# Patient Record
Sex: Female | Born: 1990 | Hispanic: Yes | Smoking: Never smoker
Health system: Southern US, Community
[De-identification: ages and names within clinical notes are randomized; demographics above are authoritative.]

## PROBLEM LIST (undated history)

## (undated) ENCOUNTER — Inpatient Hospital Stay (HOSPITAL_COMMUNITY): Payer: Self-pay

## (undated) ENCOUNTER — Emergency Department (HOSPITAL_BASED_OUTPATIENT_CLINIC_OR_DEPARTMENT_OTHER): Admission: EM | Payer: 59 | Source: Home / Self Care

## (undated) DIAGNOSIS — I10 Essential (primary) hypertension: Secondary | ICD-10-CM

## (undated) DIAGNOSIS — Z862 Personal history of diseases of the blood and blood-forming organs and certain disorders involving the immune mechanism: Secondary | ICD-10-CM

## (undated) DIAGNOSIS — D649 Anemia, unspecified: Secondary | ICD-10-CM

## (undated) DIAGNOSIS — Z8719 Personal history of other diseases of the digestive system: Secondary | ICD-10-CM

## (undated) DIAGNOSIS — Z9889 Other specified postprocedural states: Secondary | ICD-10-CM

## (undated) DIAGNOSIS — O009 Unspecified ectopic pregnancy without intrauterine pregnancy: Secondary | ICD-10-CM

## (undated) DIAGNOSIS — G44209 Tension-type headache, unspecified, not intractable: Secondary | ICD-10-CM

## (undated) HISTORY — PX: HERNIA REPAIR: SHX51

## (undated) HISTORY — DX: Essential (primary) hypertension: I10

## (undated) HISTORY — DX: Tension-type headache, unspecified, not intractable: G44.209

## (undated) HISTORY — PX: OTHER SURGICAL HISTORY: SHX169

## (undated) HISTORY — DX: Personal history of other diseases of the digestive system: Z87.19

## (undated) HISTORY — DX: Unspecified ectopic pregnancy without intrauterine pregnancy: O00.90

## (undated) HISTORY — DX: Personal history of diseases of the blood and blood-forming organs and certain disorders involving the immune mechanism: Z86.2

## (undated) HISTORY — PX: ABLATION ON ENDOMETRIOSIS: SHX5787

## (undated) HISTORY — PX: ENDOMETRIAL ABLATION: SHX621

## (undated) HISTORY — DX: Other specified postprocedural states: Z98.890

---

## 2011-01-20 NOTE — L&D Delivery Note (Signed)
Delivery Note At 5:47 AM a viable female was delivered via Vaginal, Spontaneous Delivery (Presentation: Left Occiput Anterior).  APGAR: 8, 8; weight: pending  Placenta status: Intact, Spontaneous.  Cord: 3 vessels with the following complications: None.    Anesthesia: Other  Episiotomy: None Lacerations: None Suture Repair: n/a Est. Blood Loss (mL): 300  Mom to postpartum.  Baby to nursery-stable.  Plans to breastfeed, desires BTL- aware that will not be done while inpatient unless platelets increase to >90.  Does not want circumcision.  Marge Duncans 10/18/2011, 6:19 AM

## 2011-03-30 LAB — ANTIBODY SCREEN: Antibody Screen: NEGATIVE

## 2011-03-30 LAB — CBC
HCT: 36 % (ref 36–46)
Hemoglobin: 12.4 g/dL (ref 12.0–16.0)

## 2011-03-30 LAB — ABO/RH: RH Type: POSITIVE

## 2011-03-30 LAB — GC/CHLAMYDIA PROBE AMP, GENITAL: Gonorrhea: NEGATIVE

## 2011-03-30 LAB — RUBELLA ANTIBODY, IGM: Rubella: IMMUNE

## 2011-03-30 LAB — CULTURE, OB URINE: Urine Culture, OB: NEGATIVE

## 2011-03-30 LAB — VARICELLA ZOSTER ANTIBODY, IGG: Varicella: IMMUNE

## 2011-03-30 LAB — GLUCOSE TOLERANCE, 1 HOUR: Glucose, 1 hour: 67

## 2011-03-30 LAB — HIV ANTIBODY (ROUTINE TESTING W REFLEX): HIV: NONREACTIVE

## 2011-03-30 LAB — HEPATITIS B SURFACE ANTIGEN: Hepatitis B Surface Ag: NEGATIVE

## 2011-04-07 DIAGNOSIS — O09219 Supervision of pregnancy with history of pre-term labor, unspecified trimester: Secondary | ICD-10-CM | POA: Insufficient documentation

## 2011-04-07 DIAGNOSIS — Z8751 Personal history of pre-term labor: Secondary | ICD-10-CM

## 2011-04-13 ENCOUNTER — Encounter: Payer: Self-pay | Admitting: Family

## 2011-04-13 ENCOUNTER — Ambulatory Visit (INDEPENDENT_AMBULATORY_CARE_PROVIDER_SITE_OTHER): Payer: Self-pay | Admitting: Family

## 2011-04-13 VITALS — BP 109/75 | Temp 97.9°F | Wt 123.6 lb

## 2011-04-13 DIAGNOSIS — Z8751 Personal history of pre-term labor: Secondary | ICD-10-CM

## 2011-04-13 DIAGNOSIS — O0990 Supervision of high risk pregnancy, unspecified, unspecified trimester: Secondary | ICD-10-CM | POA: Insufficient documentation

## 2011-04-13 DIAGNOSIS — O09219 Supervision of pregnancy with history of pre-term labor, unspecified trimester: Secondary | ICD-10-CM

## 2011-04-13 LAB — POCT URINALYSIS DIP (DEVICE)
Ketones, ur: NEGATIVE mg/dL
Leukocytes, UA: NEGATIVE
Nitrite: NEGATIVE
Protein, ur: NEGATIVE mg/dL
pH: 5.5 (ref 5.0–8.0)

## 2011-04-13 MED ORDER — HYDROXYPROGESTERONE CAPROATE 250 MG/ML IM OIL
250.0000 mg | TOPICAL_OIL | INTRAMUSCULAR | Status: AC
  Administered 2011-04-27 – 2011-09-14 (×21): 250 mg via INTRAMUSCULAR

## 2011-04-13 NOTE — Progress Notes (Signed)
Initial OB visit with our practice, transfer from [facility] Dept; initial OB labs were obtained, but not resulted; exam was completed (media tab); desires quad screen will obtain at next visit; reviewed 17p benefits, agrees to administration; next appt in 2 wks for 17p initiation and will schedule ultrasound.  Visit nutrition and social work today.

## 2011-04-13 NOTE — Progress Notes (Incomplete)
Nutrition Note:  (1st visit) Pt referral for 1st appt consult.  Dx. Hx of PTD (32 weeks) and LBW (4+5#). Pt has 3# wt gain at [redacted]w[redacted]d gestation which is adequate. Pt reports occasional nausea and vomiting, with a poor intake of 1 large meal daily and 2 snacks.  Pt cannot tolerate dairy products, eggs and fruits currently. Does eat meats and vegetables on a regular basis.  Disc wt gain goals of 25-35#. Pt plans to breastfeed, and does not want WIC services currently. Follow up in 4-6 weeks.  Cy Blamer, RD

## 2011-04-13 NOTE — Progress Notes (Signed)
Pain at times on left side. Pulse 94.

## 2011-04-27 ENCOUNTER — Ambulatory Visit (INDEPENDENT_AMBULATORY_CARE_PROVIDER_SITE_OTHER): Payer: Self-pay | Admitting: Obstetrics & Gynecology

## 2011-04-27 DIAGNOSIS — Z8751 Personal history of pre-term labor: Secondary | ICD-10-CM

## 2011-04-27 DIAGNOSIS — O09219 Supervision of pregnancy with history of pre-term labor, unspecified trimester: Secondary | ICD-10-CM

## 2011-04-27 LAB — POCT URINALYSIS DIP (DEVICE)
Glucose, UA: NEGATIVE mg/dL
Leukocytes, UA: NEGATIVE
Specific Gravity, Urine: 1.025 (ref 1.005–1.030)
Urobilinogen, UA: 0.2 mg/dL (ref 0.0–1.0)

## 2011-04-27 NOTE — Patient Instructions (Signed)
Tachycardia, Nonspecific  In adults, the heart normally beats between 60 and 100 times a minute. A heart rate over 100 is called tachycardia. When your heart beats too fast, it may not be able to pump enough blood to the rest of the body.  CAUSES    Exercise or exertion.   Fever.   Pain or injury.   Infection.   Loss of fluid (dehydration).   Overactive thyroid.   Lack of red blood cells (anemia).   Anxiety.   Alcohol.   Heart arrhythmia.   Caffeine.   Tobacco products.   Diet pills.   Street drugs.   Heart disease.  SYMPTOMS   Palpitations (rapid or irregular heartbeat).   Dizziness.   Tiredness (fatigue).   Shortness of breath.  DIAGNOSIS   After an exam and taking a history, your caregiver may order:   Blood tests.   Electrocardiogram (EKG).   Heart monitor.  TREATMENT   Treatment will depend on the cause and potential for harm. It may include:   Intravenous (IV) replacement of fluids or blood.   Antidote or reversal medicines.   Changes in your present medicines.   Lifestyle changes.  HOME CARE INSTRUCTIONS    Get rest.   Drink enough water and fluids to keep your urine clear or pale yellow.   Avoid:   Caffeine.   Nicotine.   Alcohol.   Stress.   Chocolate.   Stimulants.   Only take medicine as directed by your caregiver.  SEEK IMMEDIATE MEDICAL CARE IF:    You have pain in your chest, upper arms, jaw, or neck.   You become weak, dizzy, or feel faint.   You have palpitations that will not go away.   You throw up (vomit), have diarrhea, or pass blood.   You look pale and your skin is cool and wet.  MAKE SURE YOU:    Understand these instructions.   Will watch your condition.   Will get help right away if you are not doing well or get worse.  Document Released: 02/13/2004 Document Revised: 12/25/2010 Document Reviewed: 12/16/2010  Exit[employer] Patient Information 2012 [employer].

## 2011-04-27 NOTE — Progress Notes (Signed)
Start 17 p injections today. Sharp left side pain. Heart rate increased with meals, gets short of breath, was evaluated for this last pregnancy. Need records of previous eval, may need cards referral  Schedule [place] 2 weeks. Will do early 1 hr GTT

## 2011-04-27 NOTE — Progress Notes (Signed)
Addended by: Doreen Salvage on: 04/27/2011 11:21 AM   Modules accepted: Orders

## 2011-04-27 NOTE — Progress Notes (Signed)
Pt complains of abdominal pain

## 2011-04-28 LAB — GLUCOSE TOLERANCE, 1 HOUR: Glucose, 1 Hour GTT: 113 mg/dL (ref 70–140)

## 2011-05-04 ENCOUNTER — Ambulatory Visit (INDEPENDENT_AMBULATORY_CARE_PROVIDER_SITE_OTHER): Payer: Self-pay | Admitting: Obstetrics & Gynecology

## 2011-05-04 VITALS — BP 107/73 | Temp 99.1°F | Wt 126.4 lb

## 2011-05-04 DIAGNOSIS — Z8751 Personal history of pre-term labor: Secondary | ICD-10-CM

## 2011-05-04 DIAGNOSIS — O09219 Supervision of pregnancy with history of pre-term labor, unspecified trimester: Secondary | ICD-10-CM

## 2011-05-04 LAB — POCT URINALYSIS DIP (DEVICE)
Glucose, UA: NEGATIVE mg/dL
Leukocytes, UA: NEGATIVE
Nitrite: NEGATIVE
Specific Gravity, Urine: 1.02 (ref 1.005–1.030)
Urobilinogen, UA: 0.2 mg/dL (ref 0.0–1.0)

## 2011-05-04 NOTE — Progress Notes (Signed)
Continue 17P 

## 2011-05-04 NOTE — Patient Instructions (Signed)
Preventing Preterm Labor Preterm labor is when a pregnant woman has contractions that cause the cervix to open, shorten, and thin before 37 weeks of pregnancy. You will have regular contractions (tightening) 2 to 3 minutes apart. This usually causes discomfort or pain. HOME CARE  Eat a healthy diet.   Take your vitamins as told by your doctor.   Drink enough fluids to keep your pee (urine) clear or pale yellow every day.   Get rest and sleep.   Do not have sex if you are at high risk for preterm labor.   Follow your doctor's advice about activity, medicines, and tests.   Avoid stress.   Avoid hard labor or exercise that lasts for a long time.   Do not smoke.  GET HELP RIGHT AWAY IF:   You are having contractions.   You have belly (abdominal) pain.   You have bleeding from your vagina.   You have pain when you pee (urinate).   You have abnormal discharge from your vagina.   You have a temperature by mouth above 102 F (38.9 C).  MAKE SURE YOU:  Understand these instructions.   Will watch your condition.   Will get help if you are not doing well or get worse.  Document Released: 04/03/2008 Document Revised: 12/25/2010 Document Reviewed: 04/03/2008 [employer] Patient Information 2012 [employer]. 

## 2011-05-04 NOTE — Progress Notes (Signed)
Pulse: 90

## 2011-05-04 NOTE — Progress Notes (Signed)
U/S scheduled 05/11/11 at 730 am.

## 2011-05-11 ENCOUNTER — Ambulatory Visit (HOSPITAL_COMMUNITY)
Admission: RE | Admit: 2011-05-11 | Discharge: 2011-05-11 | Disposition: A | Discharge status: Home / Self Care | Payer: Medicaid Other | Source: Ambulatory Visit | Attending: Obstetrics & Gynecology | Admitting: Obstetrics & Gynecology

## 2011-05-11 ENCOUNTER — Ambulatory Visit (INDEPENDENT_AMBULATORY_CARE_PROVIDER_SITE_OTHER): Payer: Medicaid Other | Admitting: *Deleted

## 2011-05-11 VITALS — BP 103/66 | HR 88 | Temp 98.8°F | Ht 60.0 in | Wt 126.8 lb

## 2011-05-11 DIAGNOSIS — Z8751 Personal history of pre-term labor: Secondary | ICD-10-CM

## 2011-05-11 DIAGNOSIS — Z1389 Encounter for screening for other disorder: Secondary | ICD-10-CM | POA: Insufficient documentation

## 2011-05-11 DIAGNOSIS — O358XX0 Maternal care for other (suspected) fetal abnormality and damage, not applicable or unspecified: Secondary | ICD-10-CM | POA: Insufficient documentation

## 2011-05-11 DIAGNOSIS — Z363 Encounter for antenatal screening for malformations: Secondary | ICD-10-CM | POA: Insufficient documentation

## 2011-05-11 DIAGNOSIS — O09219 Supervision of pregnancy with history of pre-term labor, unspecified trimester: Secondary | ICD-10-CM

## 2011-05-12 ENCOUNTER — Encounter (HOSPITAL_COMMUNITY): Payer: Self-pay | Admitting: *Deleted

## 2011-05-12 ENCOUNTER — Inpatient Hospital Stay (HOSPITAL_COMMUNITY)
Admission: AD | Admit: 2011-05-12 | Discharge: 2011-05-12 | Disposition: A | Discharge status: Home / Self Care | Payer: Medicaid Other | Source: Ambulatory Visit | Attending: Obstetrics & Gynecology | Admitting: Obstetrics & Gynecology

## 2011-05-12 DIAGNOSIS — O99891 Other specified diseases and conditions complicating pregnancy: Secondary | ICD-10-CM | POA: Insufficient documentation

## 2011-05-12 DIAGNOSIS — R109 Unspecified abdominal pain: Secondary | ICD-10-CM | POA: Insufficient documentation

## 2011-05-12 DIAGNOSIS — M545 Low back pain, unspecified: Secondary | ICD-10-CM | POA: Insufficient documentation

## 2011-05-12 DIAGNOSIS — R197 Diarrhea, unspecified: Secondary | ICD-10-CM

## 2011-05-12 LAB — URINALYSIS, ROUTINE W REFLEX MICROSCOPIC
Bilirubin Urine: NEGATIVE
Glucose, UA: NEGATIVE mg/dL
Hgb urine dipstick: NEGATIVE
Ketones, ur: NEGATIVE mg/dL
Protein, ur: NEGATIVE mg/dL
Urobilinogen, UA: 0.2 mg/dL (ref 0.0–1.0)

## 2011-05-12 MED ORDER — DIPHENOXYLATE-ATROPINE 2.5-0.025 MG PO TABS
2.0000 | ORAL_TABLET | Freq: Once | ORAL | Status: AC
  Administered 2011-05-12: 1 via ORAL
  Filled 2011-05-12: qty 1

## 2011-05-12 MED ORDER — DIPHENOXYLATE-ATROPINE 2.5-0.025 MG PO TABS: 1.0000 | ORAL_TABLET | Freq: Four times a day (QID) | ORAL | Status: AC | PRN

## 2011-05-12 NOTE — MAU Note (Signed)
N. Frazier, CNM at bedside.  Assessment done and poc discussed with pt.  

## 2011-05-12 NOTE — MAU Note (Signed)
Only one tablet available in pyxis for lomotil.  Ok per CNM to give just one tablet and discharge home.

## 2011-05-12 NOTE — MAU Provider Note (Signed)
History     CSN: [id number]  Arrival date and time: 05/12/11 1827   First Provider Initiated Contact with Patient 05/12/11 2025      Chief Complaint  Patient presents with  . Back Pain   HPI 21 y.o. [id number] at [redacted]w[redacted]d c/o low back pain and low abd cramping starting yesterday with diarrhea, several episodes of diarrhea yesterday, 3 times this morning, none since. Not eating or drinking much today as she feels like it makes the diarrhea/cramping worse. No vaginal bleeding or discharge.    Past Medical History  Diagnosis Date  . Preterm labor     Past Surgical History  Procedure Date  . Hernia repair     Inguinal 21 yo    Family History  Problem Relation Age of Onset  . Cancer Maternal Grandmother     breast cancer  . Diabetes Maternal Grandfather     History  Substance Use Topics  . Smoking status: Never Smoker   . Smokeless tobacco: Never Used  . Alcohol Use: No    Allergies: No Known Allergies  Prescriptions prior to admission  Medication Sig Dispense Refill  . Prenatal Vit-Fe Fumarate-FA (PRENATAL MULTIVITAMIN) 60-1 MG tablet Take 1 tablet by mouth daily.        Review of Systems  Constitutional: Negative.   Respiratory: Negative.   Cardiovascular: Negative.   Gastrointestinal: Positive for abdominal pain and diarrhea. Negative for nausea, vomiting and constipation.  Genitourinary: Negative for dysuria, urgency, frequency, hematuria and flank pain.       Negative for vaginal bleeding, vaginal discharge  Musculoskeletal: Positive for back pain.  Neurological: Negative.   Psychiatric/Behavioral: Negative.    Physical Exam   Blood pressure 112/66, pulse 100, temperature 98.8 F (37.1 C), temperature source Oral, resp. rate 20, height 4\' 11"  (1.499 m), weight 126 lb (57.153 kg), last menstrual period 01/03/2011, SpO2 99.00%.  Physical Exam  Nursing note and vitals reviewed. Constitutional: She is oriented to person, place, and time. She appears  well-developed and well-nourished. No distress.  Respiratory: Effort normal.  GI: Soft. She exhibits no distension and no mass. There is no tenderness. There is no rebound, no guarding and no CVA tenderness.  Genitourinary:       SVE: long/thick/closed  Musculoskeletal: Normal range of motion.  Neurological: She is alert and oriented to person, place, and time.  Skin: Skin is warm and dry.  Psychiatric: She has a normal mood and affect.    MAU Course  Procedures  Results for orders placed during the hospital encounter of 05/12/11 (from the past 24 hour(s))  URINALYSIS, ROUTINE W REFLEX MICROSCOPIC     Status: Abnormal   Collection Time   05/12/11  7:30 PM      Component Value Range   Color, Urine YELLOW  YELLOW    APPearance CLEAR  CLEAR    Specific Gravity, Urine >1.030 (*) 1.005 - 1.030    pH 6.0  5.0 - 8.0    Glucose, UA NEGATIVE  NEGATIVE (mg/dL)   Hgb urine dipstick NEGATIVE  NEGATIVE    Bilirubin Urine NEGATIVE  NEGATIVE    Ketones, ur NEGATIVE  NEGATIVE (mg/dL)   Protein, ur NEGATIVE  NEGATIVE (mg/dL)   Urobilinogen, UA 0.2  0.0 - 1.0 (mg/dL)   Nitrite NEGATIVE  NEGATIVE    Leukocytes, UA NEGATIVE  NEGATIVE      Assessment and Plan  21 y.o. [id number] at [redacted]w[redacted]d Diarrhea - rx lomotil, encouraged PO hydration, BRAT diet F/U as scheduled or  sooner PRN  Haasini Patnaude 05/12/2011, 8:28 PM

## 2011-05-12 NOTE — MAU Note (Signed)
Pt states that lower back pain  Started yesterday that has gotten worse and worse-today-she feels the pain  In her lower abdomen-feels like cramping

## 2011-05-13 ENCOUNTER — Encounter (HOSPITAL_COMMUNITY): Payer: Self-pay | Admitting: Advanced Practice Midwife

## 2011-05-18 ENCOUNTER — Ambulatory Visit (INDEPENDENT_AMBULATORY_CARE_PROVIDER_SITE_OTHER): Payer: Medicaid Other

## 2011-05-18 VITALS — BP 115/75 | HR 90

## 2011-05-18 DIAGNOSIS — Z8751 Personal history of pre-term labor: Secondary | ICD-10-CM

## 2011-05-18 DIAGNOSIS — O09219 Supervision of pregnancy with history of pre-term labor, unspecified trimester: Secondary | ICD-10-CM

## 2011-05-25 ENCOUNTER — Encounter: Payer: Self-pay | Admitting: Family Medicine

## 2011-05-25 ENCOUNTER — Ambulatory Visit (INDEPENDENT_AMBULATORY_CARE_PROVIDER_SITE_OTHER): Payer: Medicaid Other | Admitting: Family Medicine

## 2011-05-25 DIAGNOSIS — O09219 Supervision of pregnancy with history of pre-term labor, unspecified trimester: Secondary | ICD-10-CM

## 2011-05-25 NOTE — Progress Notes (Signed)
Pulse: 86

## 2011-05-25 NOTE — Patient Instructions (Addendum)
Pregnancy - Second Trimester The second trimester of pregnancy (3 to 6 months) is a period of rapid growth for you and your baby. At the end of the sixth month, your baby is about 9 inches long and weighs 1 1/2 pounds. You will begin to feel the baby move between 18 and 20 weeks of the pregnancy. This is called quickening. Weight gain is faster. A clear fluid (colostrum) may leak out of your breasts. You may feel small contractions of the womb (uterus). This is known as false labor or Braxton-Hicks contractions. This is like a practice for labor when the baby is ready to be born. Usually, the problems with morning sickness have usually passed by the end of your first trimester. Some women develop small dark blotches (called cholasma, mask of pregnancy) on their face that usually goes away after the baby is born. Exposure to the sun makes the blotches worse. Acne may also develop in some pregnant women and pregnant women who have acne, may find that it goes away. PRENATAL EXAMS  Blood work may continue to be done during prenatal exams. These tests are done to check on your health and the probable health of your baby. Blood work is used to follow your blood levels (hemoglobin). Anemia (low hemoglobin) is common during pregnancy. Iron and vitamins are given to help prevent this. You will also be checked for diabetes between 24 and 28 weeks of the pregnancy. Some of the previous blood tests may be repeated.   The size of the uterus is measured during each visit. This is to make sure that the baby is continuing to grow properly according to the dates of the pregnancy.   Your blood pressure is checked every prenatal visit. This is to make sure you are not getting toxemia.   Your urine is checked to make sure you do not have an infection, diabetes or protein in the urine.   Your weight is checked often to make sure gains are happening at the suggested rate. This is to ensure that both you and your baby are  growing normally.   Sometimes, an ultrasound is performed to confirm the proper growth and development of the baby. This is a test which bounces harmless sound waves off the baby so your caregiver can more accurately determine due dates.  Sometimes, a specialized test is done on the amniotic fluid surrounding the baby. This test is called an amniocentesis. The amniotic fluid is obtained by sticking a needle into the belly (abdomen). This is done to check the chromosomes in instances where there is a concern about possible genetic problems with the baby. It is also sometimes done near the end of pregnancy if an early delivery is required. In this case, it is done to help make sure the baby's lungs are mature enough for the baby to live outside of the womb. CHANGES OCCURING IN THE SECOND TRIMESTER OF PREGNANCY Your body goes through many changes during pregnancy. They vary from person to person. Talk to your caregiver about changes you notice that you are concerned about.  During the second trimester, you will likely have an increase in your appetite. It is normal to have cravings for certain foods. This varies from person to person and pregnancy to pregnancy.   Your lower abdomen will begin to bulge.   You may have to urinate more often because the uterus and baby are pressing on your bladder. It is also common to get more bladder infections during pregnancy (  pain with urination). You can help this by drinking lots of fluids and emptying your bladder before and after intercourse.   You may begin to get stretch marks on your hips, abdomen, and breasts. These are normal changes in the body during pregnancy. There are no exercises or medications to take that prevent this change.   You may begin to develop swollen and bulging veins (varicose veins) in your legs. Wearing support hose, elevating your feet for 15 minutes, 3 to 4 times a day and limiting salt in your diet helps lessen the problem.    Heartburn may develop as the uterus grows and pushes up against the stomach. Antacids recommended by your caregiver helps with this problem. Also, eating smaller meals 4 to 5 times a day helps.   Constipation can be treated with a stool softener or adding bulk to your diet. Drinking lots of fluids, vegetables, fruits, and whole grains are helpful.   Exercising is also helpful. If you have been very active up until your pregnancy, most of these activities can be continued during your pregnancy. If you have been less active, it is helpful to start an exercise program such as walking.   Hemorrhoids (varicose veins in the rectum) may develop at the end of the second trimester. Warm sitz baths and hemorrhoid cream recommended by your caregiver helps hemorrhoid problems.   Backaches may develop during this time of your pregnancy. Avoid heavy lifting, wear low heal shoes and practice good posture to help with backache problems.   Some pregnant women develop tingling and numbness of their hand and fingers because of swelling and tightening of ligaments in the wrist (carpel tunnel syndrome). This goes away after the baby is born.   As your breasts enlarge, you may have to get a bigger bra. Get a comfortable, cotton, support bra. Do not get a nursing bra until the last month of the pregnancy if you will be nursing the baby.   You may get a dark line from your belly button to the pubic area called the linea nigra.   You may develop rosy cheeks because of increase blood flow to the face.   You may develop spider looking lines of the face, neck, arms and chest. These go away after the baby is born.  HOME CARE INSTRUCTIONS   It is extremely important to avoid all smoking, herbs, alcohol, and unprescribed drugs during your pregnancy. These chemicals affect the formation and growth of the baby. Avoid these chemicals throughout the pregnancy to ensure the delivery of a healthy infant.   Most of your home  care instructions are the same as suggested for the first trimester of your pregnancy. Keep your caregiver's appointments. Follow your caregiver's instructions regarding medication use, exercise and diet.   During pregnancy, you are providing food for you and your baby. Continue to eat regular, well-balanced meals. Choose foods such as meat, fish, milk and other low fat dairy products, vegetables, fruits, and whole-grain breads and cereals. Your caregiver will tell you of the ideal weight gain.   A physical sexual relationship may be continued up until near the end of pregnancy if there are no other problems. Problems could include early (premature) leaking of amniotic fluid from the membranes, vaginal bleeding, abdominal pain, or other medical or pregnancy problems.   Exercise regularly if there are no restrictions. Check with your caregiver if you are unsure of the safety of some of your exercises. The greatest weight gain will occur in the   last 2 trimesters of pregnancy. Exercise will help you:   Control your weight.   Get you in shape for labor and delivery.   Lose weight after you have the baby.   Wear a good support or jogging bra for breast tenderness during pregnancy. This may help if worn during sleep. Pads or tissues may be used in the bra if you are leaking colostrum.   Do not use hot tubs, steam rooms or saunas throughout the pregnancy.   Wear your seat belt at all times when driving. This protects you and your baby if you are in an accident.   Avoid raw meat, uncooked cheese, cat litter boxes and soil used by cats. These carry germs that can cause birth defects in the baby.   The second trimester is also a good time to visit your dentist for your dental health if this has not been done yet. Getting your teeth cleaned is OK. Use a soft toothbrush. Brush gently during pregnancy.   It is easier to loose urine during pregnancy. Tightening up and strengthening the pelvic muscles will  help with this problem. Practice stopping your urination while you are going to the bathroom. These are the same muscles you need to strengthen. It is also the muscles you would use as if you were trying to stop from passing gas. You can practice tightening these muscles up 10 times a set and repeating this about 3 times per day. Once you know what muscles to tighten up, do not perform these exercises during urination. It is more likely to contribute to an infection by backing up the urine.   Ask for help if you have financial, counseling or nutritional needs during pregnancy. Your caregiver will be able to offer counseling for these needs as well as refer you for other special needs.   Your skin may become oily. If so, wash your face with mild soap, use non-greasy moisturizer and oil or cream based makeup.  MEDICATIONS AND DRUG USE IN PREGNANCY  Take prenatal vitamins as directed. The vitamin should contain 1 milligram of folic acid. Keep all vitamins out of reach of children. Only a couple vitamins or tablets containing iron may be fatal to a baby or young child when ingested.   Avoid use of all medications, including herbs, over-the-counter medications, not prescribed or suggested by your caregiver. Only take over-the-counter or prescription medicines for pain, discomfort, or fever as directed by your caregiver. Do not use aspirin.   Let your caregiver also know about herbs you may be using.   Alcohol is related to a number of birth defects. This includes fetal alcohol syndrome. All alcohol, in any form, should be avoided completely. Smoking will cause low birth rate and premature babies.   Street or illegal drugs are very harmful to the baby. They are absolutely forbidden. A baby born to an addicted mother will be addicted at birth. The baby will go through the same withdrawal an adult does.  SEEK MEDICAL CARE IF:  You have any concerns or worries during your pregnancy. It is better to call with  your questions if you feel they cannot wait, rather than worry about them. SEEK IMMEDIATE MEDICAL CARE IF:   An unexplained oral temperature above 102 F (38.9 C) develops, or as your caregiver suggests.   You have leaking of fluid from the vagina (birth canal). If leaking membranes are suspected, take your temperature and tell your caregiver of this when you call.   There   is vaginal spotting, bleeding, or passing clots. Tell your caregiver of the amount and how many pads are used. Light spotting in pregnancy is common, especially following intercourse.   You develop a bad smelling vaginal discharge with a change in the color from clear to white.   You continue to feel sick to your stomach (nauseated) and have no relief from remedies suggested. You vomit blood or coffee ground-like materials.   You lose more than 2 pounds of weight or gain more than 2 pounds of weight over 1 week, or as suggested by your caregiver.   You notice swelling of your face, hands, feet, or legs.   You get exposed to German measles and have never had them.   You are exposed to fifth disease or chickenpox.   You develop belly (abdominal) pain. Round ligament discomfort is a common non-cancerous (benign) cause of abdominal pain in pregnancy. Your caregiver still must evaluate you.   You develop a bad headache that does not go away.   You develop fever, diarrhea, pain with urination, or shortness of breath.   You develop visual problems, blurry, or double vision.   You fall or are in a car accident or any kind of trauma.   There is mental or physical violence at home.  Document Released: 12/30/2000 Document Revised: 12/25/2010 Document Reviewed: 07/04/2008 [employer] Patient Information 2012 [employer]. Breastfeeding BENEFITS OF BREASTFEEDING For the baby  The first milk (colostrum) helps the baby's digestive system function better.   There are antibodies from the mother in the milk that help the  baby fight off infections.   The baby has a lower incidence of asthma, allergies, and SIDS (sudden infant death syndrome).   The nutrients in breast milk are better than formulas for the baby and helps the baby's brain grow better.   Babies who breastfeed have less gas, colic, and constipation.  For the mother  Breastfeeding helps develop a very special bond between mother and baby.   It is more convenient, always available at the correct temperature and cheaper than formula feeding.   It burns calories in the mother and helps with losing weight that was gained during pregnancy.   It makes the uterus contract back down to normal size faster and slows bleeding following delivery.   Breastfeeding mothers have a lower risk of developing breast cancer.  NURSE FREQUENTLY  A healthy, full-term baby may breastfeed as often as every hour or space his or her feedings to every 3 hours.   How often to nurse will vary from baby to baby. Watch your baby for signs of hunger, not the clock.   Nurse as often as the baby requests, or when you feel the need to reduce the fullness of your breasts.   Awaken the baby if it has been 3 to 4 hours since the last feeding.   Frequent feeding will help the mother make more milk and will prevent problems like sore nipples and engorgement of the breasts.  BABY'S POSITION AT THE BREAST  Whether lying down or sitting, be sure that the baby's tummy is facing your tummy.   Support the breast with 4 fingers underneath the breast and the thumb above. Make sure your fingers are well away from the nipple and baby's mouth.   Stroke the baby's lips and cheek closest to the breast gently with your finger or nipple.   When the baby's mouth is open wide enough, place all of your nipple and as much   of the dark area around the nipple as possible into your baby's mouth.   Pull the baby in close so the tip of the nose and the baby's cheeks touch the breast during the  feeding.  FEEDINGS  The length of each feeding varies from baby to baby and from feeding to feeding.   The baby must suck about 2 to 3 minutes for your milk to get to him or her. This is called a "let down." For this reason, allow the baby to feed on each breast as long as he or she wants. Your baby will end the feeding when he or she has received the right balance of nutrients.   To break the suction, put your finger into the corner of the baby's mouth and slide it between his or her gums before removing your breast from his or her mouth. This will help prevent sore nipples.  REDUCING BREAST ENGORGEMENT  In the first week after your baby is born, you may experience signs of breast engorgement. When breasts are engorged, they feel heavy, warm, full, and may be tender to the touch. You can reduce engorgement if you:   Nurse frequently, every 2 to 3 hours. Mothers who breastfeed early and often have fewer problems with engorgement.   Place light ice packs on your breasts between feedings. This reduces swelling. Wrap the ice packs in a lightweight towel to protect your skin.   Apply moist hot packs to your breast for 5 to 10 minutes before each feeding. This increases circulation and helps the milk flow.   Gently massage your breast before and during the feeding.   Make sure that the baby empties at least one breast at every feeding before switching sides.   Use a breast pump to empty the breasts if your baby is sleepy or not nursing well. You may also want to pump if you are returning to work or or you feel you are getting engorged.   Avoid bottle feeds, pacifiers or supplemental feedings of water or juice in place of breastfeeding.   Be sure the baby is latched on and positioned properly while breastfeeding.   Prevent fatigue, stress, and anemia.   Wear a supportive bra, avoiding underwire styles.   Eat a balanced diet with enough fluids.  If you follow these suggestions, your  engorgement should improve in 24 to 48 hours. If you are still experiencing difficulty, call your lactation consultant or caregiver. IS MY BABY GETTING ENOUGH MILK? Sometimes, mothers worry about whether their babies are getting enough milk. You can be assured that your baby is getting enough milk if:  The baby is actively sucking and you hear swallowing.   The baby nurses at least 8 to 12 times in a 24 hour time period. Nurse your baby until he or she unlatches or falls asleep at the first breast (at least 10 to 20 minutes), then offer the second side.   The baby is wetting 5 to 6 disposable diapers (6 to 8 cloth diapers) in a 24 hour period by 5 to 6 days of age.   The baby is having at least 2 to 3 stools every 24 hours for the first few months. Breast milk is all the food your baby needs. It is not necessary for your baby to have water or formula. In fact, to help your breasts make more milk, it is best not to give your baby supplemental feedings during the early weeks.   The stool should   be soft and yellow.   The baby should gain 4 to 7 ounces per week after he is 4 days old.  TAKE CARE OF YOURSELF Take care of your breasts by:  Bathing or showering daily.   Avoiding the use of soaps on your nipples.   Start feedings on your left breast at one feeding and on your right breast at the next feeding.   You will notice an increase in your milk supply 2 to 5 days after delivery. You may feel some discomfort from engorgement, which makes your breasts very firm and often tender. Engorgement "peaks" out within 24 to 48 hours. In the meantime, apply warm moist towels to your breasts for 5 to 10 minutes before feeding. Gentle massage and expression of some milk before feeding will soften your breasts, making it easier for your baby to latch on. Wear a well fitting nursing bra and air dry your nipples for 10 to 15 minutes after each feeding.   Only use cotton bra pads.   Only use pure lanolin on  your nipples after nursing. You do not need to wash it off before nursing.  Take care of yourself by:   Eating well-balanced meals and nutritious snacks.   Drinking milk, fruit juice, and water to satisfy your thirst (about 8 glasses a day).   Getting plenty of rest.   Increasing calcium in your diet (1200 mg a day).   Avoiding foods that you notice affect the baby in a bad way.  SEEK MEDICAL CARE IF:   You have any questions or difficulty with breastfeeding.   You need help.   You have a hard, red, sore area on your breast, accompanied by a fever of 100.5 F (38.1 C) or more.   Your baby is too sleepy to eat well or is having trouble sleeping.   Your baby is wetting less than 6 diapers per day, by 5 days of age.   Your baby's skin or white part of his or her eyes is more yellow than it was in the hospital.   You feel depressed.  Document Released: 01/05/2005 Document Revised: 12/25/2010 Document Reviewed: 08/20/2008 [employer] Patient Information 2012 [employer]. 

## 2011-05-25 NOTE — Progress Notes (Signed)
17p not given today; it was ordered but not arrived yet. Per Felicity Coyer. Patient will be called when it arrives which should be by this week. Patient instructed that if she does not hear from [place] by this Thursday, to give [place] a call. Patient states understanding.

## 2011-05-26 ENCOUNTER — Ambulatory Visit (INDEPENDENT_AMBULATORY_CARE_PROVIDER_SITE_OTHER): Payer: Medicaid Other | Admitting: *Deleted

## 2011-05-26 VITALS — BP 118/73 | HR 84 | Temp 98.0°F

## 2011-05-26 DIAGNOSIS — Z8751 Personal history of pre-term labor: Secondary | ICD-10-CM

## 2011-05-26 DIAGNOSIS — O09219 Supervision of pregnancy with history of pre-term labor, unspecified trimester: Secondary | ICD-10-CM

## 2011-06-01 ENCOUNTER — Ambulatory Visit (INDEPENDENT_AMBULATORY_CARE_PROVIDER_SITE_OTHER): Payer: Medicaid Other

## 2011-06-01 VITALS — BP 113/73 | HR 91 | Temp 97.2°F | Ht 59.0 in | Wt 131.1 lb

## 2011-06-01 DIAGNOSIS — O09219 Supervision of pregnancy with history of pre-term labor, unspecified trimester: Secondary | ICD-10-CM

## 2011-06-01 DIAGNOSIS — Z8751 Personal history of pre-term labor: Secondary | ICD-10-CM

## 2011-06-08 ENCOUNTER — Ambulatory Visit (INDEPENDENT_AMBULATORY_CARE_PROVIDER_SITE_OTHER): Payer: Medicaid Other | Admitting: Obstetrics & Gynecology

## 2011-06-08 VITALS — BP 110/69 | Temp 99.5°F | Wt 131.7 lb

## 2011-06-08 DIAGNOSIS — O09219 Supervision of pregnancy with history of pre-term labor, unspecified trimester: Secondary | ICD-10-CM

## 2011-06-08 DIAGNOSIS — Z8751 Personal history of pre-term labor: Secondary | ICD-10-CM

## 2011-06-08 LAB — POCT URINALYSIS DIP (DEVICE)
Ketones, ur: NEGATIVE mg/dL
Leukocytes, UA: NEGATIVE
Nitrite: NEGATIVE
Protein, ur: NEGATIVE mg/dL
Urobilinogen, UA: 0.2 mg/dL (ref 0.0–1.0)
pH: 6 (ref 5.0–8.0)

## 2011-06-08 NOTE — Progress Notes (Signed)
Pt complaining of pressure.  Cervix closed, long with tone.  17 p today and next week (RN visit only)

## 2011-06-08 NOTE — Progress Notes (Signed)
Pelvic pressure. No vaginal discharge. Pulse 90.

## 2011-06-16 ENCOUNTER — Ambulatory Visit (INDEPENDENT_AMBULATORY_CARE_PROVIDER_SITE_OTHER): Payer: Medicaid Other | Admitting: *Deleted

## 2011-06-16 VITALS — BP 111/72 | HR 88 | Temp 97.8°F | Wt 130.5 lb

## 2011-06-16 DIAGNOSIS — O09219 Supervision of pregnancy with history of pre-term labor, unspecified trimester: Secondary | ICD-10-CM

## 2011-06-16 DIAGNOSIS — Z8751 Personal history of pre-term labor: Secondary | ICD-10-CM

## 2011-06-22 ENCOUNTER — Ambulatory Visit (INDEPENDENT_AMBULATORY_CARE_PROVIDER_SITE_OTHER): Payer: Medicaid Other | Admitting: *Deleted

## 2011-06-22 VITALS — BP 118/69 | HR 104 | Temp 98.1°F | Ht 59.0 in | Wt 131.9 lb

## 2011-06-22 DIAGNOSIS — O09219 Supervision of pregnancy with history of pre-term labor, unspecified trimester: Secondary | ICD-10-CM

## 2011-06-22 DIAGNOSIS — Z8751 Personal history of pre-term labor: Secondary | ICD-10-CM

## 2011-06-29 ENCOUNTER — Ambulatory Visit (INDEPENDENT_AMBULATORY_CARE_PROVIDER_SITE_OTHER): Payer: Medicaid Other | Admitting: Obstetrics & Gynecology

## 2011-06-29 VITALS — BP 115/77 | Temp 97.6°F | Wt 132.7 lb

## 2011-06-29 DIAGNOSIS — Z23 Encounter for immunization: Secondary | ICD-10-CM

## 2011-06-29 DIAGNOSIS — Z8751 Personal history of pre-term labor: Secondary | ICD-10-CM

## 2011-06-29 DIAGNOSIS — O09219 Supervision of pregnancy with history of pre-term labor, unspecified trimester: Secondary | ICD-10-CM

## 2011-06-29 LAB — POCT URINALYSIS DIP (DEVICE)
Glucose, UA: NEGATIVE mg/dL
Nitrite: NEGATIVE
Protein, ur: NEGATIVE mg/dL
Specific Gravity, Urine: 1.025 (ref 1.005–1.030)
Urobilinogen, UA: 0.2 mg/dL (ref 0.0–1.0)

## 2011-06-29 LAB — CBC
MCV: 87.2 fL (ref 78.0–100.0)
Platelets: 117 10*3/uL — ABNORMAL LOW (ref 150–400)
RBC: 3.66 MIL/uL — ABNORMAL LOW (ref 3.87–5.11)
RDW: 14 % (ref 11.5–15.5)
WBC: 8.2 10*3/uL (ref 4.0–10.5)

## 2011-06-29 MED ORDER — TETANUS-DIPHTH-ACELL PERTUSSIS 5-2.5-18.5 LF-MCG/0.5 IM SUSP
0.5000 mL | Freq: Once | INTRAMUSCULAR | Status: AC
  Administered 2011-06-29: 0.5 mL via INTRAMUSCULAR

## 2011-06-29 NOTE — Progress Notes (Signed)
Addended by: Adam Phenix on: 06/29/2011 10:00 AM   Modules accepted: Level of Service

## 2011-06-29 NOTE — Progress Notes (Signed)
Addended by: Doreen Salvage on: 06/29/2011 10:54 AM   Modules accepted: Orders

## 2011-06-29 NOTE — Progress Notes (Signed)
17P weekly, no problems. Sign medicaid papers for sterilization today, procedure and risks discussed.

## 2011-06-29 NOTE — Progress Notes (Signed)
Pulse: 93

## 2011-06-29 NOTE — Patient Instructions (Signed)
Postpartum Tubal Ligation A postpartum tubal ligation (PPTL) is when the fallopian tubes are tied after a pregnancy. The fallopian tubes carry the eggs from the ovary to the uterus. A PPTL procedure is done to permanently prevent pregnancy (sterilization). Although this procedure may be reversed, it should be considered permanent and irreversible. This means they cannot be repaired. You should consider that you will never have children again.  This decision should be thought over carefully and discussed with your partner. Discuss it while you are pregnant in order to make a more sound and intelligent decision, rather than waiting until the baby is born. It may be good to wait until a day or two after the birth to make sure everything is all right with the baby. You must be absolutely sure you do not want another pregnancy. PPTL should be delayed if any medical problems develop during labor and delivery with the mother or the baby. RISKS AND COMPLICATIONS  Infection. A germ starts growing in the wound. This can usually be treated with antibiotics.   Fever.   Bleeding is a complication of almost all surgeries. However, it is not common following this surgery.   Pregnancy. This may happen when the body repairs itself and the tubes or one of the tubes is again able to transport an egg to the uterus. This may also occur as the result of a surgical failure. All surgeries, regardless of how perfectly they are done, are not always successful with perfect results.   Tubal pregnancy. A tubal pregnancy may occur following a tubal ligation when the tube has repaired enough to transport an egg. Because the tube has been damaged by surgery, it is more likely that the fertilized egg can implant in the tube. A tubal pregnancy can be life-threatening.   Injury to surrounding organs and blood vessels.   There is an increase incidence of hysterectomy later in life. The reason is unknown.   Regret is a late  complication. You may wish to carry another pregnancy again. Chances of this can be lessened by careful decision making before the procedure. All possibilities should be thought of. This includes the things you do not like to think about such as divorce, loss of your spouse, death or loss of your children.  Women who regret having a tubal ligation and wish to become pregnant again have a couple of choices that include:  Reversing the tubal ligation, untying and connecting the tubes again. However:   There is a higher risk of a tubal pregnancy.   It is not always successful.   In vitro fertilization. However, it is:   Very complicated and demanding on the patient.   Not always successful.  PROCEDURE  The PPTL is a surgical procedure. This procedure can be safely performed right after delivery or the day after delivery.   If it is done following a vaginal delivery, it can be done through a small cut (incision) just beneath the belly button. A medicine will be used that numbs the area or puts you to sleep (anesthetic).   If a caesarean section is performed, the decision on whether to have a tubal ligation is usually made before the surgery. This is so the tubal ligation may be done at the same time, after delivery of the baby.   The fallopian tubes are tied off with 2 stitches (sutures).   A small piece of the tube is removed and then looked at under a microscope to make sure it is   the tube.  Different procedures are available for performing the surgery on the tubes. Your surgeon will discuss the pros and cons for PPTL. A PPTL does not require a longer hospital stay. Document Released: 01/05/2005 Document Revised: 12/25/2010 Document Reviewed: 04/25/2008 [employer] Patient Information 2012 [employer]. 

## 2011-06-30 LAB — RPR

## 2011-07-07 ENCOUNTER — Ambulatory Visit (INDEPENDENT_AMBULATORY_CARE_PROVIDER_SITE_OTHER): Payer: Medicaid Other

## 2011-07-07 VITALS — BP 105/69 | HR 86 | Ht 61.0 in | Wt 134.1 lb

## 2011-07-07 DIAGNOSIS — Z8751 Personal history of pre-term labor: Secondary | ICD-10-CM

## 2011-07-07 DIAGNOSIS — O09219 Supervision of pregnancy with history of pre-term labor, unspecified trimester: Secondary | ICD-10-CM

## 2011-07-10 ENCOUNTER — Encounter (HOSPITAL_COMMUNITY): Payer: Self-pay | Admitting: *Deleted

## 2011-07-10 ENCOUNTER — Inpatient Hospital Stay (HOSPITAL_COMMUNITY)
Admission: AD | Admit: 2011-07-10 | Discharge: 2011-07-10 | Disposition: A | Discharge status: Home / Self Care | Payer: Medicaid Other | Source: Ambulatory Visit | Attending: Obstetrics and Gynecology | Admitting: Obstetrics and Gynecology

## 2011-07-10 DIAGNOSIS — O479 False labor, unspecified: Secondary | ICD-10-CM

## 2011-07-10 DIAGNOSIS — O4700 False labor before 37 completed weeks of gestation, unspecified trimester: Secondary | ICD-10-CM | POA: Insufficient documentation

## 2011-07-10 DIAGNOSIS — R109 Unspecified abdominal pain: Secondary | ICD-10-CM | POA: Insufficient documentation

## 2011-07-10 HISTORY — DX: Anemia, unspecified: D64.9

## 2011-07-10 MED ORDER — NALBUPHINE SYRINGE 5 MG/0.5 ML
5.0000 mg | INJECTION | INTRAMUSCULAR | Status: AC
  Administered 2011-07-10: 5 mg via INTRAVENOUS
  Filled 2011-07-10: qty 0.5

## 2011-07-10 MED ORDER — ZOLPIDEM TARTRATE ER 12.5 MG PO TBCR: 12.5000 mg | EXTENDED_RELEASE_TABLET | Freq: Every evening | ORAL | Status: DC | PRN

## 2011-07-10 NOTE — Discharge Instructions (Signed)
Pelvic Rest Pelvic rest is sometimes recommended for women when:   The placenta is partially or completely covering the opening of the cervix (placenta previa).   There is bleeding between the uterine wall and the amniotic sac in the first trimester (subchorionic hemorrhage).   The cervix begins to open without labor starting (incompetent cervix, cervical insufficiency).   The labor is too early (preterm labor).  HOME CARE INSTRUCTIONS  Do not have sexual intercourse, stimulation, or an orgasm.   Do not use tampons, douche, or put anything in the vagina.   Do not lift anything over 10 pounds (4.5 kg).   Avoid strenuous activity or straining your pelvic muscles.  SEEK MEDICAL CARE IF:  You have any vaginal bleeding during pregnancy. Treat this as a potential emergency.   You have cramping pain felt low in the stomach (stronger than menstrual cramps).   You notice vaginal discharge (watery, mucus, or bloody).   You have a low, dull backache.   There are regular contractions or uterine tightening.  SEEK IMMEDIATE MEDICAL CARE IF: You have vaginal bleeding and have placenta previa.  Document Released: 05/02/2010 Document Revised: 12/25/2010 Document Reviewed: 05/02/2010 [facility] Patient Information 2012 [place], [place].

## 2011-07-10 NOTE — MAU Provider Note (Signed)
History     CSN: [id number]  Arrival date and time: 07/10/11 1012   None     Chief Complaint  Patient presents with  . Abdominal Cramping   HPI  21yo female presents with contractions.  Of note, she is receiving 17P injections for a history of preterm birth.  Contractions started yesterday at about 7 pm.  They have become more intense with time.  She has not timed them and doesn't really have a sense of how far apart they were or are now.  She feels they are regular.  No vaginal bleeding or loss of fluid.  Has not had anything in her vagina for >3 days.  OB History    Grav Para Term Preterm Abortions TAB SAB Ect Mult Living   4 2 1 1 1  1   2       Past Medical History  Diagnosis Date  . Preterm labor   . Anemia     Past Surgical History  Procedure Date  . Hernia repair     Inguinal 21 yo    Family History  Problem Relation Age of Onset  . Cancer Maternal Grandmother     breast cancer  . Diabetes Maternal Grandfather   . Other Neg Hx     History  Substance Use Topics  . Smoking status: Never Smoker   . Smokeless tobacco: Never Used  . Alcohol Use: No    Allergies: No Known Allergies  Prescriptions prior to admission  Medication Sig Dispense Refill  . Prenatal Vit-Fe Fumarate-FA (PRENATAL MULTIVITAMIN) 60-1 MG tablet Take 1 tablet by mouth daily.        Review of Systems  Constitutional: Negative for fever, chills and diaphoresis.  HENT: Negative for congestion and sore throat.   Eyes: Negative for blurred vision and double vision.  Respiratory: Negative for cough and shortness of breath.   Cardiovascular: Negative for chest pain and palpitations.  Gastrointestinal: Positive for abdominal pain (No abdominal pain between contractions). Negative for heartburn, nausea, vomiting, diarrhea and constipation.  Genitourinary: Negative for dysuria and hematuria.  Skin: Negative for itching and rash.  Neurological: Positive for headaches (Mild headache at  times.  No headache now.). Negative for dizziness, tingling, sensory change, focal weakness, seizures and loss of consciousness.  Psychiatric/Behavioral: Negative for depression. The patient is not nervous/anxious.    Physical Exam   Blood pressure 111/72, pulse 88, temperature 98.5 F (36.9 C), temperature source Oral, resp. rate 18, height 4' 11.5" (1.511 m), weight 60.51 kg (133 lb 6.4 oz), last menstrual period 01/03/2011, SpO2 100.00%.  Physical Exam  Constitutional: She is oriented to person, place, and time. She appears well-developed and well-nourished. No distress.  HENT:  Head: Normocephalic and atraumatic.  Eyes: Conjunctivae are normal. Right eye exhibits no discharge. Left eye exhibits no discharge. No scleral icterus.  Neck: No tracheal deviation present.  Cardiovascular: Normal rate, regular rhythm and normal heart sounds.   Respiratory: Effort normal and breath sounds normal. No stridor.  GI: Soft. She exhibits no distension. There is no tenderness. There is no rebound and no guarding.       Gravid  Genitourinary:       SE: vagina and labia normal.  Cervix normal.   CE: closed, long, firm, posterior.  +3 station. Unable to determine presenting part.  Neurological: She is alert and oriented to person, place, and time.  Skin: Skin is warm and dry.  Psychiatric: She has a normal mood and affect. Her behavior  is normal. Judgment and thought content normal.    MAU Course  Procedures  Assessment and Plan  Braxton Hicks contractions Therapeutic rest.  CRANSTOUN, LANDI 07/10/2011, 11:06 AM   Pt seen by me also. Agree with assessment and plan. Nubain inadvertently ordered IV, but meant to be given IM, and was given IM.

## 2011-07-10 NOTE — MAU Note (Signed)
Patient states she has been having lower abdominal cramping and pressure since yesterday. Denies any bleeding or leaking and reports good fetal movement.

## 2011-07-10 NOTE — MAU Note (Signed)
Hx  Of PTL and delivery, on 17P injections.  Pains started yesterday, getting stronger and closer

## 2011-07-10 NOTE — MAU Note (Signed)
Feeling better, more relaxed, pains are not as intense or as often.

## 2011-07-13 ENCOUNTER — Ambulatory Visit (INDEPENDENT_AMBULATORY_CARE_PROVIDER_SITE_OTHER): Payer: Medicaid Other | Admitting: Advanced Practice Midwife

## 2011-07-13 VITALS — BP 106/76 | Temp 98.4°F | Wt 135.4 lb

## 2011-07-13 DIAGNOSIS — O0990 Supervision of high risk pregnancy, unspecified, unspecified trimester: Secondary | ICD-10-CM

## 2011-07-13 DIAGNOSIS — Z8751 Personal history of pre-term labor: Secondary | ICD-10-CM

## 2011-07-13 DIAGNOSIS — O09219 Supervision of pregnancy with history of pre-term labor, unspecified trimester: Secondary | ICD-10-CM

## 2011-07-13 LAB — POCT URINALYSIS DIP (DEVICE)
Glucose, UA: NEGATIVE mg/dL
Hgb urine dipstick: NEGATIVE
Leukocytes, UA: NEGATIVE
Nitrite: NEGATIVE
Urobilinogen, UA: 0.2 mg/dL (ref 0.0–1.0)
pH: 6.5 (ref 5.0–8.0)

## 2011-07-13 NOTE — MAU Provider Note (Signed)
Agree with above note.  Michelle Macias 07/13/2011 1:14 PM

## 2011-07-13 NOTE — Progress Notes (Signed)
Seen in MAU 07/10/11. Long and closed. UC's significantly decreased. Passed mucus plug, No LOF. 17-P today. Watch fundal height.

## 2011-07-13 NOTE — Patient Instructions (Addendum)
Sterilization, Women Sterilization is a surgical procedure. This surgery permanently prevents pregnancy in women. This can be done by tying (with or without cutting) the fallopian tubes or burning the tubes closed (tubal ligation). Tubal ligation blocks the tubes and prevents the egg from being fertilized by the sperm. Sterilization can be done by removing the ovaries that produce the egg (castration) as well. Sterilization is considered safe with very rare complications. It does not affect menstrual periods, sexual desire, or performance.  Since sterilization is considered permanent, you should not do it until you are sure you do not want to have more children. You and your partner should fully agree to have the procedure. Your decision to have the procedure should not be made when you are in a stressful situation. This can include a loss of a pregnancy, illness or death of a spouse, or divorce. There are other means of preventing unwanted pregnancies that can be used until you are completely sure you want to be sterilized. Sterilization does not protect against sexually transmitted disease. Women who had a sterilization procedure and want it reversed must know that it requires an expensive and major operation. The reversal may not be successful and has a high rate of tubal (ectopic) pregnancy that can be dangerous and require surgery. There are several ways to perform a tubal sterlization:  Laparoscopy. The abdomen is filled with a gas to see the pelvic organs. Then, a tube with a light attached is inserted into the abdomen through 2 small incisions. The fallopian tubes are blocked with a ring, clip or electrocautery to burn closed the tubes. Then, the gas is released and the small incisions are closed.   Hysteroscopy. A tube with a light is inserted in the vagina, through the cervix and then into the uterus. A spring-like instrument is inserted into the opening of the fallopian tubes. The spring causes  scaring and blocks the tubes. Other forms of contraception should be used for three months at which time an X-ray is done to be sure the tubes are blocked.   Minilaparotomy. This is done right after giving birth. A small incision is made under the belly button and the tubes are exposed. The tubes can then be burned, tied and/or cut.   Tubal ligation can be done during a Cesarean section.   Castration is a surgical procedure that removes both ovaries.  Tubal sterilization should be discussed with your caregiver to answer any concerns you or your partner might have. This meeting will help to decide for sure if the operation is safe for you and which procedure is the best one for you. You can change your mind and cancel the surgery at any time. HOME CARE INSTRUCTIONS   Follow your caregivers instructions regarding diet, rest, work, social and sexual activities and follow up appointments.   Shoulder pain is common following a laparoscopy. The pain may be relieved by lying down flat.   Only take over-the-counter or prescription medicines for pain, discomfort or fever as directed by your caregiver.   You may use lozenges for throat discomfort.   Keep the incisions covered to prevent infection.  SEEK IMMEDIATE MEDICAL CARE IF:   You develop a temperature of 102 F (38.9 C), or as your caregiver suggests.   You become dizzy or faint.   You start to feel sick to your stomach (nausea) or throw up (vomit).   You develop abdominal pain not relieved with over-the-counter medications.   You have redness and puffiness (  swelling) of the cut (incision).   You see pus draining from the incision.   You miss a menstrual period.  Document Released: 06/24/2007 Document Revised: 12/25/2010 Document Reviewed: 06/24/2007 [facility] Patient Information 2012 [place], [place].  Preterm Labor Preterm labor is when labor starts at less than 37 weeks of pregnancy. The normal length of a pregnancy is 39 to 41  weeks. CAUSES Often, there is no identifiable underlying cause as to why a woman goes into preterm labor. However, one of the most common known causes of preterm labor is infection. Infections of the uterus, cervix, vagina, amniotic sac, bladder, kidney, or even the lungs (pneumonia) can cause labor to start. Other causes of preterm labor include:  Urogenital infections, such as yeast infections and bacterial vaginosis.   Uterine abnormalities (uterine shape, uterine septum, fibroids, bleeding from the placenta).   A cervix that has been operated on and opens prematurely.   Malformations in the baby.   Multiple gestations (twins, triplets, and so on).   Breakage of the amniotic sac.  Additional risk factors for preterm labor include:  Previous history of preterm labor.   Premature rupture of membranes (PROM).   A placenta that covers the opening of the cervix (placenta previa).   A placenta that separates from the uterus (placenta abruption).   A cervix that is too weak to hold the baby in the uterus (incompetence cervix).   Having too much fluid in the amniotic sac (polyhydramnios).   Taking illegal drugs or smoking while pregnant.   Not gaining enough weight while pregnant.   Women younger than 2 and older than 22 years old.   Low socioeconomic status.   African-American ethnicity.  SYMPTOMS Signs and symptoms of preterm labor include:  Menstrual-like cramps.   Contractions that are 30 to 70 seconds apart, become very regular, closer together, and are more intense and painful.   Contractions that start on the top of the uterus and spread down to the lower abdomen and back.   A sense of increased pelvic pressure or back pain.   A watery or bloody discharge that comes from the vagina.  DIAGNOSIS  A diagnosis can be confirmed by:  A vaginal exam.   An ultrasound of the cervix.   Sampling (swabbing) cervico-vaginal secretions. These samples can be tested for  the presence of fetal fibronectin. This is a protein found in cervical discharge which is associated with preterm labor.   Fetal monitoring.  TREATMENT  Depending on the length of the pregnancy and other circumstances, a caregiver may suggest bed rest. If necessary, there are medicines that can be given to stop contractions and to quicken fetal lung maturity. If labor happens before 34 weeks of pregnancy, a prolonged hospital stay may be recommended. Treatment depends on the condition of both the mother and baby. PREVENTION There are some things a mother can do to lower the risk of preterm labor in future pregnancies. A woman can:   Stop smoking.   Maintain healthy weight gain and avoid chemicals and drugs that are not necessary.   Be watchful for any type of infection.   Inform her caregiver if she has a known history of preterm labor.  Document Released: 03/28/2003 Document Revised: 12/25/2010 Document Reviewed: 05/02/2010 [facility] Patient Information 2012 [place], [place].

## 2011-07-13 NOTE — Progress Notes (Signed)
Pulse 90 Patient reports mild pelvic pressure and mucous discharge with foul odor that started this morning

## 2011-07-20 ENCOUNTER — Ambulatory Visit (INDEPENDENT_AMBULATORY_CARE_PROVIDER_SITE_OTHER): Payer: Medicaid Other

## 2011-07-20 VITALS — BP 110/70

## 2011-07-20 DIAGNOSIS — Z8751 Personal history of pre-term labor: Secondary | ICD-10-CM

## 2011-07-20 DIAGNOSIS — O09219 Supervision of pregnancy with history of pre-term labor, unspecified trimester: Secondary | ICD-10-CM

## 2011-07-27 ENCOUNTER — Ambulatory Visit (INDEPENDENT_AMBULATORY_CARE_PROVIDER_SITE_OTHER): Payer: Self-pay | Admitting: Obstetrics & Gynecology

## 2011-07-27 ENCOUNTER — Other Ambulatory Visit: Payer: Self-pay

## 2011-07-27 VITALS — BP 109/68 | Temp 97.4°F | Wt 137.0 lb

## 2011-07-27 DIAGNOSIS — N898 Other specified noninflammatory disorders of vagina: Secondary | ICD-10-CM

## 2011-07-27 DIAGNOSIS — O0990 Supervision of high risk pregnancy, unspecified, unspecified trimester: Secondary | ICD-10-CM

## 2011-07-27 DIAGNOSIS — O09219 Supervision of pregnancy with history of pre-term labor, unspecified trimester: Secondary | ICD-10-CM

## 2011-07-27 DIAGNOSIS — O9989 Other specified diseases and conditions complicating pregnancy, childbirth and the puerperium: Secondary | ICD-10-CM

## 2011-07-27 DIAGNOSIS — Z8751 Personal history of pre-term labor: Secondary | ICD-10-CM

## 2011-07-27 DIAGNOSIS — O26899 Other specified pregnancy related conditions, unspecified trimester: Secondary | ICD-10-CM

## 2011-07-27 LAB — POCT URINALYSIS DIP (DEVICE)
Glucose, UA: NEGATIVE mg/dL
Hgb urine dipstick: NEGATIVE
Ketones, ur: NEGATIVE mg/dL
Specific Gravity, Urine: 1.025 (ref 1.005–1.030)

## 2011-07-27 NOTE — Progress Notes (Signed)
Sterile speculum exam=neg ferning, pooling, nitrizine NST today for decreased fetal mvmt.

## 2011-07-27 NOTE — Progress Notes (Signed)
Patient states that she has had a decrease in fetal movement x 2 days Patient describes clear watery discharge x 1 week and occasional UCs.

## 2011-07-27 NOTE — Progress Notes (Signed)
Pt complaining of leakage of fluid.  Pooling negative, fern negative and nitrizine neg.  Wet prep and Gc/Chalm sent.  17-P today. Decreased fetal mvmt.  Will order NST (pt left before done; RN Nash Dimmer to call pt).

## 2011-07-28 ENCOUNTER — Other Ambulatory Visit: Payer: Self-pay | Admitting: Obstetrics & Gynecology

## 2011-07-28 ENCOUNTER — Ambulatory Visit (INDEPENDENT_AMBULATORY_CARE_PROVIDER_SITE_OTHER): Payer: Self-pay | Admitting: *Deleted

## 2011-07-28 VITALS — BP 107/71 | Wt 137.6 lb

## 2011-07-28 DIAGNOSIS — O368190 Decreased fetal movements, unspecified trimester, not applicable or unspecified: Secondary | ICD-10-CM

## 2011-07-28 LAB — GC/CHLAMYDIA PROBE AMP, GENITAL: GC Probe Amp, Genital: NEGATIVE

## 2011-07-28 LAB — WET PREP, GENITAL

## 2011-07-28 MED ORDER — FLUCONAZOLE 100 MG PO TABS: 150.0000 mg | ORAL_TABLET | Freq: Every day | ORAL | Status: AC

## 2011-07-28 NOTE — Progress Notes (Signed)
P = 102    Pt was not able to return yesterday for NST as scheduled. She continues to report decreased FM, however, baby does move.  Good FM present during NST, however pt was only aware of 5 FM's.  BID kick count instructions given and instructed to return to hospital for further decreased FM.  Pt left feeling reassured of fetal well-being.

## 2011-07-28 NOTE — Progress Notes (Signed)
Called pt and informed her of yeast infection and need to pick up Rx. Pt voiced understanding.

## 2011-07-29 NOTE — Progress Notes (Signed)
NST reviewed and reactive.  HARRAWAY-SMITH, Deneisha Dade

## 2011-08-03 ENCOUNTER — Ambulatory Visit (INDEPENDENT_AMBULATORY_CARE_PROVIDER_SITE_OTHER): Payer: Self-pay

## 2011-08-03 VITALS — BP 113/74 | HR 92 | Wt 138.6 lb

## 2011-08-03 DIAGNOSIS — O09219 Supervision of pregnancy with history of pre-term labor, unspecified trimester: Secondary | ICD-10-CM

## 2011-08-03 DIAGNOSIS — Z8751 Personal history of pre-term labor: Secondary | ICD-10-CM

## 2011-08-10 ENCOUNTER — Ambulatory Visit (INDEPENDENT_AMBULATORY_CARE_PROVIDER_SITE_OTHER): Payer: Self-pay | Admitting: Advanced Practice Midwife

## 2011-08-10 VITALS — BP 114/69 | Temp 97.8°F | Wt 140.9 lb

## 2011-08-10 DIAGNOSIS — Z8751 Personal history of pre-term labor: Secondary | ICD-10-CM

## 2011-08-10 DIAGNOSIS — O09219 Supervision of pregnancy with history of pre-term labor, unspecified trimester: Secondary | ICD-10-CM

## 2011-08-10 LAB — POCT URINALYSIS DIP (DEVICE)
Bilirubin Urine: NEGATIVE
Glucose, UA: NEGATIVE mg/dL
Hgb urine dipstick: NEGATIVE
Ketones, ur: NEGATIVE mg/dL
Specific Gravity, Urine: 1.015 (ref 1.005–1.030)

## 2011-08-10 NOTE — Progress Notes (Signed)
5-6 UCs/ hour despite increased fluids and rest. Cervix long and closed. 17-P given. Baby very active on exam.

## 2011-08-10 NOTE — Patient Instructions (Signed)
Pregnancy - Third Trimester The third trimester of pregnancy (the last 3 months) is a period of the most rapid growth for you and your baby. The baby approaches a length of 20 inches and a weight of 6 to 10 pounds. The baby is adding on fat and getting ready for life outside your body. While inside, babies have periods of sleeping and waking, suck their thumbs, and hiccups. You can often feel small contractions of the uterus. This is false labor. It is also called Braxton-Hicks contractions. This is like a practice for labor. The usual problems in this stage of pregnancy include more difficulty breathing, swelling of the hands and feet from water retention, and having to urinate more often because of the uterus and baby pressing on your bladder.  PRENATAL EXAMS  Blood work may continue to be done during prenatal exams. These tests are done to check on your health and the probable health of your baby. Blood work is used to follow your blood levels (hemoglobin). Anemia (low hemoglobin) is common during pregnancy. Iron and vitamins are given to help prevent this. You may also continue to be checked for diabetes. Some of the past blood tests may be done again.   The size of the uterus is measured during each visit. This makes sure your baby is growing properly according to your pregnancy dates.   Your blood pressure is checked every prenatal visit. This is to make sure you are not getting toxemia.   Your urine is checked every prenatal visit for infection, diabetes and protein.   Your weight is checked at each visit. This is done to make sure gains are happening at the suggested rate and that you and your baby are growing normally.   Sometimes, an ultrasound is performed to confirm the position and the proper growth and development of the baby. This is a test done that bounces harmless sound waves off the baby so your caregiver can more accurately determine due dates.   Discuss the type of pain  medication and anesthesia you will have during your labor and delivery.   Discuss the possibility and anesthesia if a Cesarean Section might be necessary.   Inform your caregiver if there is any mental or physical violence at home.  Sometimes, a specialized non-stress test, contraction stress test and biophysical profile are done to make sure the baby is not having a problem. Checking the amniotic fluid surrounding the baby is called an amniocentesis. The amniotic fluid is removed by sticking a needle into the belly (abdomen). This is sometimes done near the end of pregnancy if an early delivery is required. In this case, it is done to help make sure the baby's lungs are mature enough for the baby to live outside of the womb. If the lungs are not mature and it is unsafe to deliver the baby, an injection of cortisone medication is given to the mother 1 to 2 days before the delivery. This helps the baby's lungs mature and makes it safer to deliver the baby. CHANGES OCCURING IN THE THIRD TRIMESTER OF PREGNANCY Your body goes through many changes during pregnancy. They vary from person to person. Talk to your caregiver about changes you notice and are concerned about.  During the last trimester, you have probably had an increase in your appetite. It is normal to have cravings for certain foods. This varies from person to person and pregnancy to pregnancy.   You may begin to get stretch marks on your hips,   abdomen, and breasts. These are normal changes in the body during pregnancy. There are no exercises or medications to take which prevent this change.   Constipation may be treated with a stool softener or adding bulk to your diet. Drinking lots of fluids, fiber in vegetables, fruits, and whole grains are helpful.   Exercising is also helpful. If you have been very active up until your pregnancy, most of these activities can be continued during your pregnancy. If you have been less active, it is helpful  to start an exercise program such as walking. Consult your caregiver before starting exercise programs.   Avoid all smoking, alcohol, un-prescribed drugs, herbs and "street drugs" during your pregnancy. These chemicals affect the formation and growth of the baby. Avoid chemicals throughout the pregnancy to ensure the delivery of a healthy infant.   Backache, varicose veins and hemorrhoids may develop or get worse.   You will tire more easily in the third trimester, which is normal.   The baby's movements may be stronger and more often.   You may become short of breath easily.   Your belly button may stick out.   A yellow discharge may leak from your breasts called colostrum.   You may have a bloody mucus discharge. This usually occurs a few days to a week before labor begins.  HOME CARE INSTRUCTIONS   Keep your caregiver's appointments. Follow your caregiver's instructions regarding medication use, exercise, and diet.   During pregnancy, you are providing food for you and your baby. Continue to eat regular, well-balanced meals. Choose foods such as meat, fish, milk and other low fat dairy products, vegetables, fruits, and whole-grain breads and cereals. Your caregiver will tell you of the ideal weight gain.   A physical sexual relationship may be continued throughout pregnancy if there are no other problems such as early (premature) leaking of amniotic fluid from the membranes, vaginal bleeding, or belly (abdominal) pain.   Exercise regularly if there are no restrictions. Check with your caregiver if you are unsure of the safety of your exercises. Greater weight gain will occur in the last 2 trimesters of pregnancy. Exercising helps:   Control your weight.   Get you in shape for labor and delivery.   You lose weight after you deliver.   Rest a lot with legs elevated, or as needed for leg cramps or low back pain.   Wear a good support or jogging bra for breast tenderness during  pregnancy. This may help if worn during sleep. Pads or tissues may be used in the bra if you are leaking colostrum.   Do not use hot tubs, steam rooms, or saunas.   Wear your seat belt when driving. This protects you and your baby if you are in an accident.   Avoid raw meat, cat litter boxes and soil used by cats. These carry germs that can cause birth defects in the baby.   It is easier to loose urine during pregnancy. Tightening up and strengthening the pelvic muscles will help with this problem. You can practice stopping your urination while you are going to the bathroom. These are the same muscles you need to strengthen. It is also the muscles you would use if you were trying to stop from passing gas. You can practice tightening these muscles up 10 times a set and repeating this about 3 times per day. Once you know what muscles to tighten up, do not perform these exercises during urination. It is more likely   to cause an infection by backing up the urine.   Ask for help if you have financial, counseling or nutritional needs during pregnancy. Your caregiver will be able to offer counseling for these needs as well as refer you for other special needs.   Make a list of emergency phone numbers and have them available.   Plan on getting help from family or friends when you go home from the hospital.   Make a trial run to the hospital.   Take prenatal classes with the father to understand, practice and ask questions about the labor and delivery.   Prepare the baby's room/nursery.   Do not travel out of the city unless it is absolutely necessary and with the advice of your caregiver.   Wear only low or no heal shoes to have better balance and prevent falling.  MEDICATIONS AND DRUG USE IN PREGNANCY  Take prenatal vitamins as directed. The vitamin should contain 1 milligram of folic acid. Keep all vitamins out of reach of children. Only a couple vitamins or tablets containing iron may be fatal  to a baby or young child when ingested.   Avoid use of all medications, including herbs, over-the-counter medications, not prescribed or suggested by your caregiver. Only take over-the-counter or prescription medicines for pain, discomfort, or fever as directed by your caregiver. Do not use aspirin, ibuprofen (Motrin, Advil, Nuprin) or naproxen (Aleve) unless OK'd by your caregiver.   Let your caregiver also know about herbs you may be using.   Alcohol is related to a number of birth defects. This includes fetal alcohol syndrome. All alcohol, in any form, should be avoided completely. Smoking will cause low birth rate and premature babies.   Street/illegal drugs are very harmful to the baby. They are absolutely forbidden. A baby born to an addicted mother will be addicted at birth. The baby will go through the same withdrawal an adult does.  SEEK MEDICAL CARE IF: You have any concerns or worries during your pregnancy. It is better to call with your questions if you feel they cannot wait, rather than worry about them. DECISIONS ABOUT CIRCUMCISION You may or may not know the sex of your baby. If you know your baby is a boy, it may be time to think about circumcision. Circumcision is the removal of the foreskin of the penis. This is the skin that covers the sensitive end of the penis. There is no proven medical need for this. Often this decision is made on what is popular at the time or based upon religious beliefs and social issues. You can discuss these issues with your caregiver or pediatrician. SEEK IMMEDIATE MEDICAL CARE IF:   An unexplained oral temperature above 102 F (38.9 C) develops, or as your caregiver suggests.   You have leaking of fluid from the vagina (birth canal). If leaking membranes are suspected, take your temperature and tell your caregiver of this when you call.   There is vaginal spotting, bleeding or passing clots. Tell your caregiver of the amount and how many pads are  used.   You develop a bad smelling vaginal discharge with a change in the color from clear to white.   You develop vomiting that lasts more than 24 hours.   You develop chills or fever.   You develop shortness of breath.   You develop burning on urination.   You loose more than 2 pounds of weight or gain more than 2 pounds of weight or as suggested by your   caregiver.   You notice sudden swelling of your face, hands, and feet or legs.   You develop belly (abdominal) pain. Round ligament discomfort is a common non-cancerous (benign) cause of abdominal pain in pregnancy. Your caregiver still must evaluate you.   You develop a severe headache that does not go away.   You develop visual problems, blurred or double vision.   If you have not felt your baby move for more than 1 hour. If you think the baby is not moving as much as usual, eat something with sugar in it and lie down on your left side for an hour. The baby should move at least 4 to 5 times per hour. Call right away if your baby moves less than that.   You fall, are in a car accident or any kind of trauma.   There is mental or physical violence at home.  Document Released: 12/30/2000 Document Revised: 12/25/2010 Document Reviewed: 07/04/2008 [employer] Patient Information 2012 [employer]. 

## 2011-08-10 NOTE — Progress Notes (Signed)
Pulse: 91

## 2011-08-17 ENCOUNTER — Ambulatory Visit: Payer: Self-pay | Admitting: Medical

## 2011-08-17 ENCOUNTER — Ambulatory Visit (INDEPENDENT_AMBULATORY_CARE_PROVIDER_SITE_OTHER): Payer: Self-pay | Admitting: Obstetrics and Gynecology

## 2011-08-17 VITALS — BP 96/64 | HR 83 | Resp 12

## 2011-08-17 VITALS — BP 96/64 | Temp 98.1°F | Wt 141.0 lb

## 2011-08-17 DIAGNOSIS — Z8751 Personal history of pre-term labor: Secondary | ICD-10-CM

## 2011-08-17 DIAGNOSIS — O09219 Supervision of pregnancy with history of pre-term labor, unspecified trimester: Secondary | ICD-10-CM

## 2011-08-17 DIAGNOSIS — O0990 Supervision of high risk pregnancy, unspecified, unspecified trimester: Secondary | ICD-10-CM

## 2011-08-17 LAB — POCT URINALYSIS DIP (DEVICE)
Bilirubin Urine: NEGATIVE
Hgb urine dipstick: NEGATIVE
Ketones, ur: NEGATIVE mg/dL
Leukocytes, UA: NEGATIVE
Specific Gravity, Urine: 1.02 (ref 1.005–1.030)
pH: 7 (ref 5.0–8.0)

## 2011-08-17 NOTE — Progress Notes (Signed)
Patient complaining of lower back and lower abdominal cramping pain. Cx- closed/ long/ firm. Encouraged patient to stay well hydrated and wear a maternity support belt Continue weekly 17-P. FM/PTL precautions reviewed.

## 2011-08-17 NOTE — Progress Notes (Signed)
Pulse- 83  Pain-"severe lower back pain"  Vaginal discharge-clear

## 2011-08-25 ENCOUNTER — Ambulatory Visit (INDEPENDENT_AMBULATORY_CARE_PROVIDER_SITE_OTHER): Payer: Self-pay | Admitting: Medical

## 2011-08-25 VITALS — BP 104/56 | HR 96 | Resp 12

## 2011-08-25 DIAGNOSIS — O0990 Supervision of high risk pregnancy, unspecified, unspecified trimester: Secondary | ICD-10-CM

## 2011-08-25 DIAGNOSIS — Z8751 Personal history of pre-term labor: Secondary | ICD-10-CM

## 2011-08-25 DIAGNOSIS — O09219 Supervision of pregnancy with history of pre-term labor, unspecified trimester: Secondary | ICD-10-CM

## 2011-08-27 ENCOUNTER — Inpatient Hospital Stay (HOSPITAL_COMMUNITY)
Admission: AD | Admit: 2011-08-27 | Discharge: 2011-08-28 | Disposition: A | Discharge status: Home / Self Care | Payer: Medicaid Other | Source: Ambulatory Visit | Attending: Obstetrics & Gynecology | Admitting: Obstetrics & Gynecology

## 2011-08-27 ENCOUNTER — Encounter (HOSPITAL_COMMUNITY): Payer: Self-pay | Admitting: *Deleted

## 2011-08-27 DIAGNOSIS — O99891 Other specified diseases and conditions complicating pregnancy: Secondary | ICD-10-CM | POA: Insufficient documentation

## 2011-08-27 DIAGNOSIS — R109 Unspecified abdominal pain: Secondary | ICD-10-CM | POA: Insufficient documentation

## 2011-08-27 DIAGNOSIS — Z8751 Personal history of pre-term labor: Secondary | ICD-10-CM

## 2011-08-27 NOTE — MAU Note (Signed)
Michelle Macias felt a lot of pain and pressure like baby was pushing down.  Then stated to have cramping that goes to her back.  Then noticed that she has had a clear watery discharge that kept coming and has continued through today

## 2011-08-27 NOTE — Progress Notes (Signed)
  Chief Complaint:  Leaking fluid  Michelle Macias is  21 y.o. [phone] at [redacted]w[redacted]d presents complaining of leaking clear fluid over the last two days.  The quantity has been enough to require a pantyliner. She states she has some irregular abdominal cramping but no VB.  She reports decreased FM while at home but baby has been active while here on the monitor.  She has a h/o PPROM @32  weeks with a previous pregnancy.   Obstetrical/Gynecological History: Menstrual History: OB History    Grav Para Term Preterm Abortions TAB SAB Ect Mult Living   4 2 1 1 1  1   2       Patient's last menstrual period was 01/03/2011.     Past Medical History: Past Medical History  Diagnosis Date  . Preterm labor   . Anemia     Past Surgical History: Past Surgical History  Procedure Date  . Hernia repair     Inguinal 21 yo    Family History: Family History  Problem Relation Age of Onset  . Cancer Maternal Grandmother     breast cancer  . Diabetes Maternal Grandfather   . Other Neg Hx     Social History: History  Substance Use Topics  . Smoking status: Never Smoker   . Smokeless tobacco: Never Used  . Alcohol Use: No    Allergies: No Known Allergies  Meds:  Prescriptions prior to admission  Medication Sig Dispense Refill  . Prenatal Vit-Fe Fumarate-FA (PRENATAL MULTIVITAMIN) 60-1 MG tablet Take 1 tablet by mouth daily.       17-OHP weekly  Review of Systems - Please refer to the aforementioned patients' reports.     Physical Exam  Blood pressure 109/73, pulse 100, temperature 98 F (36.7 C), temperature source Oral, resp. rate 18, height 4\' 11"  (1.499 m), weight 65.409 kg (144 lb 3.2 oz), last menstrual period 01/03/2011. GENERAL: Well-developed, well-nourished female in no acute distress. . ABDOMEN: Soft, nontender, nondistended, gravid.  EXTREMITIES: Nontender, no edema, 2+ distal pulses. CERVICAL EXAM: Dilatation 0cm   Effacement 0%   Station high   Speculum exam: no  pooling FHT:  Baseline rate 130 bpm   Variability moderate  Accelerations present   Decelerations none Contractions:  irregular   Labs: Amnisure sent  Imaging Studies:  No results found.  Assessment: Oza Oberle is  21 y.o. [phone] at [redacted]w[redacted]d presents with LOF. H/o PPROM @ 32 wks R/o SROM  Plan: Observe, amnisure pending, continue fetal monitoring.  Amnisure negative. Pt without any further leaking. FHT reactive.  Discharge home, keep next prenatal appt., return for any further problems/concerns.  Lawernce Pitts 8/8/201310:38 PM   I have seen this patient and agree with the above student's note.  LEFTWICH-KIRBY, Heywood Tokunaga Certified Nurse-Midwife

## 2011-08-31 ENCOUNTER — Ambulatory Visit (INDEPENDENT_AMBULATORY_CARE_PROVIDER_SITE_OTHER): Payer: Medicaid Other | Admitting: Obstetrics & Gynecology

## 2011-08-31 VITALS — BP 106/71 | Temp 98.2°F | Wt 142.3 lb

## 2011-08-31 DIAGNOSIS — O0990 Supervision of high risk pregnancy, unspecified, unspecified trimester: Secondary | ICD-10-CM

## 2011-08-31 DIAGNOSIS — O09219 Supervision of pregnancy with history of pre-term labor, unspecified trimester: Secondary | ICD-10-CM

## 2011-08-31 DIAGNOSIS — Z8751 Personal history of pre-term labor: Secondary | ICD-10-CM

## 2011-08-31 LAB — POCT URINALYSIS DIP (DEVICE)
Bilirubin Urine: NEGATIVE
Hgb urine dipstick: NEGATIVE
Ketones, ur: NEGATIVE mg/dL
Specific Gravity, Urine: >=1.03 (ref 1.005–1.030)
pH: 6 (ref 5.0–8.0)

## 2011-08-31 NOTE — Progress Notes (Signed)
ROM and PTL ruled out in MAU on 08/27/11. Pt has RUQ soreness. Exam c/w MS pain. Benadryl at night for sleep would be ok.

## 2011-08-31 NOTE — Progress Notes (Signed)
P= 87 C/o pain in RUQ

## 2011-08-31 NOTE — Patient Instructions (Signed)

## 2011-09-07 ENCOUNTER — Encounter: Payer: Medicaid Other | Admitting: Family Medicine

## 2011-09-08 ENCOUNTER — Ambulatory Visit (INDEPENDENT_AMBULATORY_CARE_PROVIDER_SITE_OTHER): Payer: Medicaid Other | Admitting: Medical

## 2011-09-08 VITALS — BP 103/73 | HR 88 | Resp 12

## 2011-09-08 DIAGNOSIS — O0990 Supervision of high risk pregnancy, unspecified, unspecified trimester: Secondary | ICD-10-CM

## 2011-09-08 DIAGNOSIS — O09219 Supervision of pregnancy with history of pre-term labor, unspecified trimester: Secondary | ICD-10-CM

## 2011-09-08 DIAGNOSIS — Z8751 Personal history of pre-term labor: Secondary | ICD-10-CM

## 2011-09-14 ENCOUNTER — Ambulatory Visit (INDEPENDENT_AMBULATORY_CARE_PROVIDER_SITE_OTHER): Payer: Medicaid Other | Admitting: Physician Assistant

## 2011-09-14 VITALS — BP 108/70 | Temp 97.1°F | Wt 144.8 lb

## 2011-09-14 DIAGNOSIS — Z8751 Personal history of pre-term labor: Secondary | ICD-10-CM

## 2011-09-14 DIAGNOSIS — O0990 Supervision of high risk pregnancy, unspecified, unspecified trimester: Secondary | ICD-10-CM

## 2011-09-14 LAB — POCT URINALYSIS DIP (DEVICE)
Ketones, ur: NEGATIVE mg/dL
Protein, ur: NEGATIVE mg/dL
Specific Gravity, Urine: 1.02 (ref 1.005–1.030)

## 2011-09-14 NOTE — Progress Notes (Signed)
No complaints. Last 17-p injection today. GBS/GC/Chl today. Labor precautions [place] for presentation

## 2011-09-14 NOTE — Addendum Note (Signed)
Addended by: Franchot Mimes on: 09/14/2011 11:01 AM   Modules accepted: Orders

## 2011-09-14 NOTE — Patient Instructions (Signed)

## 2011-09-14 NOTE — Progress Notes (Signed)
P = 98 

## 2011-09-14 NOTE — Progress Notes (Signed)
Informal US for presentation - vertex.  

## 2011-09-15 LAB — GC/CHLAMYDIA PROBE AMP, GENITAL: Chlamydia, DNA Probe: NEGATIVE

## 2011-09-18 ENCOUNTER — Encounter: Payer: Self-pay | Admitting: Family

## 2011-09-28 ENCOUNTER — Ambulatory Visit (INDEPENDENT_AMBULATORY_CARE_PROVIDER_SITE_OTHER): Payer: Medicaid Other | Admitting: Family Medicine

## 2011-09-28 VITALS — BP 116/78 | Temp 97.7°F | Wt 147.0 lb

## 2011-09-28 DIAGNOSIS — O0993 Supervision of high risk pregnancy, unspecified, third trimester: Secondary | ICD-10-CM

## 2011-09-28 DIAGNOSIS — O09219 Supervision of pregnancy with history of pre-term labor, unspecified trimester: Secondary | ICD-10-CM

## 2011-09-28 LAB — POCT URINALYSIS DIP (DEVICE)
Bilirubin Urine: NEGATIVE
Glucose, UA: NEGATIVE mg/dL
Hgb urine dipstick: NEGATIVE
Leukocytes, UA: NEGATIVE
Nitrite: NEGATIVE

## 2011-09-28 NOTE — Progress Notes (Signed)
Contractions q 7-8 minutes since last night. No LOF or VB, mucous discharge. Baby moving well. Cervix unchanged since previous visit. Labor precautions discussed.

## 2011-09-28 NOTE — Progress Notes (Signed)
Pulse 99.  C/o of pain and pressure in pelvic. Vaginal d/c as clear mucous.

## 2011-09-28 NOTE — Patient Instructions (Signed)
Pregnancy - Third Trimester The third trimester of pregnancy (the last 3 months) is a period of the most rapid growth for you and your baby. The baby approaches a length of 20 inches and a weight of 6 to 10 pounds. The baby is adding on fat and getting ready for life outside your body. While inside, babies have periods of sleeping and waking, suck their thumbs, and hiccups. You can often feel small contractions of the uterus. This is false labor. It is also called Braxton-Hicks contractions. This is like a practice for labor. The usual problems in this stage of pregnancy include more difficulty breathing, swelling of the hands and feet from water retention, and having to urinate more often because of the uterus and baby pressing on your bladder.  PRENATAL EXAMS  Blood work may continue to be done during prenatal exams. These tests are done to check on your health and the probable health of your baby. Blood work is used to follow your blood levels (hemoglobin). Anemia (low hemoglobin) is common during pregnancy. Iron and vitamins are given to help prevent this. You may also continue to be checked for diabetes. Some of the past blood tests may be done again.   The size of the uterus is measured during each visit. This makes sure your baby is growing properly according to your pregnancy dates.   Your blood pressure is checked every prenatal visit. This is to make sure you are not getting toxemia.   Your urine is checked every prenatal visit for infection, diabetes and protein.   Your weight is checked at each visit. This is done to make sure gains are happening at the suggested rate and that you and your baby are growing normally.   Sometimes, an ultrasound is performed to confirm the position and the proper growth and development of the baby. This is a test done that bounces harmless sound waves off the baby so your caregiver can more accurately determine due dates.   Discuss the type of pain  medication and anesthesia you will have during your labor and delivery.   Discuss the possibility and anesthesia if a Cesarean Section might be necessary.   Inform your caregiver if there is any mental or physical violence at home.  Sometimes, a specialized non-stress test, contraction stress test and biophysical profile are done to make sure the baby is not having a problem. Checking the amniotic fluid surrounding the baby is called an amniocentesis. The amniotic fluid is removed by sticking a needle into the belly (abdomen). This is sometimes done near the end of pregnancy if an early delivery is required. In this case, it is done to help make sure the baby's lungs are mature enough for the baby to live outside of the womb. If the lungs are not mature and it is unsafe to deliver the baby, an injection of cortisone medication is given to the mother 1 to 2 days before the delivery. This helps the baby's lungs mature and makes it safer to deliver the baby. CHANGES OCCURING IN THE THIRD TRIMESTER OF PREGNANCY Your body goes through many changes during pregnancy. They vary from person to person. Talk to your caregiver about changes you notice and are concerned about.  During the last trimester, you have probably had an increase in your appetite. It is normal to have cravings for certain foods. This varies from person to person and pregnancy to pregnancy.   You may begin to get stretch marks on your hips,   abdomen, and breasts. These are normal changes in the body during pregnancy. There are no exercises or medications to take which prevent this change.   Constipation may be treated with a stool softener or adding bulk to your diet. Drinking lots of fluids, fiber in vegetables, fruits, and whole grains are helpful.   Exercising is also helpful. If you have been very active up until your pregnancy, most of these activities can be continued during your pregnancy. If you have been less active, it is helpful  to start an exercise program such as walking. Consult your caregiver before starting exercise programs.   Avoid all smoking, alcohol, un-prescribed drugs, herbs and "street drugs" during your pregnancy. These chemicals affect the formation and growth of the baby. Avoid chemicals throughout the pregnancy to ensure the delivery of a healthy infant.   Backache, varicose veins and hemorrhoids may develop or get worse.   You will tire more easily in the third trimester, which is normal.   The baby's movements may be stronger and more often.   You may become short of breath easily.   Your belly button may stick out.   A yellow discharge may leak from your breasts called colostrum.   You may have a bloody mucus discharge. This usually occurs a few days to a week before labor begins.  HOME CARE INSTRUCTIONS   Keep your caregiver's appointments. Follow your caregiver's instructions regarding medication use, exercise, and diet.   During pregnancy, you are providing food for you and your baby. Continue to eat regular, well-balanced meals. Choose foods such as meat, fish, milk and other low fat dairy products, vegetables, fruits, and whole-grain breads and cereals. Your caregiver will tell you of the ideal weight gain.   A physical sexual relationship may be continued throughout pregnancy if there are no other problems such as early (premature) leaking of amniotic fluid from the membranes, vaginal bleeding, or belly (abdominal) pain.   Exercise regularly if there are no restrictions. Check with your caregiver if you are unsure of the safety of your exercises. Greater weight gain will occur in the last 2 trimesters of pregnancy. Exercising helps:   Control your weight.   Get you in shape for labor and delivery.   You lose weight after you deliver.   Rest a lot with legs elevated, or as needed for leg cramps or low back pain.   Wear a good support or jogging bra for breast tenderness during  pregnancy. This may help if worn during sleep. Pads or tissues may be used in the bra if you are leaking colostrum.   Do not use hot tubs, steam rooms, or saunas.   Wear your seat belt when driving. This protects you and your baby if you are in an accident.   Avoid raw meat, cat litter boxes and soil used by cats. These carry germs that can cause birth defects in the baby.   It is easier to loose urine during pregnancy. Tightening up and strengthening the pelvic muscles will help with this problem. You can practice stopping your urination while you are going to the bathroom. These are the same muscles you need to strengthen. It is also the muscles you would use if you were trying to stop from passing gas. You can practice tightening these muscles up 10 times a set and repeating this about 3 times per day. Once you know what muscles to tighten up, do not perform these exercises during urination. It is more likely   to cause an infection by backing up the urine.   Ask for help if you have financial, counseling or nutritional needs during pregnancy. Your caregiver will be able to offer counseling for these needs as well as refer you for other special needs.   Make a list of emergency phone numbers and have them available.   Plan on getting help from family or friends when you go home from the hospital.   Make a trial run to the hospital.   Take prenatal classes with the father to understand, practice and ask questions about the labor and delivery.   Prepare the baby's room/nursery.   Do not travel out of the city unless it is absolutely necessary and with the advice of your caregiver.   Wear only low or no heal shoes to have better balance and prevent falling.  MEDICATIONS AND DRUG USE IN PREGNANCY  Take prenatal vitamins as directed. The vitamin should contain 1 milligram of folic acid. Keep all vitamins out of reach of children. Only a couple vitamins or tablets containing iron may be fatal  to a baby or young child when ingested.   Avoid use of all medications, including herbs, over-the-counter medications, not prescribed or suggested by your caregiver. Only take over-the-counter or prescription medicines for pain, discomfort, or fever as directed by your caregiver. Do not use aspirin, ibuprofen (Motrin, Advil, Nuprin) or naproxen (Aleve) unless OK'd by your caregiver.   Let your caregiver also know about herbs you may be using.   Alcohol is related to a number of birth defects. This includes fetal alcohol syndrome. All alcohol, in any form, should be avoided completely. Smoking will cause low birth rate and premature babies.   Street/illegal drugs are very harmful to the baby. They are absolutely forbidden. A baby born to an addicted mother will be addicted at birth. The baby will go through the same withdrawal an adult does.  SEEK MEDICAL CARE IF: You have any concerns or worries during your pregnancy. It is better to call with your questions if you feel they cannot wait, rather than worry about them. DECISIONS ABOUT CIRCUMCISION You may or may not know the sex of your baby. If you know your baby is a boy, it may be time to think about circumcision. Circumcision is the removal of the foreskin of the penis. This is the skin that covers the sensitive end of the penis. There is no proven medical need for this. Often this decision is made on what is popular at the time or based upon religious beliefs and social issues. You can discuss these issues with your caregiver or pediatrician. SEEK IMMEDIATE MEDICAL CARE IF:   An unexplained oral temperature above 102 F (38.9 C) develops, or as your caregiver suggests.   You have leaking of fluid from the vagina (birth canal). If leaking membranes are suspected, take your temperature and tell your caregiver of this when you call.   There is vaginal spotting, bleeding or passing clots. Tell your caregiver of the amount and how many pads are  used.   You develop a bad smelling vaginal discharge with a change in the color from clear to white.   You develop vomiting that lasts more than 24 hours.   You develop chills or fever.   You develop shortness of breath.   You develop burning on urination.   You loose more than 2 pounds of weight or gain more than 2 pounds of weight or as suggested by your   caregiver.   You notice sudden swelling of your face, hands, and feet or legs.   You develop belly (abdominal) pain. Round ligament discomfort is a common non-cancerous (benign) cause of abdominal pain in pregnancy. Your caregiver still must evaluate you.   You develop a severe headache that does not go away.   You develop visual problems, blurred or double vision.   If you have not felt your baby move for more than 1 hour. If you think the baby is not moving as much as usual, eat something with sugar in it and lie down on your left side for an hour. The baby should move at least 4 to 5 times per hour. Call right away if your baby moves less than that.   You fall, are in a car accident or any kind of trauma.   There is mental or physical violence at home.  Document Released: 12/30/2000 Document Revised: 12/25/2010 Document Reviewed: 07/04/2008 [employer] Patient Information 2012 [employer]. 

## 2011-10-01 NOTE — MAU Note (Signed)
[facility] sent release of info for prenatal records. Information faxed as requested

## 2011-10-05 ENCOUNTER — Ambulatory Visit (INDEPENDENT_AMBULATORY_CARE_PROVIDER_SITE_OTHER): Payer: Medicaid Other | Admitting: Advanced Practice Midwife

## 2011-10-05 VITALS — BP 104/76 | Temp 97.0°F | Wt 147.0 lb

## 2011-10-05 DIAGNOSIS — O09219 Supervision of pregnancy with history of pre-term labor, unspecified trimester: Secondary | ICD-10-CM

## 2011-10-05 DIAGNOSIS — Z8751 Personal history of pre-term labor: Secondary | ICD-10-CM

## 2011-10-05 LAB — POCT URINALYSIS DIP (DEVICE)
Bilirubin Urine: NEGATIVE
Ketones, ur: NEGATIVE mg/dL
pH: 6.5 (ref 5.0–8.0)

## 2011-10-05 NOTE — Progress Notes (Signed)
P-83 
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
## 2011-10-05 NOTE — Patient Instructions (Signed)
Fetal Movement Counts Patient Name: __________________________________________________ Patient Due Date: ____________________ Kick counts is highly recommended in high risk pregnancies, but it is a good idea for every pregnant woman to do. Start counting fetal movements at 28 weeks of the pregnancy. Fetal movements increase after eating a full meal or eating or drinking something sweet (the blood sugar is higher). It is also important to drink plenty of fluids (well hydrated) before doing the count. Lie on your left side because it helps with the circulation or you can sit in a comfortable chair with your arms over your belly (abdomen) with no distractions around you. DOING THE COUNT  Try to do the count the same time of day each time you do it.   Mark the day and time, then see how long it takes for you to feel 10 movements (kicks, flutters, swishes, rolls). You should have at least 10 movements within 2 hours. You will most likely feel 10 movements in much less than 2 hours. If you do not, wait an hour and count again. After a couple of days you will see a pattern.   What you are looking for is a change in the pattern or not enough counts in 2 hours. Is it taking longer in time to reach 10 movements?  SEEK MEDICAL CARE IF:  You feel less than 10 counts in 2 hours. Tried twice.   No movement in one hour.   The pattern is changing or taking longer each day to reach 10 counts in 2 hours.   You feel the baby is not moving as it usually does.  Date: ____________ Movements: ____________ Start time: ____________ Finish time: ____________  Date: ____________ Movements: ____________ Start time: ____________ Finish time: ____________ Date: ____________ Movements: ____________ Start time: ____________ Finish time: ____________ Date: ____________ Movements: ____________ Start time: ____________ Finish time: ____________ Date: ____________ Movements: ____________ Start time: ____________ Finish time:  ____________ Date: ____________ Movements: ____________ Start time: ____________ Finish time: ____________ Date: ____________ Movements: ____________ Start time: ____________ Finish time: ____________ Date: ____________ Movements: ____________ Start time: ____________ Finish time: ____________  Date: ____________ Movements: ____________ Start time: ____________ Finish time: ____________ Date: ____________ Movements: ____________ Start time: ____________ Finish time: ____________ Date: ____________ Movements: ____________ Start time: ____________ Finish time: ____________ Date: ____________ Movements: ____________ Start time: ____________ Finish time: ____________ Date: ____________ Movements: ____________ Start time: ____________ Finish time: ____________ Date: ____________ Movements: ____________ Start time: ____________ Finish time: ____________ Date: ____________ Movements: ____________ Start time: ____________ Finish time: ____________  Date: ____________ Movements: ____________ Start time: ____________ Finish time: ____________ Date: ____________ Movements: ____________ Start time: ____________ Finish time: ____________ Date: ____________ Movements: ____________ Start time: ____________ Finish time: ____________ Date: ____________ Movements: ____________ Start time: ____________ Finish time: ____________ Date: ____________ Movements: ____________ Start time: ____________ Finish time: ____________ Date: ____________ Movements: ____________ Start time: ____________ Finish time: ____________ Date: ____________ Movements: ____________ Start time: ____________ Finish time: ____________  Date: ____________ Movements: ____________ Start time: ____________ Finish time: ____________ Date: ____________ Movements: ____________ Start time: ____________ Finish time: ____________ Date: ____________ Movements: ____________ Start time: ____________ Finish time: ____________ Date: ____________ Movements:  ____________ Start time: ____________ Finish time: ____________ Date: ____________ Movements: ____________ Start time: ____________ Finish time: ____________ Date: ____________ Movements: ____________ Start time: ____________ Finish time: ____________ Date: ____________ Movements: ____________ Start time: ____________ Finish time: ____________  Date: ____________ Movements: ____________ Start time: ____________ Finish time: ____________ Date: ____________ Movements: ____________ Start time: ____________ Finish time: ____________ Date: ____________ Movements: ____________ Start time:   ____________ Finish time: ____________ Date: ____________ Movements: ____________ Start time: ____________ Finish time: ____________ Date: ____________ Movements: ____________ Start time: ____________ Finish time: ____________ Date: ____________ Movements: ____________ Start time: ____________ Finish time: ____________ Date: ____________ Movements: ____________ Start time: ____________ Finish time: ____________  Date: ____________ Movements: ____________ Start time: ____________ Finish time: ____________ Date: ____________ Movements: ____________ Start time: ____________ Finish time: ____________ Date: ____________ Movements: ____________ Start time: ____________ Finish time: ____________ Date: ____________ Movements: ____________ Start time: ____________ Finish time: ____________ Date: ____________ Movements: ____________ Start time: ____________ Finish time: ____________ Date: ____________ Movements: ____________ Start time: ____________ Finish time: ____________ Date: ____________ Movements: ____________ Start time: ____________ Finish time: ____________  Date: ____________ Movements: ____________ Start time: ____________ Finish time: ____________ Date: ____________ Movements: ____________ Start time: ____________ Finish time: ____________ Date: ____________ Movements: ____________ Start time: ____________ Finish  time: ____________ Date: ____________ Movements: ____________ Start time: ____________ Finish time: ____________ Date: ____________ Movements: ____________ Start time: ____________ Finish time: ____________ Date: ____________ Movements: ____________ Start time: ____________ Finish time: ____________ Date: ____________ Movements: ____________ Start time: ____________ Finish time: ____________  Date: ____________ Movements: ____________ Start time: ____________ Finish time: ____________ Date: ____________ Movements: ____________ Start time: ____________ Finish time: ____________ Date: ____________ Movements: ____________ Start time: ____________ Finish time: ____________ Date: ____________ Movements: ____________ Start time: ____________ Finish time: ____________ Date: ____________ Movements: ____________ Start time: ____________ Finish time: ____________ Date: ____________ Movements: ____________ Start time: ____________ Finish time: ____________ Document Released: 02/04/2006 Document Revised: 12/25/2010 Document Reviewed: 08/07/2008 [employer] Patient Information 2012 [employer]. 

## 2011-10-08 ENCOUNTER — Encounter: Payer: Self-pay | Admitting: *Deleted

## 2011-10-12 ENCOUNTER — Telehealth (HOSPITAL_COMMUNITY): Payer: Self-pay | Admitting: *Deleted

## 2011-10-12 ENCOUNTER — Ambulatory Visit (INDEPENDENT_AMBULATORY_CARE_PROVIDER_SITE_OTHER): Payer: Medicaid Other | Admitting: Obstetrics & Gynecology

## 2011-10-12 ENCOUNTER — Encounter (HOSPITAL_COMMUNITY): Payer: Self-pay | Admitting: *Deleted

## 2011-10-12 VITALS — BP 107/76 | Temp 98.5°F | Wt 146.0 lb

## 2011-10-12 DIAGNOSIS — O480 Post-term pregnancy: Secondary | ICD-10-CM

## 2011-10-12 DIAGNOSIS — O09219 Supervision of pregnancy with history of pre-term labor, unspecified trimester: Secondary | ICD-10-CM

## 2011-10-12 LAB — POCT URINALYSIS DIP (DEVICE)
Glucose, UA: NEGATIVE mg/dL
Ketones, ur: NEGATIVE mg/dL
Specific Gravity, Urine: 1.02 (ref 1.005–1.030)

## 2011-10-12 NOTE — Progress Notes (Signed)
Some back pain, possible contractions, no ROM. NST today reactive. Labor precautions given, RTC for NST

## 2011-10-12 NOTE — Progress Notes (Signed)
Pulse: 73

## 2011-10-12 NOTE — Patient Instructions (Signed)
Normal Labor and Delivery Your caregiver must first be sure you are in labor. Signs of labor include:  You may pass what is called "the mucus plug" before labor begins. This is a small amount of blood stained mucus.   Regular uterine contractions.   The time between contractions get closer together.   The discomfort and pain gradually gets more intense.   Pains are mostly located in the back.   Pains get worse when walking.   The cervix (the opening of the uterus becomes thinner (begins to efface) and opens up (dilates).  Once you are in labor and admitted into the hospital or care center, your caregiver will do the following:  A complete physical examination.   Check your vital signs (blood pressure, pulse, temperature and the fetal heart rate).   Do a vaginal examination (using a sterile glove and lubricant) to determine:   The position (presentation) of the baby (head [vertex] or buttock first).   The level (station) of the baby's head in the birth canal.   The effacement and dilatation of the cervix.   You may have your pubic hair shaved and be given an enema depending on your caregiver and the circumstance.   An electronic monitor is usually placed on your abdomen. The monitor follows the length and intensity of the contractions, as well as the baby's heart rate.   Usually, your caregiver will insert an IV in your arm with a bottle of sugar water. This is done as a precaution so that medications can be given to you quickly during labor or delivery.  NORMAL LABOR AND DELIVERY IS DIVIDED UP INTO 3 STAGES: First Stage This is when regular contractions begin and the cervix begins to efface and dilate. This stage can last from 3 to 15 hours. The end of the first stage is when the cervix is 100% effaced and 10 centimeters dilated. Pain medications may be given by   Injection (morphine, demerol, etc.)   Regional anesthesia (spinal, caudal or epidural, anesthetics given in  different locations of the spine). Paracervical pain medication may be given, which is an injection of and anesthetic on each side of the cervix.  A pregnant woman may request to have "Natural Childbirth" which is not to have any medications or anesthesia during her labor and delivery. Second Stage This is when the baby comes down through the birth canal (vagina) and is born. This can take 1 to 4 hours. As the baby's head comes down through the birth canal, you may feel like you are going to have a bowel movement. You will get the urge to bear down and push until the baby is delivered. As the baby's head is being delivered, the caregiver will decide if an episiotomy (a cut in the perineum and vagina area) is needed to prevent tearing of the tissue in this area. The episiotomy is sewn up after the delivery of the baby and placenta. Sometimes a mask with nitrous oxide is given for the mother to breath during the delivery of the baby to help if there is too much pain. The end of Stage 2 is when the baby is fully delivered. Then when the umbilical cord stops pulsating it is clamped and cut. Third Stage The third stage begins after the baby is completely delivered and ends after the placenta (afterbirth) is delivered. This usually takes 5 to 30 minutes. After the placenta is delivered, a medication is given either by intravenous or injection to help contract   the uterus and prevent bleeding. The third stage is not painful and pain medication is usually not necessary. If an episiotomy was done, it is repaired at this time. After the delivery, the mother is watched and monitored closely for 1 to 2 hours to make sure there is no postpartum bleeding (hemorrhage). If there is a lot of bleeding, medication is given to contract the uterus and stop the bleeding. Document Released: 10/15/2007 Document Revised: 12/25/2010 Document Reviewed: 10/15/2007 [employer] Patient Information 2012 [employer]. 

## 2011-10-12 NOTE — Progress Notes (Signed)
IOL scheduled 9/28 @ 0700.

## 2011-10-12 NOTE — Telephone Encounter (Signed)
Preadmission screen  

## 2011-10-15 ENCOUNTER — Ambulatory Visit (INDEPENDENT_AMBULATORY_CARE_PROVIDER_SITE_OTHER): Payer: Medicaid Other | Admitting: *Deleted

## 2011-10-15 VITALS — BP 121/80

## 2011-10-15 DIAGNOSIS — O480 Post-term pregnancy: Secondary | ICD-10-CM

## 2011-10-15 NOTE — Progress Notes (Signed)
P = 72   Testing results and fetal position reported to Dr. Penne Lash.  Pt has IOL scheduled on 10/17/11 @ 0700.  Dr. Debroah Loop is covering that Michelle Macias and will be notified of current fetal position later today.

## 2011-10-17 ENCOUNTER — Inpatient Hospital Stay (HOSPITAL_COMMUNITY)
Admission: RE | Admit: 2011-10-17 | Discharge: 2011-10-19 | DRG: 775 | Disposition: A | Discharge status: Home / Self Care | Payer: Medicaid Other | Source: Ambulatory Visit | Attending: Obstetrics & Gynecology | Admitting: Obstetrics & Gynecology

## 2011-10-17 ENCOUNTER — Inpatient Hospital Stay (HOSPITAL_COMMUNITY): Payer: Medicaid Other

## 2011-10-17 ENCOUNTER — Encounter (HOSPITAL_COMMUNITY): Payer: Self-pay

## 2011-10-17 VITALS — BP 119/76 | HR 68 | Temp 99.3°F | Resp 18 | Ht 59.0 in | Wt 146.0 lb

## 2011-10-17 DIAGNOSIS — D689 Coagulation defect, unspecified: Secondary | ICD-10-CM | POA: Diagnosis present

## 2011-10-17 DIAGNOSIS — O0990 Supervision of high risk pregnancy, unspecified, unspecified trimester: Secondary | ICD-10-CM

## 2011-10-17 DIAGNOSIS — O09219 Supervision of pregnancy with history of pre-term labor, unspecified trimester: Secondary | ICD-10-CM

## 2011-10-17 DIAGNOSIS — D696 Thrombocytopenia, unspecified: Secondary | ICD-10-CM | POA: Diagnosis present

## 2011-10-17 DIAGNOSIS — O480 Post-term pregnancy: Principal | ICD-10-CM | POA: Diagnosis present

## 2011-10-17 LAB — CBC
HCT: 32.8 % — ABNORMAL LOW (ref 36.0–46.0)
RDW: 15.1 % (ref 11.5–15.5)
WBC: 7.8 10*3/uL (ref 4.0–10.5)

## 2011-10-17 LAB — COMPREHENSIVE METABOLIC PANEL
ALT: 5 U/L (ref 0–35)
Albumin: 2.9 g/dL — ABNORMAL LOW (ref 3.5–5.2)
Alkaline Phosphatase: 185 U/L — ABNORMAL HIGH (ref 39–117)
BUN: 6 mg/dL (ref 6–23)
Chloride: 101 mEq/L (ref 96–112)
Glucose, Bld: 69 mg/dL — ABNORMAL LOW (ref 70–99)
Potassium: 3.7 mEq/L (ref 3.5–5.1)
Total Bilirubin: 0.3 mg/dL (ref 0.3–1.2)

## 2011-10-17 LAB — PREPARE RBC (CROSSMATCH)

## 2011-10-17 LAB — RPR: RPR Ser Ql: NONREACTIVE

## 2011-10-17 LAB — ABO/RH: ABO/RH(D): O POS

## 2011-10-17 MED ORDER — TERBUTALINE SULFATE 1 MG/ML IJ SOLN: 0.2500 mg | Freq: Once | INTRAMUSCULAR | Status: AC | PRN

## 2011-10-17 MED ORDER — FLEET ENEMA 7-19 GM/118ML RE ENEM: 1.0000 | ENEMA | RECTAL | Status: DC | PRN

## 2011-10-17 MED ORDER — NALBUPHINE SYRINGE 5 MG/0.5 ML
10.0000 mg | INJECTION | Freq: Once | INTRAMUSCULAR | Status: AC
  Administered 2011-10-17: 10 mg via INTRAVENOUS
  Filled 2011-10-17: qty 1

## 2011-10-17 MED ORDER — LACTATED RINGERS IV SOLN
INTRAVENOUS | Status: DC
  Administered 2011-10-17 – 2011-10-18 (×3): via INTRAVENOUS

## 2011-10-17 MED ORDER — ACETAMINOPHEN 325 MG PO TABS: 650.0000 mg | ORAL_TABLET | ORAL | Status: DC | PRN

## 2011-10-17 MED ORDER — BUTORPHANOL TARTRATE 1 MG/ML IJ SOLN
1.0000 mg | INTRAMUSCULAR | Status: DC | PRN
  Administered 2011-10-17 (×2): 1 mg via INTRAVENOUS
  Filled 2011-10-17 (×2): qty 1

## 2011-10-17 MED ORDER — IBUPROFEN 600 MG PO TABS: 600.0000 mg | ORAL_TABLET | Freq: Four times a day (QID) | ORAL | Status: DC | PRN

## 2011-10-17 MED ORDER — LIDOCAINE HCL (PF) 1 % IJ SOLN
30.0000 mL | INTRAMUSCULAR | Status: DC | PRN
  Filled 2011-10-17: qty 30

## 2011-10-17 MED ORDER — OXYCODONE-ACETAMINOPHEN 5-325 MG PO TABS
1.0000 | ORAL_TABLET | ORAL | Status: DC | PRN
  Administered 2011-10-18: 1 via ORAL
  Filled 2011-10-17: qty 1

## 2011-10-17 MED ORDER — ONDANSETRON HCL 4 MG/2ML IJ SOLN: 4.0000 mg | Freq: Four times a day (QID) | INTRAMUSCULAR | Status: DC | PRN

## 2011-10-17 MED ORDER — NALBUPHINE HCL 10 MG/ML IJ SOLN
10.0000 mg | INTRAMUSCULAR | Status: DC | PRN
  Administered 2011-10-17: 10 mg via INTRAVENOUS
  Filled 2011-10-17 (×2): qty 1

## 2011-10-17 MED ORDER — LACTATED RINGERS IV SOLN: 500.0000 mL | INTRAVENOUS | Status: DC | PRN

## 2011-10-17 MED ORDER — OXYTOCIN BOLUS FROM INFUSION
500.0000 mL | Freq: Once | INTRAVENOUS | Status: AC
  Administered 2011-10-18: 500 mL via INTRAVENOUS
  Filled 2011-10-17: qty 500

## 2011-10-17 MED ORDER — CITRIC ACID-SODIUM CITRATE 334-500 MG/5ML PO SOLN: 30.0000 mL | ORAL | Status: DC | PRN

## 2011-10-17 MED ORDER — MISOPROSTOL 25 MCG QUARTER TABLET
25.0000 ug | ORAL_TABLET | ORAL | Status: DC | PRN
  Administered 2011-10-17: 25 ug via VAGINAL
  Filled 2011-10-17: qty 0.25

## 2011-10-17 MED ORDER — OXYTOCIN 40 UNITS IN LACTATED RINGERS INFUSION - SIMPLE MED: 62.5000 mL/h | Freq: Once | INTRAVENOUS | Status: DC

## 2011-10-17 NOTE — Progress Notes (Signed)
Michelle Macias is a 21 y.o. [id number] at [redacted]w[redacted]d admitted for induction of labor due to Post dates.  Subjective: Feeling her ctx, esp in her back; feeling more pressure  Objective: BP 113/78  Pulse 75  Temp 98.1 F (36.7 C) (Oral)  Resp 18  Ht 4\' 11"  (1.499 m)  Wt 66.225 kg (146 lb)  BMI 29.49 kg/m2  LMP 01/03/2011      FHT:  FHR: 140 bpm, variability: moderate,  accelerations:  Present,  decelerations:  Absent UC:   regular, every 1-2 minutes SVE:   Dilation: 2.5 Effacement (%): 60 Station: -2 Exam by:: Michelle Macias  Labs: Lab Results  Component Value Date   WBC 7.8 10/17/2011   HGB 10.8* 10/17/2011   HCT 32.8* 10/17/2011   MCV 85.2 10/17/2011   PLT 66* 10/17/2011    Assessment / Plan: IOL for postdates, s/p cytotec x1  Labor: s/p cytotec x1 with some cervical change and increase in ctx frequency and intensity  Preeclampsia:  n/a Fetal Wellbeing:  Category I Pain Control:  Labor support without medications and cannot get epidural 2/2 isolated thrombocytopenia I/D:  n/a Anticipated MOD:  NSVD  Michelle Macias 10/17/2011, 1:30 PM

## 2011-10-17 NOTE — Progress Notes (Signed)
Michelle Macias is a 21 y.o. [id number] at [redacted]w[redacted]d admitted for induction of labor due to postdates.  Subjective: Feeling a little more comfortable after IV pain medications.  Still feeling contractions and some pressure with ctx.  Objective: BP 119/76  Pulse 69  Temp 98.2 F (36.8 C) (Oral)  Resp 20  Ht 4\' 11"  (1.499 m)  Wt 66.225 kg (146 lb)  BMI 29.49 kg/m2  LMP 01/03/2011      FHT:  FHR: 130 bpm, variability: minimal ,  accelerations:  Present,  decelerations:  Absent UC:   regular, every 1-3 minutes SVE:   Dilation: 5 Effacement (%): 90 Station: -1 Exam by:: Nickolaus Bordelon  Labs: Lab Results  Component Value Date   WBC 7.8 10/17/2011   HGB 10.8* 10/17/2011   HCT 32.8* 10/17/2011   MCV 85.2 10/17/2011   PLT 66* 10/17/2011    Assessment / Plan: IOL for postdates; s/p cytotec x1 resulting in good ctx pattern, now AROM'ed with clear fluid; palpable forebag that was left intact  Labor: Progressing normally Preeclampsia:  n/a Fetal Wellbeing:  Category I Pain Control:  Stadol and nubain PRN I/D:  n/a Anticipated MOD:  NSVD  Levora Werden 10/17/2011, 8:09 PM

## 2011-10-17 NOTE — Progress Notes (Signed)
Michelle Macias is a 21 y.o. [id number] at [redacted]w[redacted]d admitted for induction of labor due to postdates.  Subjective: Ctx are significantly stronger  Objective: BP 107/72  Pulse 75  Temp 98.2 F (36.8 C) (Oral)  Resp 18  Ht 4\' 11"  (1.499 m)  Wt 66.225 kg (146 lb)  BMI 29.49 kg/m2  LMP 01/03/2011      FHT:  FHR: 135 bpm, variability: moderate,  accelerations:  Present,  decelerations:  Absent UC:   regular, every 1-3 minutes SVE:   Dilation: 3 Effacement (%): 70 Station: -2 Exam by:: B.Smith RN  Labs: Lab Results  Component Value Date   WBC 7.8 10/17/2011   HGB 10.8* 10/17/2011   HCT 32.8* 10/17/2011   MCV 85.2 10/17/2011   PLT 66* 10/17/2011    Assessment / Plan: IOL for postdates, s/p cytotec x1, now 3cm, contracting q1-3 so unable to start pitocin  Labor: Progressing normally and unable to AROM since head not well applied, past FB stage, unable to pit 2/2 frequent ctx and no IUPC, will watch for now Preeclampsia:  n/a Fetal Wellbeing:  Category I Pain Control:  Labor support without medications I/D:  n/a Anticipated MOD:  NSVD  Michelle Macias 10/17/2011, 5:17 PM

## 2011-10-17 NOTE — H&P (Signed)
Michelle Macias is a 21 y.o. female [phone] @ 41.0 presenting for IOL for post-dates.  Pt followed in [facility] for h/o preterm delivery at 32 weeks, received 17=P.  Has carried subsequent pregnancy to 39 weeks and is now 41 weeks.  + FM, no LOF or VB, feeling some mild contractions irregularly   Maternal Medical History:  Contractions: Frequency: irregular.   Perceived severity is mild.    Fetal activity: Perceived fetal activity is normal.   Last perceived fetal movement was within the past hour.    Prenatal complications: Thrombocytopenia.   No bleeding, hypertension, infection, pre-eclampsia or substance abuse.   Prenatal Complications - Diabetes: none.    OB History    Grav Para Term Preterm Abortions TAB SAB Ect Mult Living   4 2 1 1 1  1   2      Past Medical History  Diagnosis Date  . Preterm labor   . Anemia   . Hx of thrombocytopenia     in pregnancy   Past Surgical History  Procedure Date  . Hernia repair     Inguinal 21 yo   Family History: family history includes Cancer in her maternal grandmother and Diabetes in her maternal grandfather.  There is no history of Other. Social History:  reports that she has never smoked. She has never used smokeless tobacco. She reports that she does not drink alcohol or use illicit drugs.   Prenatal Transfer Tool  Maternal Diabetes: No Genetic Screening: Declined Maternal Ultrasounds/Referrals: Normal Fetal Ultrasounds or other Referrals:  None Maternal Substance Abuse:  No Significant Maternal Medications:  None Significant Maternal Lab Results:  Lab values include: Group B Strep negative Other Comments:  None  ROS  Dilation: 1.5 Effacement (%): 50 Station: -2 Blood pressure 117/80, pulse 80, temperature 98.6 F (37 C), temperature source Oral, resp. rate 16, height 4\' 11"  (1.499 m), weight 66.225 kg (146 lb), last menstrual period 01/03/2011. Maternal Exam:  Uterine Assessment: Contraction strength is mild.  Contraction  frequency is irregular.   Abdomen: Fetal presentation: vertex  Introitus: Normal vagina.  Pelvis: adequate for delivery.   Cervix: Cervix evaluated by digital exam.     Fetal Exam Fetal Monitor Review: Baseline rate: 130-140.  Variability: moderate (6-25 bpm).   Pattern: accelerations present and no decelerations.    Fetal State Assessment: Category I - tracings are normal.     Physical Exam  Constitutional: She is oriented to person, place, and time. She appears well-developed and well-nourished. No distress.  HENT:  Head: Normocephalic and atraumatic.  Cardiovascular: Normal rate, regular rhythm, normal heart sounds and intact distal pulses.   No murmur heard. Respiratory: Effort normal and breath sounds normal. No respiratory distress.  Musculoskeletal: She exhibits no edema.  Neurological: She is alert and oriented to person, place, and time.  Skin: Skin is warm and dry.    Prenatal labs: ABO, Rh: O/Positive/-- (03/11 0000) Antibody: Negative (03/11 0000) Rubella: Immune (03/11 0000) RPR: NON REAC (06/10 1029)  HBsAg: Negative (03/11 0000)  HIV: Non-reactive, Non-reactive (03/11 0000)  GBS: NEGATIVE (08/26 1102)   Assessment/Plan: 21 yo [id number] @ 41.0 admitted for IOL for postdates -Admit to L&D -Will ripen cervix with cytotec first, then likely foley bulb and pitocin -Vertex presentation confirmed by bedside ultrasound  -GBS negative -Of note, pt with h/o thrombocytopenia preventing epidural with previous 2 pregnancies; plt are 66, will need IV pain medications for pain control during labor, will not be able to have epidural  -Category  1 fetal tracing  -Desires BTL (30 day papers signed and scanned) -Breast feed  -Female, no circ  Chelle Cayton 10/17/2011, 9:46 AM

## 2011-10-17 NOTE — H&P (Signed)
Agree with note ARNOLD,JAMES  

## 2011-10-17 NOTE — Progress Notes (Signed)
Dr. Fara Boros notified of pt with increased feeling of pressure, pushing with UC's and no change in SVE; Informed that IV pain meds not available to give at this time. Order received to give Nubain 10 mg IV now.

## 2011-10-18 ENCOUNTER — Encounter (HOSPITAL_COMMUNITY): Payer: Self-pay

## 2011-10-18 DIAGNOSIS — D696 Thrombocytopenia, unspecified: Secondary | ICD-10-CM

## 2011-10-18 DIAGNOSIS — D689 Coagulation defect, unspecified: Secondary | ICD-10-CM

## 2011-10-18 DIAGNOSIS — O480 Post-term pregnancy: Secondary | ICD-10-CM

## 2011-10-18 DIAGNOSIS — O9912 Other diseases of the blood and blood-forming organs and certain disorders involving the immune mechanism complicating childbirth: Secondary | ICD-10-CM

## 2011-10-18 LAB — PROTEIN / CREATININE RATIO, URINE: Creatinine, Urine: 35.58 mg/dL

## 2011-10-18 MED ORDER — FLEET ENEMA 7-19 GM/118ML RE ENEM: 1.0000 | ENEMA | Freq: Every day | RECTAL | Status: DC | PRN

## 2011-10-18 MED ORDER — DIPHENHYDRAMINE HCL 50 MG/ML IJ SOLN: 12.5000 mg | Freq: Four times a day (QID) | INTRAMUSCULAR | Status: DC | PRN

## 2011-10-18 MED ORDER — DIPHENHYDRAMINE HCL 25 MG PO CAPS: 25.0000 mg | ORAL_CAPSULE | Freq: Four times a day (QID) | ORAL | Status: DC | PRN

## 2011-10-18 MED ORDER — NALOXONE HCL 0.4 MG/ML IJ SOLN: 0.4000 mg | INTRAMUSCULAR | Status: DC | PRN

## 2011-10-18 MED ORDER — INFLUENZA VIRUS VACC SPLIT PF IM SUSP
0.5000 mL | INTRAMUSCULAR | Status: AC
  Administered 2011-10-19: 0.5 mL via INTRAMUSCULAR

## 2011-10-18 MED ORDER — OXYTOCIN 40 UNITS IN LACTATED RINGERS INFUSION - SIMPLE MED: 62.5000 mL/h | INTRAVENOUS | Status: DC | PRN

## 2011-10-18 MED ORDER — DIPHENHYDRAMINE HCL 12.5 MG/5ML PO ELIX
12.5000 mg | ORAL_SOLUTION | Freq: Four times a day (QID) | ORAL | Status: DC | PRN
  Filled 2011-10-18: qty 5

## 2011-10-18 MED ORDER — OXYCODONE-ACETAMINOPHEN 5-325 MG PO TABS
1.0000 | ORAL_TABLET | ORAL | Status: DC | PRN
  Administered 2011-10-18 – 2011-10-19 (×4): 1 via ORAL
  Filled 2011-10-18 (×4): qty 1

## 2011-10-18 MED ORDER — WITCH HAZEL-GLYCERIN EX PADS: 1.0000 "application " | MEDICATED_PAD | CUTANEOUS | Status: DC | PRN

## 2011-10-18 MED ORDER — SODIUM CHLORIDE 0.9 % IJ SOLN: 9.0000 mL | INTRAMUSCULAR | Status: DC | PRN

## 2011-10-18 MED ORDER — OXYTOCIN 40 UNITS IN LACTATED RINGERS INFUSION - SIMPLE MED
1.0000 m[IU]/min | INTRAVENOUS | Status: DC
  Administered 2011-10-18: 1 m[IU]/min via INTRAVENOUS
  Filled 2011-10-18: qty 1000

## 2011-10-18 MED ORDER — SODIUM CHLORIDE 0.9 % IV SOLN: 250.0000 mL | INTRAVENOUS | Status: DC | PRN

## 2011-10-18 MED ORDER — ZOLPIDEM TARTRATE 5 MG PO TABS: 5.0000 mg | ORAL_TABLET | Freq: Every evening | ORAL | Status: DC | PRN

## 2011-10-18 MED ORDER — SODIUM CHLORIDE 0.9 % IJ SOLN: 3.0000 mL | INTRAMUSCULAR | Status: DC | PRN

## 2011-10-18 MED ORDER — LANOLIN HYDROUS EX OINT: TOPICAL_OINTMENT | CUTANEOUS | Status: DC | PRN

## 2011-10-18 MED ORDER — SODIUM CHLORIDE 0.9 % IJ SOLN: 3.0000 mL | Freq: Two times a day (BID) | INTRAMUSCULAR | Status: DC

## 2011-10-18 MED ORDER — PRENATAL MULTIVITAMIN CH
1.0000 | ORAL_TABLET | Freq: Every day | ORAL | Status: DC
  Administered 2011-10-18 – 2011-10-19 (×2): 1 via ORAL
  Filled 2011-10-18 (×2): qty 1

## 2011-10-18 MED ORDER — TERBUTALINE SULFATE 1 MG/ML IJ SOLN: 0.2500 mg | Freq: Once | INTRAMUSCULAR | Status: DC | PRN

## 2011-10-18 MED ORDER — MEASLES, MUMPS & RUBELLA VAC ~~LOC~~ INJ: 0.5000 mL | INJECTION | Freq: Once | SUBCUTANEOUS | Status: DC

## 2011-10-18 MED ORDER — BENZOCAINE-MENTHOL 20-0.5 % EX AERO
1.0000 "application " | INHALATION_SPRAY | CUTANEOUS | Status: DC | PRN
  Administered 2011-10-18: 1 via TOPICAL
  Filled 2011-10-18: qty 56

## 2011-10-18 MED ORDER — ONDANSETRON HCL 4 MG/2ML IJ SOLN: 4.0000 mg | INTRAMUSCULAR | Status: DC | PRN

## 2011-10-18 MED ORDER — METHYLERGONOVINE MALEATE 0.2 MG/ML IJ SOLN
0.2000 mg | Freq: Once | INTRAMUSCULAR | Status: DC
  Filled 2011-10-18: qty 1

## 2011-10-18 MED ORDER — TETANUS-DIPHTH-ACELL PERTUSSIS 5-2.5-18.5 LF-MCG/0.5 IM SUSP: 0.5000 mL | Freq: Once | INTRAMUSCULAR | Status: DC

## 2011-10-18 MED ORDER — SENNOSIDES-DOCUSATE SODIUM 8.6-50 MG PO TABS
2.0000 | ORAL_TABLET | Freq: Every day | ORAL | Status: DC
  Administered 2011-10-18: 2 via ORAL

## 2011-10-18 MED ORDER — SIMETHICONE 80 MG PO CHEW: 80.0000 mg | CHEWABLE_TABLET | ORAL | Status: DC | PRN

## 2011-10-18 MED ORDER — ONDANSETRON HCL 4 MG/2ML IJ SOLN: 4.0000 mg | Freq: Four times a day (QID) | INTRAMUSCULAR | Status: DC | PRN

## 2011-10-18 MED ORDER — FENTANYL 10 MCG/ML IV SOLN
INTRAVENOUS | Status: DC
Start: 2011-10-18 — End: 2011-10-18
  Administered 2011-10-18: 01:00:00 via INTRAVENOUS
  Filled 2011-10-18: qty 50

## 2011-10-18 MED ORDER — ONDANSETRON HCL 4 MG PO TABS: 4.0000 mg | ORAL_TABLET | ORAL | Status: DC | PRN

## 2011-10-18 MED ORDER — DIBUCAINE 1 % RE OINT: 1.0000 "application " | TOPICAL_OINTMENT | RECTAL | Status: DC | PRN

## 2011-10-18 MED ORDER — BISACODYL 10 MG RE SUPP: 10.0000 mg | Freq: Every day | RECTAL | Status: DC | PRN

## 2011-10-18 NOTE — Progress Notes (Signed)
Michelle Macias is a 21 y.o. [id number] at [redacted]w[redacted]d admitted for induction of labor due to Post dates. Due date 9/21.  Subjective: Fentanyl PCA helping to rest some b/w uc's, only mild relief of uc's  Objective: BP 117/77  Pulse 73  Temp 98.3 F (36.8 C) (Oral)  Resp 16  Ht 4\' 11"  (1.499 m)  Wt 66.225 kg (146 lb)  BMI 29.49 kg/m2  SpO2 96%  LMP 01/03/2011      FHT:  FHR: 135 bpm, variability: moderate,  accelerations:  Abscent,  decelerations:  Present mild variables, earlies UC:   Irregular, q 2-7 mins SVE:   Dilation: 7.5 Effacement (%): 90 Station: -1;0 Exam by:: Lilli Few, RN  Labs: Lab Results  Component Value Date   WBC 7.8 10/17/2011   HGB 10.8* 10/17/2011   HCT 32.8* 10/17/2011   MCV 85.2 10/17/2011   PLT 66* 10/17/2011    Assessment / Plan: IOL d/t postdates, received 1 dose of cytotec, progressing slowly.  Pitocin per low-dose protocol initiated  Labor: progressing slowly- low dose pitocin initiated Preeclampsia:  n/a Fetal Wellbeing:  Category II Pain Control:  Fentanyl PCA I/D:  n/a Anticipated MOD:  NSVD  Marge Duncans 10/18/2011, 1:52 AM

## 2011-10-18 NOTE — Progress Notes (Signed)
Michelle Macias is a 21 y.o. [id number] at [redacted]w[redacted]d admitted for induction of labor due to Post dates. Due date 9/21.  Subjective: Uncomfortable w/ uc's, iv pain meds not helping anymore.  Objective: BP 125/103  Pulse 71  Temp 98.1 F (36.7 C) (Oral)  Resp 18  Ht 4\' 11"  (1.499 m)  Wt 66.225 kg (146 lb)  BMI 29.49 kg/m2  LMP 01/03/2011      FHT:  FHR: 130 bpm, variability: moderate,  accelerations:  Present,  decelerations:  Present mild variables UC:   irregular, every 1.5-5 minutes SVE:   Dilation: 7 Effacement (%): 90 Station: -1 Exam by:: Dr. Fara Boros  Unable to get epidural d/t platelets 66.    Labs: Lab Results  Component Value Date   WBC 7.8 10/17/2011   HGB 10.8* 10/17/2011   HCT 32.8* 10/17/2011   MCV 85.2 10/17/2011   PLT 66* 10/17/2011    Assessment / Plan: IOL d/t postdates, had 1 dose of cytotec, now progressing slowly  Labor: progressing slowly, s/p arom @ 1958.  Will attempt low-dose pitocin if needed after Fentanyl PCA initiated. Preeclampsia:  n/a Fetal Wellbeing:  Category II Pain Control:  IV pain meds not helping.  Consulted Dr. Malen Gauze, anesthesia to get parameters for Fentanyl PCA I/D:  n/a Anticipated MOD:  NSVD  Michelle Macias 10/18/2011, 12:31 AM

## 2011-10-18 NOTE — Progress Notes (Signed)
Fentanyl PCA 10 mcg/ML - Order D/C'd. Amount wasted - 15 ml in sink; witnessed by Landis Martins, RN.

## 2011-10-19 LAB — CBC
Hemoglobin: 9.5 g/dL — ABNORMAL LOW (ref 12.0–15.0)
MCH: 28 pg (ref 26.0–34.0)
MCV: 86.7 fL (ref 78.0–100.0)
RBC: 3.39 MIL/uL — ABNORMAL LOW (ref 3.87–5.11)
WBC: 11 10*3/uL — ABNORMAL HIGH (ref 4.0–10.5)

## 2011-10-19 MED ORDER — OXYCODONE-ACETAMINOPHEN 5-325 MG PO TABS: 1.0000 | ORAL_TABLET | ORAL | Status: DC | PRN

## 2011-10-19 MED ORDER — SENNOSIDES-DOCUSATE SODIUM 8.6-50 MG PO TABS: 2.0000 | ORAL_TABLET | Freq: Every day | ORAL | Status: DC

## 2011-10-19 NOTE — Discharge Summary (Signed)
Obstetric Discharge Summary Reason for Admission: induction of labor for postdates Prenatal Procedures: ultrasound Intrapartum Procedures: spontaneous vaginal delivery Postpartum Procedures: none Complications-Operative and Postpartum: none Hemoglobin  Date Value Range Status  10/19/2011 9.5* 12.0 - 15.0 g/dL Final     HCT  Date Value Range Status  10/19/2011 29.4* 36.0 - 46.0 % Final    Physical Exam:  General: alert, cooperative and no distress Lochia: appropriate Uterine Fundus: firm Incision: n/a DVT Evaluation: No evidence of DVT seen on physical exam.  Discharge Diagnoses: Term Pregnancy-delivered and Post-date pregnancy  Discharge Information: Date: 10/19/2011 Activity: pelvic rest Diet: routine Medications: PNV, Colace and Percocet Condition: stable Instructions: refer to practice specific booklet Discharge to: home   Newborn Data: Live born female  Birth Weight: 7 lb 12.2 oz (3520 g) APGAR: 8, 8  Home with mother.  Patient desires BTL, but has platelets of 61.  Will return in 2 weeks to [facility] to have labs rechecked and have BTL scheduled as an outpatient.   Pheng Prokop 10/19/2011, 7:39 AM

## 2011-10-19 NOTE — Progress Notes (Signed)
Ur chart review completed.  

## 2011-10-21 LAB — TYPE AND SCREEN
Antibody Screen: NEGATIVE
Unit division: 0

## 2011-10-26 NOTE — Progress Notes (Signed)
NST Reactive on 10/15/11

## 2011-10-26 NOTE — Discharge Summary (Signed)
Pt seen and examined with the resident.  Lanard Arguijo H.

## 2011-11-19 ENCOUNTER — Ambulatory Visit (INDEPENDENT_AMBULATORY_CARE_PROVIDER_SITE_OTHER): Payer: Medicaid Other | Admitting: Obstetrics and Gynecology

## 2011-11-19 ENCOUNTER — Encounter: Payer: Self-pay | Admitting: Obstetrics and Gynecology

## 2011-11-19 ENCOUNTER — Ambulatory Visit: Payer: Medicaid Other | Admitting: Family Medicine

## 2011-11-19 VITALS — BP 119/79 | HR 78 | Temp 98.5°F | Ht 60.0 in | Wt 131.2 lb

## 2011-11-19 DIAGNOSIS — D696 Thrombocytopenia, unspecified: Secondary | ICD-10-CM

## 2011-11-19 NOTE — Progress Notes (Signed)
  Subjective:     Michelle Macias is a 21 y.o. female who presents for a postpartum visit. She is 5 weeks postpartum following a spontaneous vaginal delivery. I have fully reviewed the prenatal and intrapartum course. The delivery was at 41 gestational weeks. Outcome: spontaneous vaginal delivery. Anesthesia: none. Postpartum course has been uncomplicated. Baby's course has been uncomplicated. Baby is feeding by breast. Bleeding no bleeding. Bowel function is normal. Bladder function is normal. Patient is sexually active with condoms. Contraception method is patient desires BTL. BTL could not be performed postpartum due to thrombocytopenia (plt 61). Postpartum depression screening: negative.     Review of Systems A comprehensive review of systems was negative.   Objective:    BP 119/79  Pulse 78  Temp 98.5 F (36.9 C) (Oral)  Ht 5' (1.524 m)  Wt 131 lb 3.2 oz (59.512 kg)  BMI 25.62 kg/m2  Breastfeeding? Yes  General:  alert, cooperative and no distress   Breasts:  inspection negative, no nipple discharge or bleeding, no masses or nodularity palpable  Lungs: clear to auscultation bilaterally  Heart:  regular rate and rhythm  Abdomen: soft, non-tender; bowel sounds normal; no masses,  no organomegaly   Vulva:  normal  Vagina: normal vagina, no discharge, exudate, lesion, or erythema  Cervix:  multiparous appearance  Corpus: normal size, contour, position, consistency, mobility, non-tender  Adnexa:  no mass, fullness, tenderness  Rectal Exam: Not performed.        Assessment:    Normal postpartum exam. Pap smear not done at today's visit.   Plan:    1. Contraception: tubal ligation. Alternative methods of birth control were discussed such as IUD. Patient desired BTL. Risks benefits and alternatives were explained including but not limited to risks of bleeding, infection and damage to adjacent organs. 2. Patient will be contacted by surgical secretary with date of surgery. CBC was  ordered today to check platelet count 3. Follow up in: post-op  or as needed.

## 2011-11-20 LAB — CBC
HCT: 36.8 % (ref 36.0–46.0)
Hemoglobin: 12.2 g/dL (ref 12.0–15.0)
MCHC: 33.2 g/dL (ref 30.0–36.0)
MCV: 82 fL (ref 78.0–100.0)
RDW: 15.4 % (ref 11.5–15.5)

## 2011-11-23 ENCOUNTER — Telehealth: Payer: Self-pay | Admitting: *Deleted

## 2011-11-23 ENCOUNTER — Encounter: Payer: Self-pay | Admitting: *Deleted

## 2011-11-23 NOTE — Telephone Encounter (Signed)
Pt left message stating that she would like a letter stating that she may return to work.  I prepared letter and called pt.  She voiced understanding and stated that she will pick up the letter tomorrow.

## 2011-11-27 ENCOUNTER — Encounter (HOSPITAL_COMMUNITY): Payer: Self-pay | Admitting: Pharmacist

## 2011-12-07 NOTE — H&P (Signed)
Michelle Macias is an 21 y.o. female [phone] with undesired fertility presenting today for scheduled laparoscopic tubal ligation. Patient with severe thrombocytopenia postpartum which prevented pp BTL to be performed. Last Platelet count on 10/31 was 117  Pertinent Gynecological History: Menses: flow is moderate Bleeding: monthly Contraception: condoms DES exposure: denies Blood transfusions: none Sexually transmitted diseases: no past history Previous GYN Procedures: DNC  Last mammogram: n/a Last pap: normal OB History: G4, [id number]   Menstrual History: No LMP recorded.    Past Medical History  Diagnosis Date  . Preterm labor   . Anemia   . Hx of thrombocytopenia     in pregnancy    Past Surgical History  Procedure Date  . Hernia repair     Inguinal 21 yo    Family History  Problem Relation Age of Onset  . Cancer Maternal Grandmother     breast cancer  . Diabetes Maternal Grandfather   . Other Neg Hx     Social History:  reports that she has never smoked. She has never used smokeless tobacco. She reports that she does not drink alcohol or use illicit drugs.  Allergies: No Known Allergies  No prescriptions prior to admission    Review of Systems  All other systems reviewed and are negative.    Height 4\' 11"  (1.499 m), weight 131 lb (59.421 kg), currently breastfeeding. Physical Exam GENERAL: Well-developed, well-nourished female in no acute distress.  HEENT: Normocephalic, atraumatic. Sclerae anicteric.  NECK: Supple. Normal thyroid.  LUNGS: Clear to auscultation bilaterally.  HEART: Regular rate and rhythm. BREASTS: Symmetric in size. No palpable masses or lymphadenopathy, skin changes, or nipple drainage. ABDOMEN: Soft, nontender, nondistended. No organomegaly. PELVIC: Deferred to OR EXTREMITIES: No cyanosis, clubbing, or edema, 2+ distal pulses.  No results found for this or any previous visit (from the past 24 hour(s)).  No results  found.  Assessment/Plan: 21 yo [id number] with undesired fertility here for laparoscopic tubal ligation. Risks and benefits of procedure discussed with patient including permanence of method, bleeding, infection, injury to surrounding organs and need for additional procedures. Risk failure of 0.5-1% with increased risk of ectopic gestation if pregnancy occurs was also discussed with patient. Patient verbalized understanding and all questions were answered.   Michelle Macias 12/07/2011, 2:27 PM

## 2011-12-08 ENCOUNTER — Encounter (HOSPITAL_COMMUNITY)
Admission: RE | Disposition: A | Discharge status: Home / Self Care | Payer: Self-pay | Source: Ambulatory Visit | Attending: Obstetrics and Gynecology

## 2011-12-08 ENCOUNTER — Encounter (HOSPITAL_COMMUNITY): Payer: Self-pay | Admitting: Anesthesiology

## 2011-12-08 ENCOUNTER — Inpatient Hospital Stay (HOSPITAL_COMMUNITY)
Admission: AD | Admit: 2011-12-08 | Discharge: 2011-12-08 | Disposition: A | Discharge status: Home / Self Care | Payer: Medicaid Other | Attending: Obstetrics & Gynecology | Admitting: Obstetrics & Gynecology

## 2011-12-08 ENCOUNTER — Ambulatory Visit (HOSPITAL_COMMUNITY): Payer: Medicaid Other | Admitting: Anesthesiology

## 2011-12-08 ENCOUNTER — Ambulatory Visit (HOSPITAL_COMMUNITY)
Admission: RE | Admit: 2011-12-08 | Discharge: 2011-12-08 | Disposition: A | Discharge status: Home / Self Care | Payer: Medicaid Other | Source: Ambulatory Visit | Attending: Obstetrics and Gynecology | Admitting: Obstetrics and Gynecology

## 2011-12-08 DIAGNOSIS — Z302 Encounter for sterilization: Secondary | ICD-10-CM

## 2011-12-08 HISTORY — PX: LAPAROSCOPIC TUBAL LIGATION: SHX1937

## 2011-12-08 LAB — CBC
Hemoglobin: 12.8 g/dL (ref 12.0–15.0)
MCH: 28.3 pg (ref 26.0–34.0)
MCV: 86.7 fL (ref 78.0–100.0)
RBC: 4.52 MIL/uL (ref 3.87–5.11)

## 2011-12-08 LAB — SURGICAL PCR SCREEN: Staphylococcus aureus: NEGATIVE

## 2011-12-08 SURGERY — LIGATION, FALLOPIAN TUBE, LAPAROSCOPIC
Anesthesia: General | Site: Abdomen | Laterality: Bilateral | Wound class: Clean Contaminated

## 2011-12-08 MED ORDER — KETOROLAC TROMETHAMINE 30 MG/ML IJ SOLN
INTRAMUSCULAR | Status: DC | PRN
  Administered 2011-12-08: 30 mg via INTRAVENOUS

## 2011-12-08 MED ORDER — LIDOCAINE HCL (CARDIAC) 20 MG/ML IV SOLN
INTRAVENOUS | Status: DC | PRN
  Administered 2011-12-08: 50 mg via INTRAVENOUS

## 2011-12-08 MED ORDER — FENTANYL CITRATE 0.05 MG/ML IJ SOLN
INTRAMUSCULAR | Status: DC | PRN
  Administered 2011-12-08: 50 ug via INTRAVENOUS
  Administered 2011-12-08: 100 ug via INTRAVENOUS

## 2011-12-08 MED ORDER — GLYCOPYRROLATE 0.2 MG/ML IJ SOLN
INTRAMUSCULAR | Status: DC | PRN
  Administered 2011-12-08: 0.2 mg via INTRAVENOUS
  Administered 2011-12-08: 0.4 mg via INTRAVENOUS

## 2011-12-08 MED ORDER — PROPOFOL 10 MG/ML IV EMUL
INTRAVENOUS | Status: AC
  Filled 2011-12-08: qty 20

## 2011-12-08 MED ORDER — LIDOCAINE HCL (CARDIAC) 20 MG/ML IV SOLN
INTRAVENOUS | Status: AC
  Filled 2011-12-08: qty 5

## 2011-12-08 MED ORDER — ROCURONIUM BROMIDE 100 MG/10ML IV SOLN
INTRAVENOUS | Status: DC | PRN
  Administered 2011-12-08: 30 mg via INTRAVENOUS

## 2011-12-08 MED ORDER — ONDANSETRON HCL 4 MG/2ML IJ SOLN
INTRAMUSCULAR | Status: AC
  Filled 2011-12-08: qty 2

## 2011-12-08 MED ORDER — FENTANYL CITRATE 0.05 MG/ML IJ SOLN
INTRAMUSCULAR | Status: AC
  Filled 2011-12-08: qty 5

## 2011-12-08 MED ORDER — NEOSTIGMINE METHYLSULFATE 1 MG/ML IJ SOLN
INTRAMUSCULAR | Status: DC | PRN
  Administered 2011-12-08: 3 mg via INTRAVENOUS

## 2011-12-08 MED ORDER — FENTANYL CITRATE 0.05 MG/ML IJ SOLN
25.0000 ug | INTRAMUSCULAR | Status: DC | PRN
  Administered 2011-12-08 (×2): 50 ug via INTRAVENOUS

## 2011-12-08 MED ORDER — LACTATED RINGERS IV SOLN
INTRAVENOUS | Status: DC
  Administered 2011-12-08 (×2): via INTRAVENOUS

## 2011-12-08 MED ORDER — MIDAZOLAM HCL 2 MG/2ML IJ SOLN
INTRAMUSCULAR | Status: AC
  Filled 2011-12-08: qty 2

## 2011-12-08 MED ORDER — OXYCODONE-ACETAMINOPHEN 5-325 MG PO TABS: 1.0000 | ORAL_TABLET | ORAL | Status: DC | PRN

## 2011-12-08 MED ORDER — PROPOFOL 10 MG/ML IV EMUL
INTRAVENOUS | Status: DC | PRN
  Administered 2011-12-08: 140 mg via INTRAVENOUS

## 2011-12-08 MED ORDER — BUPIVACAINE HCL (PF) 0.25 % IJ SOLN
INTRAMUSCULAR | Status: AC
  Filled 2011-12-08: qty 30

## 2011-12-08 MED ORDER — KETOROLAC TROMETHAMINE 30 MG/ML IJ SOLN: 15.0000 mg | Freq: Once | INTRAMUSCULAR | Status: DC | PRN

## 2011-12-08 MED ORDER — MEPERIDINE HCL 25 MG/ML IJ SOLN: 6.2500 mg | INTRAMUSCULAR | Status: DC | PRN

## 2011-12-08 MED ORDER — ONDANSETRON HCL 4 MG/2ML IJ SOLN
INTRAMUSCULAR | Status: DC | PRN
  Administered 2011-12-08: 4 mg via INTRAVENOUS

## 2011-12-08 MED ORDER — OXYCODONE-ACETAMINOPHEN 5-325 MG PO TABS
ORAL_TABLET | ORAL | Status: AC
  Administered 2011-12-08: 1 via ORAL
  Filled 2011-12-08: qty 1

## 2011-12-08 MED ORDER — GLYCOPYRROLATE 0.2 MG/ML IJ SOLN
INTRAMUSCULAR | Status: AC
  Filled 2011-12-08: qty 3

## 2011-12-08 MED ORDER — MIDAZOLAM HCL 5 MG/5ML IJ SOLN
INTRAMUSCULAR | Status: DC | PRN
  Administered 2011-12-08: 2 mg via INTRAVENOUS

## 2011-12-08 MED ORDER — BUPIVACAINE HCL (PF) 0.25 % IJ SOLN
INTRAMUSCULAR | Status: DC | PRN
  Administered 2011-12-08: 8 mL

## 2011-12-08 MED ORDER — ONDANSETRON HCL 4 MG/2ML IJ SOLN: 4.0000 mg | Freq: Once | INTRAMUSCULAR | Status: DC | PRN

## 2011-12-08 MED ORDER — MUPIROCIN 2 % EX OINT
TOPICAL_OINTMENT | Freq: Two times a day (BID) | CUTANEOUS | Status: DC
  Administered 2011-12-08: 11:00:00 via NASAL

## 2011-12-08 MED ORDER — MUPIROCIN 2 % EX OINT
TOPICAL_OINTMENT | CUTANEOUS | Status: AC
  Filled 2011-12-08: qty 22

## 2011-12-08 MED ORDER — OXYCODONE-ACETAMINOPHEN 5-325 MG PO TABS
1.0000 | ORAL_TABLET | ORAL | Status: DC | PRN
  Administered 2011-12-08: 1 via ORAL

## 2011-12-08 MED ORDER — IBUPROFEN 600 MG PO TABS: 600.0000 mg | ORAL_TABLET | Freq: Four times a day (QID) | ORAL | Status: DC | PRN

## 2011-12-08 MED ORDER — FENTANYL CITRATE 0.05 MG/ML IJ SOLN
INTRAMUSCULAR | Status: AC
  Administered 2011-12-08: 50 ug via INTRAVENOUS
  Filled 2011-12-08: qty 2

## 2011-12-08 MED ORDER — NEOSTIGMINE METHYLSULFATE 1 MG/ML IJ SOLN
INTRAMUSCULAR | Status: AC
  Filled 2011-12-08: qty 10

## 2011-12-08 MED ORDER — ROCURONIUM BROMIDE 50 MG/5ML IV SOLN
INTRAVENOUS | Status: AC
  Filled 2011-12-08: qty 1

## 2011-12-08 SURGICAL SUPPLY — 19 items
CHLORAPREP W/TINT 26ML (MISCELLANEOUS) ×2 IMPLANT
DERMABOND ADVANCED (GAUZE/BANDAGES/DRESSINGS) ×1
DERMABOND ADVANCED .7 DNX12 (GAUZE/BANDAGES/DRESSINGS) ×1 IMPLANT
DRSG COVADERM PLUS 2X2 (GAUZE/BANDAGES/DRESSINGS) ×2 IMPLANT
ELECT REM PT RETURN 9FT ADLT (ELECTROSURGICAL) ×2
ELECTRODE REM PT RTRN 9FT ADLT (ELECTROSURGICAL) ×1 IMPLANT
GLOVE BIOGEL PI IND STRL 6.5 (GLOVE) ×2 IMPLANT
GLOVE BIOGEL PI INDICATOR 6.5 (GLOVE) ×2
GLOVE SURG SS PI 6.0 STRL IVOR (GLOVE) ×2 IMPLANT
GOWN PREVENTION PLUS LG XLONG (DISPOSABLE) ×4 IMPLANT
NS IRRIG 1000ML POUR BTL (IV SOLUTION) ×2 IMPLANT
PACK LAPAROSCOPY BASIN (CUSTOM PROCEDURE TRAY) ×2 IMPLANT
SPONGE GAUZE 2X2 8PLY STRL LF (GAUZE/BANDAGES/DRESSINGS) ×2 IMPLANT
SUT MON AB 4-0 PS1 27 (SUTURE) ×2 IMPLANT
SUT VICRYL 0 UR6 27IN ABS (SUTURE) ×4 IMPLANT
TOWEL OR 17X24 6PK STRL BLUE (TOWEL DISPOSABLE) ×4 IMPLANT
TRAY FOLEY BAG SILVER LF 14FR (CATHETERS) ×2 IMPLANT
TROCAR BALLN 12MMX100 BLUNT (TROCAR) ×2 IMPLANT
WATER STERILE IRR 1000ML POUR (IV SOLUTION) ×2 IMPLANT

## 2011-12-08 NOTE — Anesthesia Postprocedure Evaluation (Signed)
Anesthesia Post Note  Patient: Michelle Macias  Procedure(s) Performed: Procedure(s) (LRB): LAPAROSCOPIC TUBAL LIGATION (Bilateral)  Anesthesia type: General  Patient location: PACU  Post pain: Pain level controlled  Post assessment: Post-op Vital signs reviewed  Last Vitals:  Filed Vitals:   12/08/11 1016  BP: 103/69  Pulse: 70  Temp: 36.7 C  Resp: 16    Post vital signs: Reviewed  Level of consciousness: sedated  Complications: No apparent anesthesia complicationsfj

## 2011-12-08 NOTE — Anesthesia Preprocedure Evaluation (Signed)
Anesthesia Evaluation  Patient identified by MRN, date of birth, ID band Patient awake    Reviewed: Allergy & Precautions, H&P , NPO status , Patient's Chart, lab work & pertinent test results  Airway Mallampati: I TM Distance: >3 FB Neck ROM: full    Dental No notable dental hx. (+) Teeth Intact   Pulmonary neg pulmonary ROS,    Pulmonary exam normal       Cardiovascular negative cardio ROS      Neuro/Psych negative neurological ROS  negative psych ROS   GI/Hepatic negative GI ROS, Neg liver ROS,   Endo/Other  negative endocrine ROS  Renal/GU negative Renal ROS  negative genitourinary   Musculoskeletal negative musculoskeletal ROS (+)   Abdominal Normal abdominal exam  (+)   Peds negative pediatric ROS (+)  Hematology negative hematology ROS (+)   Anesthesia Other Findings   Reproductive/Obstetrics negative OB ROS                           Anesthesia Physical Anesthesia Plan  ASA: II  Anesthesia Plan: General   Post-op Pain Management:    Induction: Intravenous  Airway Management Planned: Oral ETT  Additional Equipment:   Intra-op Plan:   Post-operative Plan:   Informed Consent: I have reviewed the patients History and Physical, chart, labs and discussed the procedure including the risks, benefits and alternatives for the proposed anesthesia with the patient or authorized representative who has indicated his/her understanding and acceptance.   Dental Advisory Given  Plan Discussed with: CRNA and Surgeon  Anesthesia Plan Comments:         Anesthesia Quick Evaluation  

## 2011-12-08 NOTE — Transfer of Care (Signed)
Immediate Anesthesia Transfer of Care Note  Patient: Michelle Macias  Procedure(s) Performed: Procedure(s) (LRB) with comments: LAPAROSCOPIC TUBAL LIGATION (Bilateral)  Patient Location: PACU  Anesthesia Type:General  Level of Consciousness: awake, alert  and oriented  Airway & Oxygen Therapy: Patient Spontanous Breathing and Patient connected to nasal cannula oxygen  Post-op Assessment: Report given to PACU RN and Post -op Vital signs reviewed and stable  Post vital signs: Reviewed and stable  Complications: No apparent anesthesia complications

## 2011-12-08 NOTE — Op Note (Addendum)
Shalamar Plourde 12/08/2011  PREOPERATIVE DIAGNOSIS:  Undesired fertility  POSTOPERATIVE DIAGNOSIS:  Undesired fertility  PROCEDURE:  Laparoscopic Bilateral Tubal Sterilization using bipolar energy   SURGEON: Dr. Catalina Antigua  ANESTHESIA:  General endotracheal  COMPLICATIONS:  Uterine perforation.  ESTIMATED BLOOD LOSS:  Less than 20 ml.  FLUIDS: 1400 ml LR.  URINE OUTPUT:  100 ml of clear urine.  INDICATIONS: 21 y.o. [id number]  with undesired fertility, desires permanent sterilization. Other reversible forms of contraception were discussed with patient; she declines all other modalities.  Risks of procedure discussed with patient including permanence of method, bleeding, infection, injury to surrounding organs and need for additional procedures including laparotomy, risk of regret.  Failure risk of 0.5-1% with increased risk of ectopic gestation if pregnancy occurs was also discussed with patient.      FINDINGS:  Normal uterus, tubes, and ovaries.  TECHNIQUE:  The patient was taken to the operating room where general anesthesia was obtained without difficulty.  She was then placed in the dorsal lithotomy position and prepared and draped in sterile fashion.  After an adequate timeout was performed, a bivalved speculum was then placed in the patient's vagina, and the anterior lip of cervix grasped with the single-tooth tenaculum.  The uterine manipulator was then advanced into the uterus.  The speculum was removed from the vagina.  Attention was then turned to the patient's abdomen where a 10-mm skin incision was made on the umbilical fold.  The underlying fascia was identified, grasped with Kocher clamps, tented up and entered sharply with mayo scissors. The fascia was tagged with 0-Vicryl. The peritoneum was entered bluntly.The 11 mm trocar and sleeve were introduced into the abdominal cavityl.  Intraperitoneal placement was confirmed with the use of the laparoscope. Pneumoperitoneum was  achieved by the insufflation of CO2 gas. A survey of the patient's pelvis and abdomen revealed entirely normal anatomy.  Upon using the uterine manipulator, a uterine perforation was noted, the manipulator was repositioned. The fallopian tubes were observed and found to be normal in appearance. Bipolar forceps was then advanced through the operative port and used to coagulate a 3-cm portion of the left tube in the mid isthmic area.  Good blanching and coagulation was noted at the site of the application.  There was no bleeding noted in the mesosalpinx.  A similar process was carried out on the right fallopian tube, allowing for bilateral tubal sterilization.   Good hemostasis was noted overall. Hemostasis was also noted at the site of the uterine perforation. The instruments were then removed from the patient's abdomen and the fascial incision was repaired with 0 Vicryl, and the skin was closed with 4-0 Vicryl. The uterine manipulator and the tenaculum were removed from the vagina without complications. The patient tolerated the procedure well.  Sponge, lap, and needle counts were correct times two.  The patient was then taken to the recovery room awake, extubated and in stable  in stable condition.

## 2011-12-09 ENCOUNTER — Encounter (HOSPITAL_COMMUNITY): Payer: Self-pay | Admitting: Obstetrics and Gynecology

## 2011-12-11 ENCOUNTER — Telehealth: Payer: Self-pay | Admitting: Medical

## 2011-12-11 ENCOUNTER — Inpatient Hospital Stay (HOSPITAL_COMMUNITY)
Admission: AD | Admit: 2011-12-11 | Discharge: 2011-12-11 | Disposition: A | Discharge status: Home / Self Care | Payer: Medicaid Other | Source: Ambulatory Visit | Attending: Obstetrics & Gynecology | Admitting: Obstetrics & Gynecology

## 2011-12-11 ENCOUNTER — Encounter (HOSPITAL_COMMUNITY): Payer: Self-pay | Admitting: *Deleted

## 2011-12-11 DIAGNOSIS — T8149XA Infection following a procedure, other surgical site, initial encounter: Secondary | ICD-10-CM | POA: Diagnosis present

## 2011-12-11 DIAGNOSIS — R109 Unspecified abdominal pain: Secondary | ICD-10-CM | POA: Insufficient documentation

## 2011-12-11 DIAGNOSIS — G8918 Other acute postprocedural pain: Secondary | ICD-10-CM | POA: Insufficient documentation

## 2011-12-11 LAB — URINALYSIS, ROUTINE W REFLEX MICROSCOPIC
Ketones, ur: NEGATIVE mg/dL
Leukocytes, UA: NEGATIVE
Nitrite: NEGATIVE
Protein, ur: NEGATIVE mg/dL

## 2011-12-11 LAB — URINE MICROSCOPIC-ADD ON

## 2011-12-11 MED ORDER — SULFAMETHOXAZOLE-TRIMETHOPRIM 800-160 MG PO TABS: 1.0000 | ORAL_TABLET | Freq: Two times a day (BID) | ORAL | Status: DC

## 2011-12-11 MED ORDER — KETOROLAC TROMETHAMINE 60 MG/2ML IM SOLN
60.0000 mg | INTRAMUSCULAR | Status: AC
  Administered 2011-12-11: 60 mg via INTRAMUSCULAR
  Filled 2011-12-11: qty 2

## 2011-12-11 MED ORDER — SULFAMETHOXAZOLE-TMP DS 800-160 MG PO TABS
1.0000 | ORAL_TABLET | ORAL | Status: AC
  Administered 2011-12-11: 1 via ORAL
  Filled 2011-12-11: qty 1

## 2011-12-11 NOTE — Telephone Encounter (Signed)
Spoke with patient. She describes an increase in swelling at the surgical site that has been growing by the day since the surgery. She says that it is extremely tender to the touch. She denies surrounding redness or heat at the site. I have asked her to go to MAU for evaluation of the surgical site. I am concerned with the amount of pain she is describing and feel that she may have a hematoma that would need to be drained. The patient has voiced understanding and plans to go to MAU today. She had no further questions or concerns at this time.

## 2011-12-11 NOTE — MAU Note (Signed)
Had BTL surgery on Tuesday and is c/o swelling and pain increasing on R side since the surgery;

## 2011-12-11 NOTE — MAU Note (Signed)
Patient states she had a BTL pm 11-19 done by Dr. Jolayne Panther. States she has had increasing pain in the right side of the abdomen since the surgery. States she started bleeding after the surgery and it continues. States abdomen seems bloated. Patient had a vaginal delivery on 9-29 and continues to breast feed.

## 2011-12-11 NOTE — MAU Provider Note (Signed)
Attestation of Attending Supervision of Advanced Practitioner (CNM/NP): Evaluation and management procedures were performed by the Advanced Practitioner under my supervision and collaboration.  I have reviewed the Advanced Practitioner's note and chart, and I agree with the management and plan.  On my exam of the patient, I noted blanching erythema and tenderness around incision concerning for possible cellulitis.  She will be treated with Bactrim DS x 7 days. Rest of abdominal exam was benign. She was told to come back with any fevers, worsening pain or other symptoms.  Michelle Collins, MD, FACOG Attending Obstetrician & Gynecologist Faculty Practice, [facility]

## 2011-12-11 NOTE — MAU Provider Note (Signed)
Chief Complaint: Abdominal Pain and Vaginal Bleeding   First Provider Initiated Contact with Patient 12/11/11 1743     SUBJECTIVE HPI: Michelle Macias is a 21 y.o. [id number] who presents to maternity admissions reporting postoperative pain following at tubal ligation.  She had a laproscopic BTL 3 days ago and reports onset of pain of right lower abdomen and right flank the day after surgery.  Her pain has worsened over the last 2 days, making it difficult for her to walk. She is taking Percocet which was prescribed following her surgery, and this does reduce the pain.  She reports regular bowel movements, no changes in bowel habits following surgery, and denies any problems with digestion or urination.  She denies vaginal bleeding, vaginal itching/burning, urinary symptoms, h/a, dizziness, n/v, or fever/chills.     Past Medical History  Diagnosis Date  . Preterm labor   . Anemia   . Hx of thrombocytopenia     in pregnancy   Past Surgical History  Procedure Date  . Hernia repair     Inguinal 21 yo  . Laparoscopic tubal ligation 12/08/2011    Procedure: LAPAROSCOPIC TUBAL LIGATION;  Surgeon: Catalina Antigua, MD;  Location: [facility];  Service: Gynecology;  Laterality: Bilateral;   History   Social History  . Marital Status: Single    Spouse Name: N/A    Number of Children: N/A  . Years of Education: N/A   Occupational History  . Not on file.   Social History Main Topics  . Smoking status: Never Smoker   . Smokeless tobacco: Never Used  . Alcohol Use: No  . Drug Use: No  . Sexually Active: Not Currently     Comment: tubal   Other Topics Concern  . Not on file   Social History Narrative  . No narrative on file   No current facility-administered medications on file prior to encounter.   Current Outpatient Prescriptions on File Prior to Encounter  Medication Sig Dispense Refill  . ibuprofen (ADVIL,MOTRIN) 600 MG tablet Take 1 tablet (600 mg total) by mouth every 6 (six) hours  as needed for pain.  30 tablet  1  . oxyCODONE-acetaminophen (PERCOCET/ROXICET) 5-325 MG per tablet Take 1 tablet by mouth every 4 (four) hours as needed for pain.  20 tablet  0  . Prenatal Vit-Fe Fumarate-FA (PRENATAL MULTIVITAMIN) 60-1 MG tablet Take 1 tablet by mouth daily.       No Known Allergies  ROS: Pertinent items in HPI  OBJECTIVE Blood pressure 108/67, pulse 68, temperature 98.2 F (36.8 C), temperature source Oral, resp. rate 16, height 4\' 11"  (1.499 m), weight 58.605 kg (129 lb 3.2 oz), SpO2 100.00%, currently breastfeeding. GENERAL: Well-developed, well-nourished female in no acute distress.  HEENT: Normocephalic Chest - clear to auscultation, no wheezes, rales or rhonchi, symmetric air entry Heart - normal rate, regular rhythm, normal S1, S2, no murmurs, rubs, clicks or gallops Abdomen - tenderness noted around umbilicus and in RLQ, abdomen soft, without distension no rebound tenderness noted bowel sounds normal no CVA tenderness Musculoskeletal - no joint tenderness, deformity or swelling, full range of motion without pain Skin - normal coloration and turgor, no rashes, no suspicious skin lesions noted EXTREMITIES: Nontender, no edema NEURO: Alert and oriented Pelvic exam: Deferred  LAB RESULTS Results for orders placed during the hospital encounter of 12/11/11 (from the past 24 hour(s))  URINALYSIS, ROUTINE W REFLEX MICROSCOPIC     Status: Abnormal   Collection Time   12/11/11  4:15 PM  Component Value Range   Color, Urine YELLOW  YELLOW   APPearance CLEAR  CLEAR   Specific Gravity, Urine 1.025  1.005 - 1.030   pH 6.5  5.0 - 8.0   Glucose, UA NEGATIVE  NEGATIVE mg/dL   Hgb urine dipstick LARGE (*) NEGATIVE   Bilirubin Urine NEGATIVE  NEGATIVE   Ketones, ur NEGATIVE  NEGATIVE mg/dL   Protein, ur NEGATIVE  NEGATIVE mg/dL   Urobilinogen, UA 0.2  0.0 - 1.0 mg/dL   Nitrite NEGATIVE  NEGATIVE   Leukocytes, UA NEGATIVE  NEGATIVE  URINE MICROSCOPIC-ADD ON      Status: Abnormal   Collection Time   12/11/11  4:15 PM      Component Value Range   Squamous Epithelial / LPF MANY (*) RARE   WBC, UA 3-6  <3 WBC/hpf   RBC / HPF 3-6  <3 RBC/hpf   Bacteria, UA MANY (*) RARE   Urine-Other MUCOUS PRESENT      A: 1. Postoperative pain   2. Incisional infection     P:  Discussed assessment and findings with Dr Macon Large Dr Macon Large assessed pt   D/C home Bactrim DS BID x7 days, first dose given in MAU Ibuprofen 600 mg Q 6 hours Percocet PRN Return to MAU as needed  Sharen Counter Certified Nurse-Midwife 12/11/2011  5:46 PM

## 2011-12-11 NOTE — Telephone Encounter (Signed)
Patient called stating that she had BTL on Tuesday and has questions about the swelling around her belly button.

## 2011-12-13 LAB — URINE CULTURE: Colony Count: 50000

## 2011-12-28 ENCOUNTER — Ambulatory Visit: Payer: Medicaid Other | Admitting: Obstetrics and Gynecology

## 2012-05-18 IMAGING — US US OB DETAIL+14 WK
1 series · 12 of 28 positions shown · non-contrast
Comparison: none

[Series 1: us ob detail +14 wk · 12 of 114 slices shown]
[im 5/114]
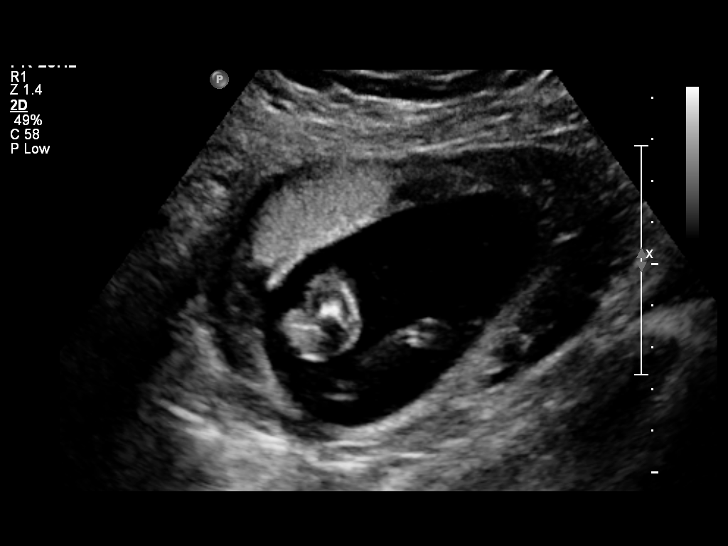
[im 13/114]
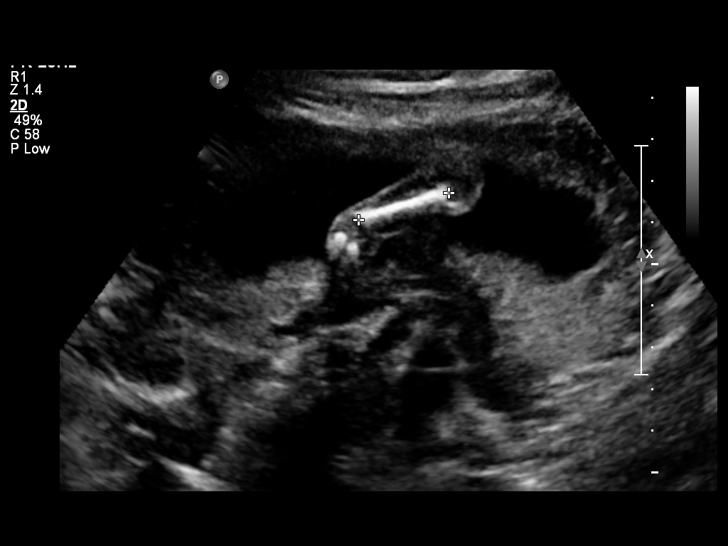
[im 21/114]
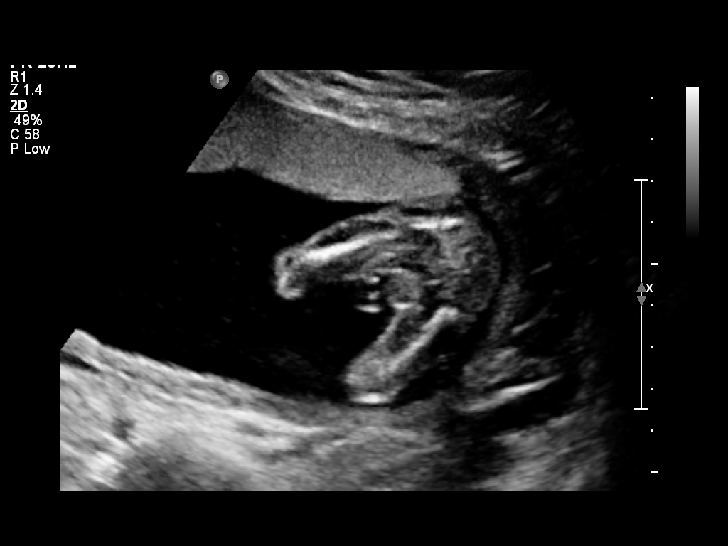
[im 34/114]
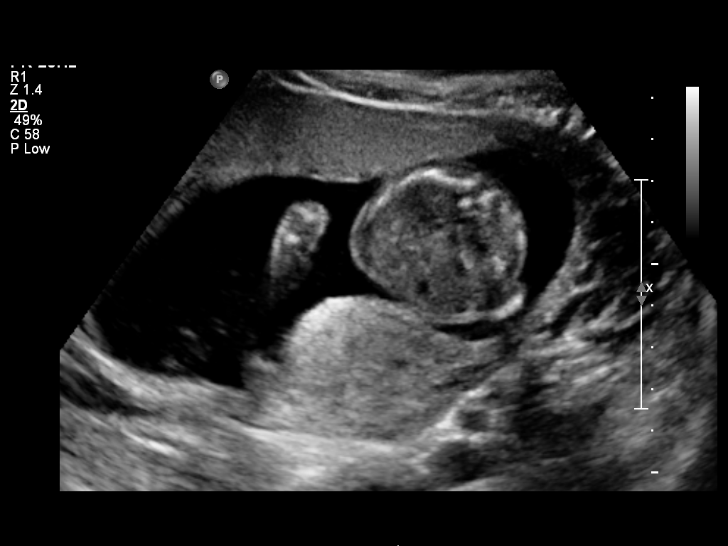
[im 42/114]
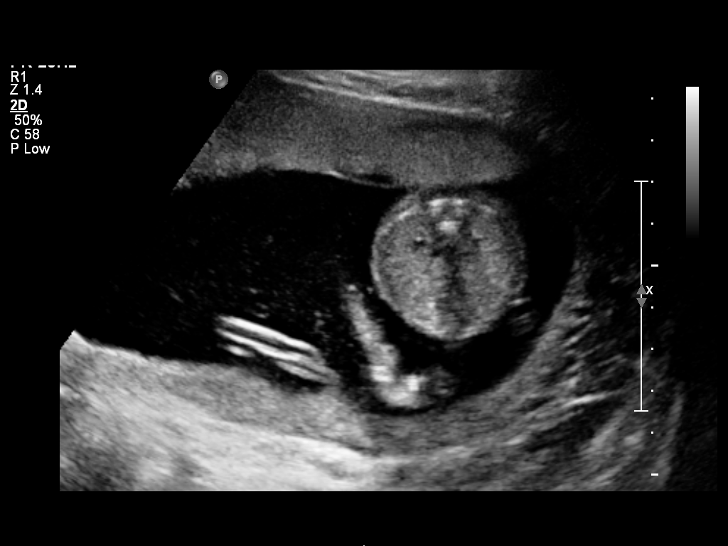
[im 51/114]
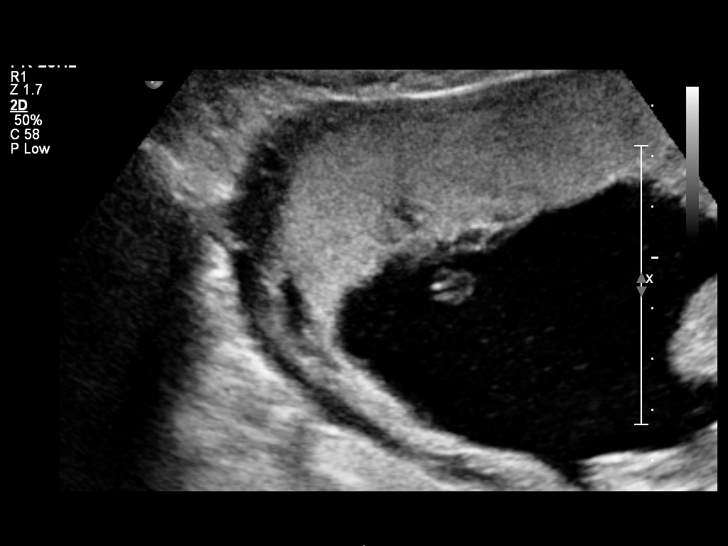
[im 63/114]
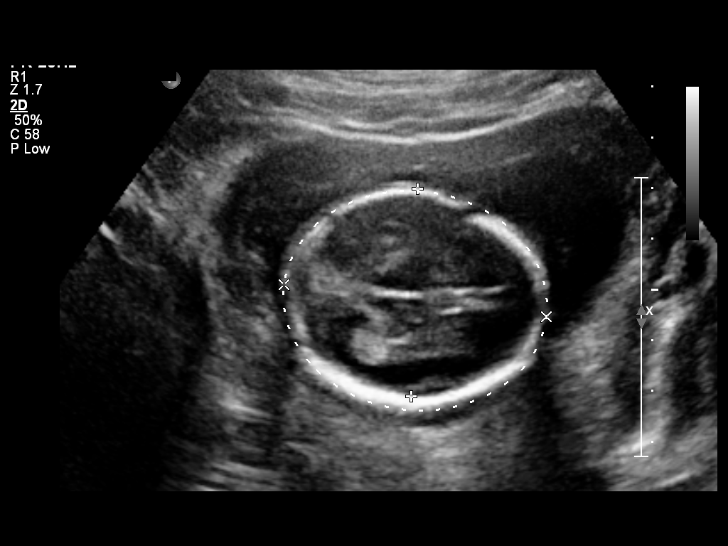
[im 72/114]
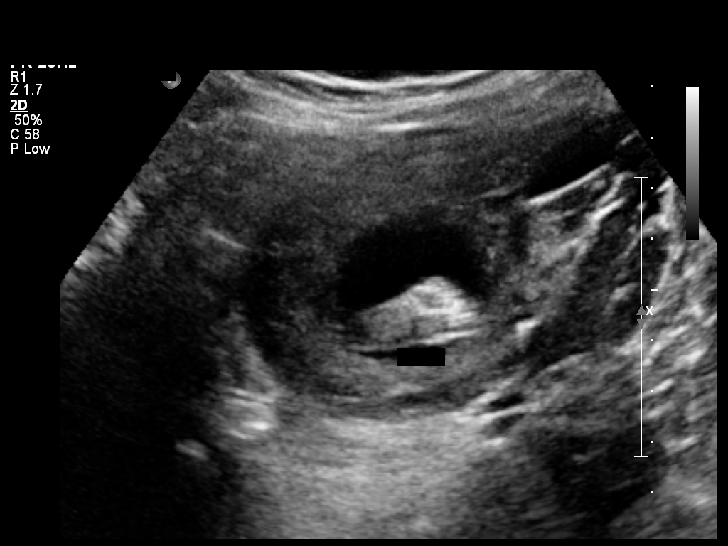
[im 80/114]
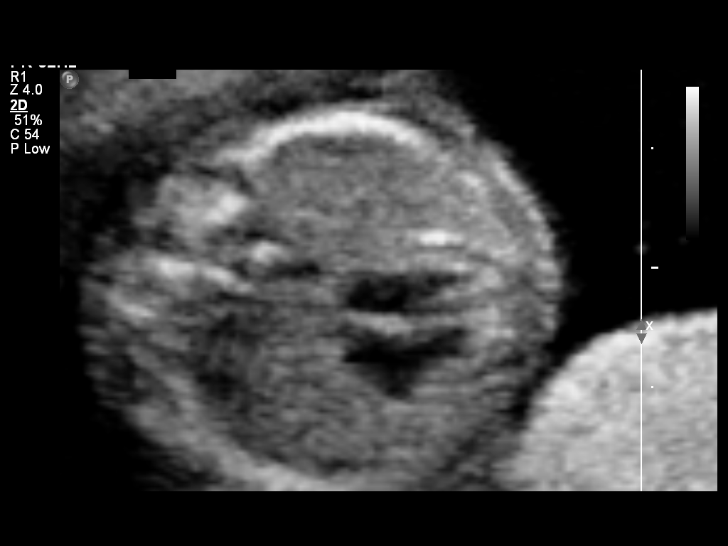
[im 93/114]
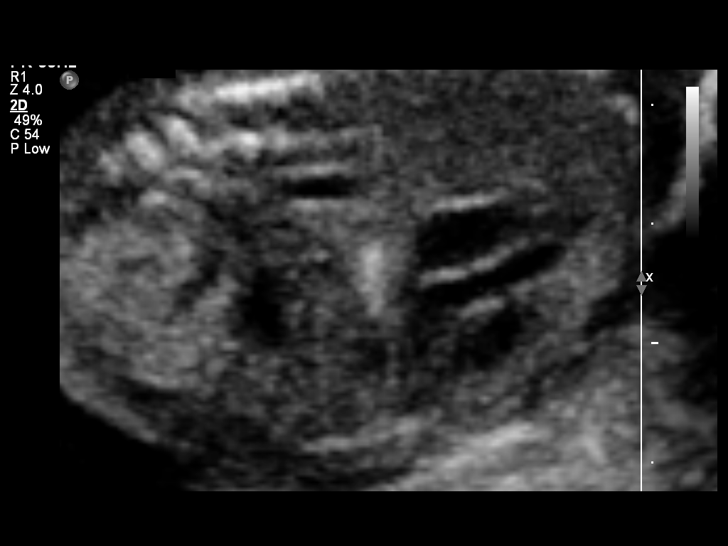
[im 101/114]
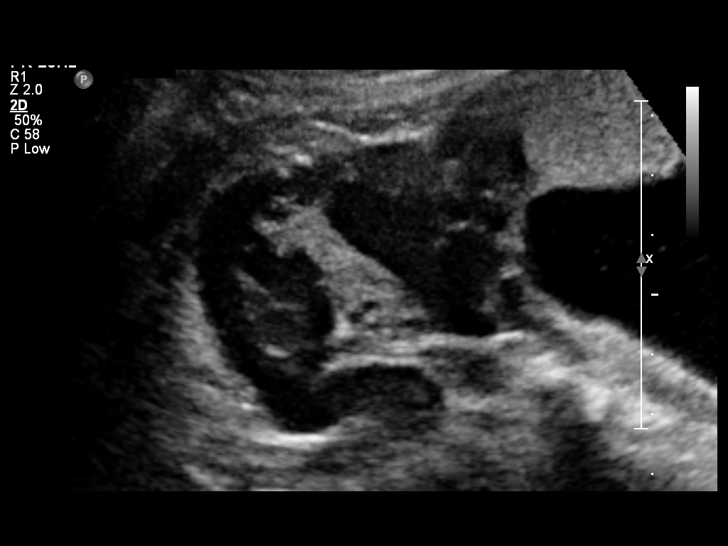
[im 109/114]
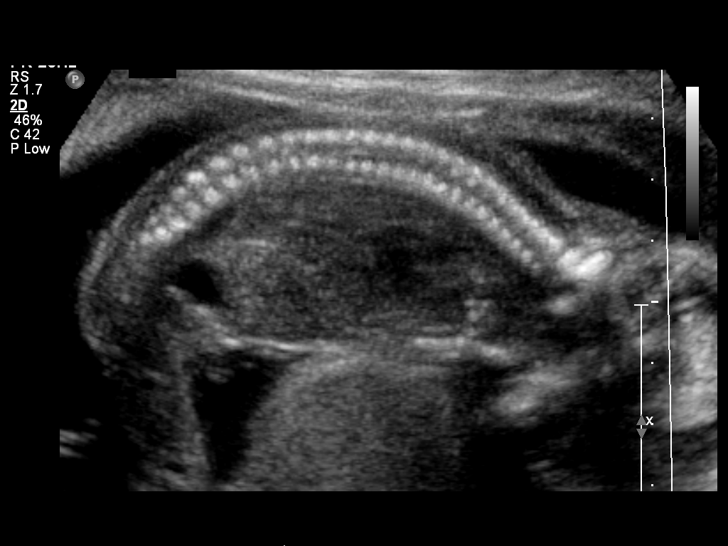

[12 of 28 positions shown; findings below may reference images not displayed]

OBSTETRICS REPORT
                      (Signed Final 05/11/2011 [DATE])

 Order#:         [id number]
Procedures

 US OB DETAIL + 14 WK                                  76811.0
Indications

 Detailed fetal anatomic survey
Fetal Evaluation

 Fetal Heart Rate:  142                         bpm
 Cardiac Activity:  Observed
 Presentation:      Cephalic
 Placenta:          Anterior, above cervical os
 P. Cord            Visualized
 Insertion:

 Amniotic Fluid
 AFI FV:      Subjectively within normal limits
                                             Larg Pckt:     4.5  cm
Biometry

 BPD:     40.4  mm    G. Age:   18w 2d                CI:        74.65   70 - 86
                                                      FL/HC:      17.2   15.8 -
                                                                         18
 HC:     148.4  mm    G. Age:   18w 0d       26  %    HC/AC:      1.26   1.07 -

 AC:     118.1  mm    G. Age:   17w 4d       23  %    FL/BPD:
 FL:      25.5  mm    G. Age:   17w 5d       24  %    FL/AC:      21.6   20 - 24
 HUM:     23.5  mm    G. Age:   17w 2d       23  %
 CER:     16.6  mm    G. Age:   16w 4d       11  %
 NFT:     2.72  mm

 Est. FW:     205  gm      0 lb 7 oz     34  %
Gestational Age

 LMP:           18w 2d       Date:   01/03/11                 EDD:   10/10/11
 U/S Today:     17w 6d                                        EDD:   10/13/11
 Best:          18w 2d    Det. By:   LMP  (01/03/11)          EDD:   10/10/11
Anatomy
 Cranium:           Appears normal      Aortic Arch:       Appears normal
 Fetal Cavum:       Appears normal      Ductal Arch:       Appears normal
 Ventricles:        Appears normal      Diaphragm:         Appears normal
 Choroid Plexus:    Appears normal      Stomach:           Appears normal
 Cerebellum:        Appears normal      Abdomen:           Appears normal
 Posterior Fossa:   Appears normal      Abdominal Wall:    Appears nml
                                                           (cord insert,
                                                           abd wall)
 Nuchal Fold:       Appears normal      Cord Vessels:      Appears normal
                    (neck, nuchal                          (3 vessel cord)
                    fold)
 Face:              Appears normal      Kidneys:           Appear normal
                    (lips/profile/orbit
                    s)
 Heart:             Appears normal      Bladder:           Appears normal
                    (4 chamber &
                    axis)
 RVOT:              Appears normal      Spine:             Appears normal
 LVOT:              Appears normal      Limbs:             Appears normal
                                                           (hands, ankles,
                                                           feet)

 Other:     Heels and 5th digit visualized. Male gender.
Cervix Uterus Adnexa

 Cervical Length:   4.3       cm

 Cervix:       Normal appearance by transabdominal scan.
 Left Ovary:   Within normal limits.
 Right Ovary:  Within normal limits.
Impression

 Single living IUP with assigned GA of 18w 2d. EGA by
 ultrasound is concordant with dating by LMP.
 No fetal anatomic abnormality is identified.
 Normal amniotic fluid volume and cervical length.

 questions or concerns.

## 2013-11-20 ENCOUNTER — Encounter (HOSPITAL_COMMUNITY): Payer: Self-pay | Admitting: *Deleted

## 2014-01-19 HISTORY — PX: UMBILICAL HERNIA REPAIR: SHX196

## 2014-04-10 ENCOUNTER — Emergency Department
Admission: EM | Admit: 2014-04-10 | Discharge: 2014-04-10 | Disposition: A | Discharge status: Home / Self Care | Payer: 59 | Source: Home / Self Care | Attending: Emergency Medicine | Admitting: Emergency Medicine

## 2014-04-10 ENCOUNTER — Encounter: Payer: Self-pay | Admitting: *Deleted

## 2014-04-10 DIAGNOSIS — N72 Inflammatory disease of cervix uteri: Secondary | ICD-10-CM | POA: Diagnosis not present

## 2014-04-10 DIAGNOSIS — N898 Other specified noninflammatory disorders of vagina: Secondary | ICD-10-CM | POA: Diagnosis not present

## 2014-04-10 DIAGNOSIS — G8929 Other chronic pain: Secondary | ICD-10-CM | POA: Diagnosis not present

## 2014-04-10 DIAGNOSIS — R1031 Right lower quadrant pain: Secondary | ICD-10-CM | POA: Diagnosis not present

## 2014-04-10 LAB — POCT URINALYSIS DIP (MANUAL ENTRY)
Bilirubin, UA: NEGATIVE
Blood, UA: NEGATIVE
Glucose, UA: NEGATIVE
Ketones, POC UA: NEGATIVE
Nitrite, UA: NEGATIVE
Protein Ur, POC: NEGATIVE
Spec Grav, UA: 1.02 (ref 1.005–1.03)
Urobilinogen, UA: 0.2 (ref 0–1)
pH, UA: 7 (ref 5–8)

## 2014-04-10 LAB — POCT URINE PREGNANCY: Preg Test, Ur: NEGATIVE

## 2014-04-10 MED ORDER — DOXYCYCLINE HYCLATE 100 MG PO CAPS: 100.0000 mg | ORAL_CAPSULE | Freq: Two times a day (BID) | ORAL | Status: DC

## 2014-04-10 MED ORDER — METRONIDAZOLE 500 MG PO TABS: 500.0000 mg | ORAL_TABLET | Freq: Two times a day (BID) | ORAL | Status: DC

## 2014-04-10 MED ORDER — CEFTRIAXONE SODIUM 250 MG IJ SOLR
500.0000 mg | Freq: Once | INTRAMUSCULAR | Status: AC
  Administered 2014-04-10: 500 mg via INTRAMUSCULAR

## 2014-04-10 NOTE — ED Provider Notes (Signed)
CSN: [id number]     Arrival date & time 04/10/14  1936 History   First MD Initiated Contact with Patient 04/10/14 1950     Chief Complaint  Patient presents with  . Abdominal Pain   [facility] 7[facility] PM HPI Here with fianc, who has symptoms of urethritis and epididymitis. Chief complaint: 2 weeks right lower quadrant abdominal pain, foul-smelling yet yellow vaginal discharge. Mild low back pain. Denies fever or chills. Some minimal nausea that's resolved. No vomiting or diarrhea. She states she is monogamous with fianc. Complains of inflamed gums without bleeding or discharge, but she tastes a foul smell in her mouth.  Has minimal suprapubic and right and left lower quadrant abdominal pain. Denies any other ENT or cardiorespiratory symptoms. She denies chance of pregnancy, as LMP 2 weeks ago. Denies dysuria or hematuria. Denies history of STD. She does not remember ever having STD testing. Past Medical History  Diagnosis Date  . Preterm labor   . Anemia   . Hx of thrombocytopenia     in pregnancy   Past Surgical History  Procedure Laterality Date  . Hernia repair      Inguinal 24 yo  . Laparoscopic tubal ligation  12/08/2011    Procedure: LAPAROSCOPIC TUBAL LIGATION;  Surgeon: Catalina AntiguaPeggy Constant, MD;  Location: [facility];  Service: Gynecology;  Laterality: Bilateral;   Family History  Problem Relation Age of Onset  . Cancer Maternal Grandmother     breast cancer  . Diabetes Maternal Grandfather   . Other Neg Hx   . Hypertension Mother   . Hyperlipidemia Mother    History  Substance Use Topics  . Smoking status: Never Smoker   . Smokeless tobacco: Never Used  . Alcohol Use: No   OB History    Gravida Para Term Preterm AB TAB SAB Ectopic Multiple Living   4 3 2 1 1  1   3      Review of Systems  All other systems reviewed and are negative.   Allergies  Review of patient's allergies indicates no known allergies.  Home Medications    Prior to Admission medications   Medication Sig Start Date End Date Taking? Authorizing Provider  doxycycline (VIBRAMYCIN) 100 MG capsule Take 1 capsule (100 mg total) by mouth 2 (two) times daily. 04/10/14   Lajean Manesavid Massey, MD  ibuprofen (ADVIL,MOTRIN) 600 MG tablet Take 1 tablet (600 mg total) by mouth every 6 (six) hours as needed for pain. 12/08/11   Peggy Constant, MD  metroNIDAZOLE (FLAGYL) 500 MG tablet Take 1 tablet (500 mg total) by mouth 2 (two) times daily. 04/10/14   Lajean Manesavid Massey, MD  Prenatal Vit-Fe Fumarate-FA (PRENATAL MULTIVITAMIN) 60-1 MG tablet Take 1 tablet by mouth daily.    Historical Provider, MD   BP 108/74 mmHg  Pulse 81  Temp(Src) 98.3 F (36.8 C) (Oral)  Resp 16  Ht 4\' 11"  (1.499 m)  Wt 135 lb (61.236 kg)  BMI 27.25 kg/m2  SpO2 98%  LMP 03/27/2014 Physical Exam  Constitutional: She is oriented to person, place, and time. She appears well-developed and well-nourished. No distress.  HENT:  Head: Normocephalic and atraumatic.  Nose: Nose normal.  Mouth/Throat: No oropharyngeal exudate.  Some inflamed gingiva diffusely, but no evidence of dental abscess. No tenderness to percussion over teeth. No other oral lesions. Remainder of HEENT exam normal  Eyes: Conjunctivae and EOM are normal. Pupils are equal, round, and reactive to light. Right eye exhibits no discharge. Left eye  exhibits no discharge. No scleral icterus.  Neck: Normal range of motion.  Cardiovascular: Normal rate, regular rhythm and normal heart sounds.   No murmur heard. Pulmonary/Chest: Effort normal and breath sounds normal.  Abdominal: Soft. Bowel sounds are normal. She exhibits no distension and no mass. There is no hepatosplenomegaly. There is no rebound, no guarding, no CVA tenderness, no tenderness at McBurney's point and negative Murphy's sign.  Mild right lower quadrant, suprapubic and left lower quadrant tenderness. No masses or guarding or rebound. She declined pelvic exam   Genitourinary:  She declined pelvic exam  Musculoskeletal: Normal range of motion. She exhibits no edema or tenderness.  Lymphadenopathy:    She has no cervical adenopathy.  Neurological: She is alert and oriented to person, place, and time. No cranial nerve deficit.  Skin: Skin is warm. No rash noted. She is not diaphoretic.  Psychiatric: She has a normal mood and affect.  Nursing note and vitals reviewed.   ED Course  Procedures (including critical care time) Labs Review Labs Reviewed  URINE CULTURE  WET PREP, GENITAL  GC/CHLAMYDIA PROBE AMP, URINE  HIV ANTIBODY (ROUTINE TESTING)  RPR  POCT URINALYSIS DIP (MANUAL ENTRY)  POCT URINE PREGNANCY   Results for orders placed or performed during the hospital encounter of 04/10/14  POCT urinalysis dipstick (new)  Result Value Ref Range   Color, UA yellow    Clarity, UA clear    Glucose, UA neg    Bilirubin, UA negative    Bilirubin, UA negative    Spec Grav, UA 1.020 1.005 - 1.03   Blood, UA negative    pH, UA 7.0 5 - 8   Protein Ur, POC negative    Urobilinogen, UA 0.2 0 - 1   Nitrite, UA Negative    Leukocytes, UA Trace   POCT urine pregnancy  Result Value Ref Range   Preg Test, Ur Negative      MDM   1. Abdominal pain, chronic, right lower quadrant   2. Vaginal discharge   3. Cervicitis   4. Gingivitis--for this, advised to follow-up with dentist.  By history and physical exam, she likely has cervicitis. Vaginal discharge. No evidence of acute abdomen clinically. She needs coverage for possible GC, chlamydia, trichomonas, and/or bacterial vaginosis. She refused pelvic exam today. Treatment options discussed, as well as risks, benefits, alternatives. Patient voiced understanding and agreement with the following plans: Rocephin 500 mg IM stat. Urine for GC and chlamydia tests. Self wet prep, send to reference lab. Blood tests for HIV and RPR Discharge Medication List as of 04/10/2014  8:56 PM    START taking  these medications   Details  doxycycline (VIBRAMYCIN) 100 MG capsule Take 1 capsule (100 mg total) by mouth 2 (two) times daily., Starting 04/10/2014, Until Discontinued, Normal    metroNIDAZOLE (FLAGYL) 500 MG tablet Take 1 tablet (500 mg total) by mouth 2 (two) times daily., Starting 04/10/2014, Until Discontinued, Normal       I urged her to establish with and follow up with a gynecologist within 7-10 days, and I explained risks of not doing so. Precautions discussed. Red flags discussed. Other symptomatic care discussed. Questions invited and answered. Patient voiced understanding and agreement.     Lajean Manes, MD 04/10/14 2135

## 2014-04-10 NOTE — ED Notes (Signed)
Pt c/o RLQ abd pain, foul smelling yellow vaginal d/c, back pain, and nausea x 2 wks. Denies fever.

## 2014-04-11 LAB — RPR

## 2014-04-11 LAB — GC/CHLAMYDIA PROBE AMP, URINE
Chlamydia, Swab/Urine, PCR: NEGATIVE
GC Probe Amp, Urine: NEGATIVE

## 2014-04-11 LAB — HIV ANTIBODY (ROUTINE TESTING W REFLEX): HIV 1&2 Ab, 4th Generation: NONREACTIVE

## 2014-04-11 LAB — WET PREP, GENITAL
Trich, Wet Prep: NONE SEEN
Yeast Wet Prep HPF POC: NONE SEEN

## 2014-04-12 ENCOUNTER — Telehealth: Payer: Self-pay | Admitting: Emergency Medicine

## 2014-04-12 LAB — URINE CULTURE
Colony Count: NO GROWTH
Organism ID, Bacteria: NO GROWTH

## 2014-04-24 ENCOUNTER — Ambulatory Visit (INDEPENDENT_AMBULATORY_CARE_PROVIDER_SITE_OTHER): Payer: 59

## 2014-04-24 ENCOUNTER — Encounter: Payer: Self-pay | Admitting: Physician Assistant

## 2014-04-24 ENCOUNTER — Ambulatory Visit (INDEPENDENT_AMBULATORY_CARE_PROVIDER_SITE_OTHER): Payer: 59 | Admitting: Physician Assistant

## 2014-04-24 VITALS — BP 116/81 | HR 90 | Ht 59.0 in | Wt 136.0 lb

## 2014-04-24 DIAGNOSIS — R10811 Right upper quadrant abdominal tenderness: Secondary | ICD-10-CM

## 2014-04-24 DIAGNOSIS — Z9889 Other specified postprocedural states: Secondary | ICD-10-CM

## 2014-04-24 DIAGNOSIS — R1033 Periumbilical pain: Secondary | ICD-10-CM | POA: Insufficient documentation

## 2014-04-24 DIAGNOSIS — R10813 Right lower quadrant abdominal tenderness: Secondary | ICD-10-CM

## 2014-04-24 DIAGNOSIS — Z8719 Personal history of other diseases of the digestive system: Secondary | ICD-10-CM | POA: Insufficient documentation

## 2014-04-24 DIAGNOSIS — R112 Nausea with vomiting, unspecified: Secondary | ICD-10-CM

## 2014-04-24 MED ORDER — TRAMADOL HCL 50 MG PO TABS: 50.0000 mg | ORAL_TABLET | Freq: Three times a day (TID) | ORAL | Status: DC | PRN

## 2014-04-24 MED ORDER — ONDANSETRON HCL 4 MG PO TABS: 4.0000 mg | ORAL_TABLET | Freq: Three times a day (TID) | ORAL | Status: DC | PRN

## 2014-04-25 ENCOUNTER — Telehealth: Payer: Self-pay | Admitting: Physician Assistant

## 2014-04-25 DIAGNOSIS — R10811 Right upper quadrant abdominal tenderness: Secondary | ICD-10-CM

## 2014-04-25 LAB — COMPLETE METABOLIC PANEL WITH GFR
ALT: 45 U/L — ABNORMAL HIGH (ref 0–35)
AST: 30 U/L (ref 0–37)
Albumin: 4.7 g/dL (ref 3.5–5.2)
Alkaline Phosphatase: 80 U/L (ref 39–117)
BUN: 10 mg/dL (ref 6–23)
CALCIUM: 9.6 mg/dL (ref 8.4–10.5)
CHLORIDE: 101 meq/L (ref 96–112)
CO2: 29 mEq/L (ref 19–32)
Creat: 0.5 mg/dL (ref 0.50–1.10)
GFR, Est Non African American: >89 mL/min
Glucose, Bld: 100 mg/dL — ABNORMAL HIGH (ref 70–99)
POTASSIUM: 4 meq/L (ref 3.5–5.3)
Sodium: 138 mEq/L (ref 135–145)
Total Bilirubin: 0.4 mg/dL (ref 0.2–1.2)
Total Protein: 7.7 g/dL (ref 6.0–8.3)

## 2014-04-25 LAB — CBC WITH DIFFERENTIAL/PLATELET
Basophils Absolute: 0 10*3/uL (ref 0.0–0.1)
Basophils Relative: 0 % (ref 0–1)
EOS PCT: 2 % (ref 0–5)
Eosinophils Absolute: 0.2 10*3/uL (ref 0.0–0.7)
HEMATOCRIT: 40 % (ref 36.0–46.0)
Hemoglobin: 13.6 g/dL (ref 12.0–15.0)
Lymphocytes Relative: 36 % (ref 12–46)
Lymphs Abs: 2.8 10*3/uL (ref 0.7–4.0)
MCH: 30 pg (ref 26.0–34.0)
MCHC: 34 g/dL (ref 30.0–36.0)
MCV: 88.3 fL (ref 78.0–100.0)
Monocytes Absolute: 0.7 10*3/uL (ref 0.1–1.0)
Monocytes Relative: 9 % (ref 3–12)
Neutro Abs: 4.2 10*3/uL (ref 1.7–7.7)
Neutrophils Relative %: 53 % (ref 43–77)
Platelets: 177 10*3/uL (ref 150–400)
RBC: 4.53 MIL/uL (ref 3.87–5.11)
RDW: 13.2 % (ref 11.5–15.5)
WBC: 7.9 10*3/uL (ref 4.0–10.5)

## 2014-04-25 NOTE — Telephone Encounter (Signed)
Patient just wanted to inform that her symptoms from office visit on 04/24/14 were unchanged.

## 2014-04-25 NOTE — Telephone Encounter (Signed)
If still having abdominal pain can order CT with contrast oral and IV abdominal and pelvis

## 2014-04-26 NOTE — Progress Notes (Signed)
   Subjective:    Patient ID: Michelle Macias, female    DOB: 1990-07-03, 24 y.o.   MRN: [id number]  HPI Pt is a 24 yo female who presents to the clinic to establish care and discuss right sided abdominal pain, nausea, vomiting and diarrhea for 2 days. She has had episodes before and not found anything wrong. She does have a hx of umbicical and inguinal hernia repair. She denies any fever, chills or body aches. [facility] and was negative. No urinary pain or symptoms.   .. Active Ambulatory Problems    Diagnosis Date Noted  . Pregnancy with history of pre-term labor 04/07/2011  . Supervision of high-risk pregnancy 04/13/2011  . Encounter for sterilization 12/08/2011  . Postoperative pain 12/11/2011  . Incisional infection 12/11/2011  . Hx of inguinal hernia repair 04/24/2014  . Hx of umbilical hernia repair 04/24/2014  . RUQ abdominal tenderness 04/24/2014  . Umbilical pain 04/24/2014  . Abdominal tenderness, RLQ (right lower quadrant) 04/24/2014   Resolved Ambulatory Problems    Diagnosis Date Noted  . No Resolved Ambulatory Problems   Past Medical History  Diagnosis Date  . Preterm labor   . Anemia   . Hx of thrombocytopenia   . H/O hernia repair    .Marland Kitchen. Family History  Problem Relation Age of Onset  . Cancer Maternal Grandmother     breast cancer  . Diabetes Maternal Grandfather   . Other Neg Hx   . Hypertension Mother   . Hyperlipidemia Mother    .Marland Kitchen. History   Social History  . Marital Status: Single    Spouse Name: N/A  . Number of Children: N/A  . Years of Education: N/A   Occupational History  . Not on file.   Social History Main Topics  . Smoking status: Never Smoker   . Smokeless tobacco: Never Used  . Alcohol Use: No  . Drug Use: No  . Sexual Activity: Not Currently     Comment: tubal   Other Topics Concern  . Not on file   Social History Narrative      Review of Systems  All other systems reviewed and are  negative.      Objective:   Physical Exam  Constitutional: She is oriented to person, place, and time. She appears well-developed and well-nourished.  HENT:  Head: Normocephalic and atraumatic.  Cardiovascular: Normal rate, regular rhythm and normal heart sounds.   Pulmonary/Chest: Effort normal and breath sounds normal. She has no wheezes.  Abdominal: Soft. Bowel sounds are normal. She exhibits no distension and no mass.  Tenderness over RUQ and around periumbical area. With deep palpation some guarding is noticed peri-umbicially.   Neurological: She is alert and oriented to person, place, and time.  Skin: Skin is dry.  Psychiatric: She has a normal mood and affect. Her behavior is normal.          Assessment & Plan:  Abdominal tenderness/nausea/vomiting/diarrhea- will get u/s today to rule out gallbladder and perhaps even show a new hernia. Cbc and cmp ordered. zofran given for nausea. Tramadol given for pain. Unclear etiology at this point. CT scan for similar symptoms back in 12/2013 reveal normal with some post-surgical changes. Perhaps this could be some scar tissue causing the pain for surgery. Pt is having bowel movement discussed if stops to let us know. Will continue to monitor.

## 2014-04-27 ENCOUNTER — Ambulatory Visit (HOSPITAL_BASED_OUTPATIENT_CLINIC_OR_DEPARTMENT_OTHER)
Admission: RE | Admit: 2014-04-27 | Discharge: 2014-04-27 | Disposition: A | Discharge status: Home / Self Care | Payer: 59 | Source: Ambulatory Visit | Attending: Physician Assistant | Admitting: Physician Assistant

## 2014-04-27 DIAGNOSIS — R112 Nausea with vomiting, unspecified: Secondary | ICD-10-CM | POA: Insufficient documentation

## 2014-04-27 DIAGNOSIS — R109 Unspecified abdominal pain: Secondary | ICD-10-CM | POA: Insufficient documentation

## 2014-04-27 DIAGNOSIS — N2889 Other specified disorders of kidney and ureter: Secondary | ICD-10-CM | POA: Diagnosis not present

## 2014-04-27 DIAGNOSIS — N8320 Unspecified ovarian cysts: Secondary | ICD-10-CM | POA: Insufficient documentation

## 2014-04-27 DIAGNOSIS — R197 Diarrhea, unspecified: Secondary | ICD-10-CM | POA: Insufficient documentation

## 2014-04-27 DIAGNOSIS — R10811 Right upper quadrant abdominal tenderness: Secondary | ICD-10-CM

## 2014-04-27 NOTE — Telephone Encounter (Signed)
Patient is not feeling any better. Ordered CT abdomen and pelvis with and without IV contrast, per note. Patient is aware to wait for a call.

## 2014-04-27 NOTE — Addendum Note (Signed)
Addended by: Chalmers CaterUTTLE, Elliana Bal H on: 04/27/2014 02:52 PM   Modules accepted: Orders

## 2014-04-27 NOTE — Telephone Encounter (Signed)
Changed order to without contrast

## 2014-04-27 NOTE — Telephone Encounter (Signed)
Authorization received, Pt scheduled for imaging.

## 2014-04-27 NOTE — Addendum Note (Signed)
Addended by: Chalmers CaterUTTLE, Keron Koffman H on: 04/27/2014 02:02 PM   Modules accepted: Orders

## 2014-04-30 ENCOUNTER — Encounter: Payer: Self-pay | Admitting: Physician Assistant

## 2014-04-30 ENCOUNTER — Other Ambulatory Visit: Payer: Self-pay | Admitting: Physician Assistant

## 2014-04-30 DIAGNOSIS — N2889 Other specified disorders of kidney and ureter: Secondary | ICD-10-CM | POA: Insufficient documentation

## 2014-04-30 DIAGNOSIS — K5900 Constipation, unspecified: Secondary | ICD-10-CM | POA: Insufficient documentation

## 2014-05-23 LAB — CBC AND DIFFERENTIAL
Hemoglobin: 13 g/dL (ref 12.0–16.0)
Platelets: 142 10*3/uL — AB (ref 150–399)
WBC: 5.1 10*3/mL

## 2014-05-23 LAB — PTH, INTACT: PTH Interp: 48

## 2014-05-23 LAB — HEPATIC FUNCTION PANEL
ALK PHOS: 82 U/L (ref 25–125)
ALT: 13 U/L (ref 7–35)
AST: 17 U/L (ref 13–35)

## 2014-05-23 LAB — BASIC METABOLIC PANEL
BUN: 8 mg/dL (ref 4–21)
CREATININE: 0.1 mg/dL — AB (ref 0.5–1.1)
Glucose: 86 mg/dL
POTASSIUM: 4 mmol/L (ref 3.4–5.3)
SODIUM: 138 mmol/L (ref 137–147)

## 2014-05-23 LAB — IRON: IRON: 90

## 2014-05-23 LAB — CALCITRIOL (1,25 DI-OH VIT D): Vit D, 1,25-Dihydroxy: 42.6

## 2014-05-23 LAB — PHOSPHORUS: Phosphorus: 4

## 2014-05-23 LAB — VITAMIN D 25 HYDROXY (VIT D DEFICIENCY, FRACTURES): Vit D, 25-Hydroxy: 10.1

## 2014-05-23 LAB — IRON AND TIBC: TIBC: 289

## 2014-05-23 LAB — FERRITIN: Ferritin: 78

## 2014-05-30 ENCOUNTER — Encounter: Payer: Self-pay | Admitting: Family Medicine

## 2014-05-30 DIAGNOSIS — E559 Vitamin D deficiency, unspecified: Secondary | ICD-10-CM | POA: Insufficient documentation

## 2014-06-05 ENCOUNTER — Encounter: Payer: Self-pay | Admitting: Physician Assistant

## 2015-01-20 HISTORY — PX: HERNIA REPAIR: SHX51

## 2015-01-26 ENCOUNTER — Emergency Department (HOSPITAL_COMMUNITY)
Admission: EM | Admit: 2015-01-26 | Discharge: 2015-01-26 | Disposition: A | Discharge status: Home / Self Care | Payer: Self-pay | Attending: Emergency Medicine | Admitting: Emergency Medicine

## 2015-01-26 ENCOUNTER — Encounter (HOSPITAL_COMMUNITY): Payer: Self-pay

## 2015-01-26 ENCOUNTER — Emergency Department (HOSPITAL_COMMUNITY): Payer: 59

## 2015-01-26 DIAGNOSIS — Z8719 Personal history of other diseases of the digestive system: Secondary | ICD-10-CM | POA: Insufficient documentation

## 2015-01-26 DIAGNOSIS — S3991XA Unspecified injury of abdomen, initial encounter: Secondary | ICD-10-CM | POA: Insufficient documentation

## 2015-01-26 DIAGNOSIS — Y9289 Other specified places as the place of occurrence of the external cause: Secondary | ICD-10-CM | POA: Insufficient documentation

## 2015-01-26 DIAGNOSIS — Y9389 Activity, other specified: Secondary | ICD-10-CM | POA: Insufficient documentation

## 2015-01-26 DIAGNOSIS — W1839XA Other fall on same level, initial encounter: Secondary | ICD-10-CM | POA: Insufficient documentation

## 2015-01-26 DIAGNOSIS — Z862 Personal history of diseases of the blood and blood-forming organs and certain disorders involving the immune mechanism: Secondary | ICD-10-CM | POA: Insufficient documentation

## 2015-01-26 DIAGNOSIS — Z3202 Encounter for pregnancy test, result negative: Secondary | ICD-10-CM | POA: Insufficient documentation

## 2015-01-26 DIAGNOSIS — Y998 Other external cause status: Secondary | ICD-10-CM | POA: Insufficient documentation

## 2015-01-26 DIAGNOSIS — Z8751 Personal history of pre-term labor: Secondary | ICD-10-CM | POA: Insufficient documentation

## 2015-01-26 LAB — COMPREHENSIVE METABOLIC PANEL
ALT: 28 U/L (ref 14–54)
AST: 26 U/L (ref 15–41)
Albumin: 3.8 g/dL (ref 3.5–5.0)
Alkaline Phosphatase: 78 U/L (ref 38–126)
Anion gap: 10 (ref 5–15)
BUN: 7 mg/dL (ref 6–20)
CHLORIDE: 103 mmol/L (ref 101–111)
CO2: 24 mmol/L (ref 22–32)
Calcium: 9.2 mg/dL (ref 8.9–10.3)
Creatinine, Ser: 0.52 mg/dL (ref 0.44–1.00)
GFR calc Af Amer: >60 mL/min (ref >60)
GFR calc non Af Amer: >60 mL/min (ref >60)
GLUCOSE: 83 mg/dL (ref 65–99)
Potassium: 3.7 mmol/L (ref 3.5–5.1)
SODIUM: 137 mmol/L (ref 135–145)
Total Bilirubin: 0.4 mg/dL (ref 0.3–1.2)
Total Protein: 6.8 g/dL (ref 6.5–8.1)

## 2015-01-26 LAB — CBC WITH DIFFERENTIAL/PLATELET
Basophils Absolute: 0 10*3/uL (ref 0.0–0.1)
Basophils Relative: 0 %
EOS ABS: 0.1 10*3/uL (ref 0.0–0.7)
Eosinophils Relative: 1 %
HCT: 38 % (ref 36.0–46.0)
HEMOGLOBIN: 12.6 g/dL (ref 12.0–15.0)
Lymphocytes Relative: 37 %
Lymphs Abs: 2.8 10*3/uL (ref 0.7–4.0)
MCH: 29.9 pg (ref 26.0–34.0)
MCHC: 33.2 g/dL (ref 30.0–36.0)
MCV: 90.3 fL (ref 78.0–100.0)
Monocytes Absolute: 0.8 10*3/uL (ref 0.1–1.0)
Monocytes Relative: 10 %
NEUTROS PCT: 52 %
Neutro Abs: 3.9 10*3/uL (ref 1.7–7.7)
PLATELETS: 148 10*3/uL — AB (ref 150–400)
RBC: 4.21 MIL/uL (ref 3.87–5.11)
RDW: 12.4 % (ref 11.5–15.5)
WBC: 7.6 10*3/uL (ref 4.0–10.5)

## 2015-01-26 LAB — LIPASE, BLOOD: Lipase: 21 U/L (ref 11–51)

## 2015-01-26 LAB — I-STAT BETA HCG BLOOD, ED (MC, WL, AP ONLY): I-stat hCG, quantitative: <5 m[IU]/mL (ref <5)

## 2015-01-26 MED ORDER — IOHEXOL 300 MG/ML  SOLN
100.0000 mL | Freq: Once | INTRAMUSCULAR | Status: AC | PRN
  Administered 2015-01-26: 100 mL via INTRAVENOUS

## 2015-01-26 MED ORDER — MORPHINE SULFATE (PF) 4 MG/ML IV SOLN
4.0000 mg | Freq: Once | INTRAVENOUS | Status: AC
  Administered 2015-01-26: 4 mg via INTRAVENOUS
  Filled 2015-01-26: qty 1

## 2015-01-26 MED ORDER — ONDANSETRON HCL 4 MG/2ML IJ SOLN
4.0000 mg | Freq: Once | INTRAMUSCULAR | Status: AC
  Administered 2015-01-26: 4 mg via INTRAVENOUS
  Filled 2015-01-26: qty 2

## 2015-01-26 NOTE — ED Notes (Signed)
Patient able to dress and ambulate independently 

## 2015-01-26 NOTE — ED Provider Notes (Signed)
CSN: [id number]     Arrival date & time 01/26/15  1446 History   First MD Initiated Contact with Patient 01/26/15 1451     Chief Complaint  Patient presents with  . Abdominal Injury     (Consider location/radiation/quality/duration/timing/severity/associated sxs/prior Treatment) The history is provided by the patient and medical records.     25 year old female with history of anemia presenting to the ED for abdominal pain. Patient states he was roughhousing at home with her son who is 75 years old. She states he was trying to jump into the floor, however he accidentally jumped onto her stomach. He weighs approximately 50 pounds. She states she has pain of her periumbilical region and right lower quadrant. Patient had revision of umbilical hernia repair in June. She states she did have some consultations afterwards that she had some internal bleeding and hematoma of her abdominal wall. She states she was briefly on blood thinners, however is not on them any longer. She denies any nausea or vomiting. She's not taking any medications prior to arrival. Patient has been well otherwise recently, no fever or chills.  Past Medical History  Diagnosis Date  . Preterm labor   . Anemia   . Hx of thrombocytopenia     in pregnancy  . H/O hernia repair     2 hernia repairs 1 umbilical & 1 groin   Past Surgical History  Procedure Laterality Date  . Laparoscopic tubal ligation  12/08/2011    Procedure: LAPAROSCOPIC TUBAL LIGATION;  Surgeon: Catalina Antigua, MD;  Location: [facility];  Service: Gynecology;  Laterality: Bilateral;  . Hernia repair      Inguinal 25 yo,, groin  . Umbilical hernia repair  2016    also had previous umbilical hernia repair   Family History  Problem Relation Age of Onset  . Cancer Maternal Grandmother     breast cancer  . Diabetes Maternal Grandfather   . Other Neg Hx   . Hypertension Mother   . Hyperlipidemia Mother    Social History  Substance Use Topics  . Smoking  status: Never Smoker   . Smokeless tobacco: Never Used  . Alcohol Use: No   OB History    Gravida Para Term Preterm AB TAB SAB Ectopic Multiple Living   4 3 2 1 1  1   3      Review of Systems  Gastrointestinal: Positive for abdominal pain.  All other systems reviewed and are negative.     Allergies  Review of patient's allergies indicates no known allergies.  Home Medications   Prior to Admission medications   Medication Sig Start Date End Date Taking? Authorizing Provider  ibuprofen (ADVIL,MOTRIN) 600 MG tablet Take 1 tablet (600 mg total) by mouth every 6 (six) hours as needed for pain. 12/08/11   Peggy Constant, MD  ondansetron (ZOFRAN) 4 MG tablet Take 1 tablet (4 mg total) by mouth every 8 (eight) hours as needed for nausea or vomiting. 04/24/14   Jade L Breeback, PA-C  traMADol (ULTRAM) 50 MG tablet Take 1 tablet (50 mg total) by mouth every 8 (eight) hours as needed. 04/24/14   Jade L Breeback, PA-C   BP 114/80 mmHg  Pulse 95  Temp(Src) 97.9 F (36.6 C) (Oral)  Resp 16  Ht 4\' 11"  (1.499 m)  Wt 63.458 kg  BMI 28.24 kg/m2  SpO2 98%  LMP 12/30/2014   Physical Exam  Constitutional: She is oriented to person, place, and time. She appears well-developed and well-nourished.  HENT:  Head: Normocephalic and atraumatic.  Mouth/Throat: Oropharynx is clear and moist.  Eyes: Conjunctivae and EOM are normal. Pupils are equal, round, and reactive to light.  Neck: Normal range of motion.  Cardiovascular: Normal rate, regular rhythm and normal heart sounds.   Pulmonary/Chest: Effort normal and breath sounds normal. No respiratory distress. She has no wheezes.  Abdominal: Soft. Bowel sounds are normal. There is tenderness in the periumbilical area.  Peri-umbilical laparoscopic incisions which appear to be well healed; localized TTP with voluntary guarding  Musculoskeletal: Normal range of motion.  Neurological: She is alert and oriented to person, place, and time.  Skin: Skin is  warm and dry.  Psychiatric: She has a normal mood and affect.  Nursing note and vitals reviewed.   ED Course  Procedures (including critical care time) Labs Review Labs Reviewed  CBC WITH DIFFERENTIAL/PLATELET - Abnormal; Notable for the following:    Platelets 148 (*)    All other components within normal limits  COMPREHENSIVE METABOLIC PANEL  LIPASE, BLOOD  I-STAT BETA HCG BLOOD, ED ([facility], [facility], [facility] ONLY)    Imaging Review Ct Abdomen Pelvis W Contrast  01/26/2015  CLINICAL DATA:  Right upper quadrant pain EXAM: CT ABDOMEN AND PELVIS WITH CONTRAST TECHNIQUE: Multidetector CT imaging of the abdomen and pelvis was performed using the standard protocol following bolus administration of intravenous contrast. CONTRAST:  100mL OMNIPAQUE IOHEXOL 300 MG/ML  SOLN COMPARISON:  None. 04/27/2014 FINDINGS: Lower chest:  No acute findings. Hepatobiliary: No mass new in when pes because a diluted J hemi sec es or other significant abnormality. Pancreas: No mass, inflammatory changes, or other significant abnormality. Spleen: Within normal limits in size and appearance. Adrenals/Urinary Tract: No masses identified. No evidence of hydronephrosis. Stomach/Bowel: No evidence of obstruction, inflammatory process, or abnormal fluid collections. The appendix is visualized and appears normal. Vascular/Lymphatic: No pathologically enlarged lymph nodes. No evidence of abdominal aortic aneurysm. Reproductive: Corpus luteal cysts noted within the left ovary. Otherwise normal physiologic appearance of the uterus and adnexal structures. Other: Previous ventral hernia repair with match, image number 43 of series 201. The hernia mesh appears intact. Musculoskeletal:  No suspicious bone lesions identified. IMPRESSION: 1. No acute findings identified within the abdomen or pelvis. Electronically Signed   By: Michelle Macias  Stroud M.D.   On: 01/26/2015 16:43   I have personally reviewed and evaluated these images and lab results as part of  my medical decision-making.   EKG Interpretation None      MDM   Final diagnoses:  Abdominal injury, initial encounter   25 year old female here with abdominal injury. She reports her 26-year-old son who is approximately 50 pounds jumped on her abdomen.  She reports surgery last June to revise her hernia repair and is concerned about that.  Patient has tenderness on exam with voluntary guarding.  Her abdomen does not appear distended.  Lab work reassuring.  CT scan without evidence of traumatic injuries.  Results were discussed with patient and fiance at bedside-- she admits that she has had fairy chronic pain since her initial hernia surgery in 2015, however she no longer follows with her surgeon because she was told nothing further could be done.  Patient stable for discharge.  FU with PCP.  Discussed plan with patient, he/she acknowledged understanding and agreed with plan of care.  Return precautions given for new or worsening symptoms.  Garlon HatchetLisa M Analyce Tavares, PA-C 01/26/15 [phone]

## 2015-01-26 NOTE — ED Notes (Signed)
PA at bedside updating patient.

## 2015-01-26 NOTE — Discharge Instructions (Signed)
Follow up with your primary care physician. °Return here for new concerns. °

## 2015-01-26 NOTE — ED Notes (Signed)
PA made aware of patient's pain and BP

## 2015-01-26 NOTE — ED Notes (Signed)
Onset 30 minutes PTA pt was playing with 50 lb son, son fell on abd c/o pain.  Pt had umbilical hernia repair several months ago.

## 2015-04-25 NOTE — ED Notes (Signed)
Medical screening examination/treatment/procedure(s) were performed by non-physician practitioner and as supervising physician I was immediately available for consultation/collaboration.   EKG Interpretation None        Rolland PorterMark Bhavya Eschete, MD 04/26/15 [phone]

## 2015-05-06 ENCOUNTER — Emergency Department (HOSPITAL_BASED_OUTPATIENT_CLINIC_OR_DEPARTMENT_OTHER)
Admission: EM | Admit: 2015-05-06 | Discharge: 2015-05-07 | Disposition: A | Discharge status: Home / Self Care | Payer: 59 | Attending: Emergency Medicine | Admitting: Emergency Medicine

## 2015-05-06 ENCOUNTER — Encounter (HOSPITAL_BASED_OUTPATIENT_CLINIC_OR_DEPARTMENT_OTHER): Payer: Self-pay

## 2015-05-06 ENCOUNTER — Ambulatory Visit (INDEPENDENT_AMBULATORY_CARE_PROVIDER_SITE_OTHER): Payer: 59 | Admitting: Physician Assistant

## 2015-05-06 ENCOUNTER — Other Ambulatory Visit: Payer: Self-pay | Admitting: Physician Assistant

## 2015-05-06 ENCOUNTER — Ambulatory Visit (HOSPITAL_BASED_OUTPATIENT_CLINIC_OR_DEPARTMENT_OTHER)
Admission: RE | Admit: 2015-05-06 | Discharge: 2015-05-06 | Disposition: A | Discharge status: Home / Self Care | Payer: 59 | Source: Ambulatory Visit | Attending: Physician Assistant | Admitting: Physician Assistant

## 2015-05-06 ENCOUNTER — Encounter: Payer: Self-pay | Admitting: Physician Assistant

## 2015-05-06 VITALS — BP 110/72 | HR 86 | Ht 59.0 in | Wt 136.0 lb

## 2015-05-06 DIAGNOSIS — R39859 Costovertebral (angle) tenderness, unspecified side: Secondary | ICD-10-CM

## 2015-05-06 DIAGNOSIS — T7840XA Allergy, unspecified, initial encounter: Secondary | ICD-10-CM | POA: Diagnosis present

## 2015-05-06 DIAGNOSIS — R198 Other specified symptoms and signs involving the digestive system and abdomen: Secondary | ICD-10-CM | POA: Diagnosis not present

## 2015-05-06 DIAGNOSIS — T782XXA Anaphylactic shock, unspecified, initial encounter: Secondary | ICD-10-CM | POA: Insufficient documentation

## 2015-05-06 DIAGNOSIS — R300 Dysuria: Secondary | ICD-10-CM

## 2015-05-06 DIAGNOSIS — R109 Unspecified abdominal pain: Secondary | ICD-10-CM | POA: Diagnosis not present

## 2015-05-06 DIAGNOSIS — R1030 Lower abdominal pain, unspecified: Secondary | ICD-10-CM | POA: Insufficient documentation

## 2015-05-06 DIAGNOSIS — M549 Dorsalgia, unspecified: Secondary | ICD-10-CM

## 2015-05-06 DIAGNOSIS — R3915 Urgency of urination: Secondary | ICD-10-CM

## 2015-05-06 DIAGNOSIS — R10A2 Flank pain, left side: Secondary | ICD-10-CM

## 2015-05-06 LAB — COMPLETE METABOLIC PANEL WITH GFR
ALBUMIN: 4.2 g/dL (ref 3.6–5.1)
ALT: 15 U/L (ref 6–29)
AST: 15 U/L (ref 10–30)
Alkaline Phosphatase: 81 U/L (ref 33–115)
BUN: 8 mg/dL (ref 7–25)
CO2: 27 mmol/L (ref 20–31)
Calcium: 9 mg/dL (ref 8.6–10.2)
Chloride: 102 mmol/L (ref 98–110)
Creat: 0.6 mg/dL (ref 0.50–1.10)
GFR, Est African American: >89 mL/min (ref >=60)
GFR, Est Non African American: >89 mL/min (ref >=60)
GLUCOSE: 143 mg/dL — AB (ref 65–99)
POTASSIUM: 3.8 mmol/L (ref 3.5–5.3)
SODIUM: 139 mmol/L (ref 135–146)
TOTAL PROTEIN: 6.8 g/dL (ref 6.1–8.1)
Total Bilirubin: 0.4 mg/dL (ref 0.2–1.2)

## 2015-05-06 LAB — CBC WITH DIFFERENTIAL/PLATELET
Basophils Absolute: 0 cells/uL (ref 0–200)
Basophils Relative: 0 %
Eosinophils Absolute: 73 cells/uL (ref 15–500)
Eosinophils Relative: 1 %
HCT: 35.4 % (ref 35.0–45.0)
Hemoglobin: 11.7 g/dL (ref 11.7–15.5)
Lymphocytes Relative: 35 %
Lymphs Abs: 2555 cells/uL (ref 850–3900)
MCH: 29 pg (ref 27.0–33.0)
MCHC: 33.1 g/dL (ref 32.0–36.0)
MCV: 87.6 fL (ref 80.0–100.0)
MONOS PCT: 6 %
MPV: 13.1 fL — ABNORMAL HIGH (ref 7.5–12.5)
Monocytes Absolute: 438 cells/uL (ref 200–950)
Neutro Abs: 4234 cells/uL (ref 1500–7800)
Neutrophils Relative %: 58 %
Platelets: 172 10*3/uL (ref 140–400)
RBC: 4.04 MIL/uL (ref 3.80–5.10)
RDW: 13.2 % (ref 11.0–15.0)
WBC: 7.3 10*3/uL (ref 3.8–10.8)

## 2015-05-06 LAB — POCT URINALYSIS DIPSTICK
BILIRUBIN UA: NEGATIVE
Blood, UA: NEGATIVE
Glucose, UA: NEGATIVE
KETONES UA: NEGATIVE
Leukocytes, UA: NEGATIVE
Nitrite, UA: NEGATIVE
PH UA: 5.5
Protein, UA: NEGATIVE
Spec Grav, UA: >=1.03
Urobilinogen, UA: 0.2

## 2015-05-06 MED ORDER — EPINEPHRINE 0.3 MG/0.3ML IJ SOAJ
INTRAMUSCULAR | Status: AC
  Filled 2015-05-06: qty 0.3

## 2015-05-06 MED ORDER — ONDANSETRON 8 MG PO TBDP: 8.0000 mg | ORAL_TABLET | Freq: Three times a day (TID) | ORAL | Status: DC | PRN

## 2015-05-06 MED ORDER — DIPHENHYDRAMINE HCL 50 MG/ML IJ SOLN
INTRAMUSCULAR | Status: AC
  Administered 2015-05-06: 50 mg via INTRAVENOUS
  Filled 2015-05-06: qty 1

## 2015-05-06 MED ORDER — METHYLPREDNISOLONE SODIUM SUCC 125 MG IJ SOLR
125.0000 mg | Freq: Once | INTRAMUSCULAR | Status: AC
Start: 2015-05-06 — End: 2015-05-06
  Administered 2015-05-06: 125 mg via INTRAVENOUS

## 2015-05-06 MED ORDER — EPINEPHRINE 0.3 MG/0.3ML IJ SOAJ
0.3000 mg | Freq: Once | INTRAMUSCULAR | Status: AC
  Administered 2015-05-06: 0.3 mg via INTRAMUSCULAR

## 2015-05-06 MED ORDER — METHYLPREDNISOLONE SODIUM SUCC 125 MG IJ SOLR
INTRAMUSCULAR | Status: AC
  Filled 2015-05-06: qty 2

## 2015-05-06 MED ORDER — ONDANSETRON HCL 4 MG PO TABS: 4.0000 mg | ORAL_TABLET | Freq: Three times a day (TID) | ORAL | Status: DC | PRN

## 2015-05-06 MED ORDER — DIPHENHYDRAMINE HCL 50 MG/ML IJ SOLN
50.0000 mg | Freq: Once | INTRAMUSCULAR | Status: AC
  Administered 2015-05-06: 50 mg via INTRAVENOUS

## 2015-05-06 MED ORDER — RACEPINEPHRINE HCL 2.25 % IN NEBU
0.5000 mL | INHALATION_SOLUTION | Freq: Once | RESPIRATORY_TRACT | Status: AC
  Administered 2015-05-06: 0.5 mL via RESPIRATORY_TRACT

## 2015-05-06 MED ORDER — IOPAMIDOL (ISOVUE-300) INJECTION 61%
100.0000 mL | Freq: Once | INTRAVENOUS | Status: AC | PRN
  Administered 2015-05-06: 100 mL via INTRAVENOUS

## 2015-05-06 NOTE — ED Notes (Signed)
Pt reports feeling much better. Breath sounds improved, clear bilateral. Oxygen sat 99% on RA. Pt and family at bedside informed of need to monitor for a while after receiving epi. Both verbalized understanding. Dr. Karma GanjaLinker at bedside.

## 2015-05-06 NOTE — Progress Notes (Signed)
   Subjective:    Patient ID: Michelle Macias, female    DOB: 1990-02-13, 25 y.o.   MRN: [id number]  HPI Pt is female who presents to the clinic with 7 days of lower abdominal pain, left flank pain, increased urination and frequency. Drinking more water. No relief. Pt was about to go ER due to pain last night. At times pain is 8/10. She is nauseated. No fever, chills, body aches. She still has appendix.  Review of Systems    see HPI Objective:   Physical Exam  Constitutional: She appears well-developed and well-nourished.  HENT:  Head: Normocephalic and atraumatic.  Eyes: Conjunctivae are normal.  Cardiovascular: Normal rate, regular rhythm and normal heart sounds.   Pulmonary/Chest: Effort normal and breath sounds normal.  Left CVA tenderness.   Abdominal: Soft. Bowel sounds are normal.  Tenderness over lower quadrant with guarding over right and left lower quadrant.           Assessment & Plan:  Left CVA tenderness/lower abdominal guarding/nausea/urinary urgency/frequency- no blood, leuks, nitrates on exam. .. Results for orders placed or performed in visit on 05/06/15  POCT urinalysis dipstick  Result Value Ref Range   Color, UA yellow    Clarity, UA slightly cloudy    Glucose, UA neg    Bilirubin, UA neg    Ketones, UA neg    Spec Grav, UA >=1.030    Blood, UA neg    pH, UA 5.5    Protein, UA neg    Urobilinogen, UA 0.2    Nitrite, UA neg    Leukocytes, UA Negative Negative     Will culture. No signs of infection today.  Due to physical exam will rule out diverticulitis/appendcitis/pyelonephritis/nephrolithasis/ovarian cyst CT STAT ordered.  Stat CBC and CMP ordered today as well.  Will send zofran for nausea as needed.

## 2015-05-06 NOTE — ED Notes (Signed)
Pt had an outpatient CT with contrast and right after procedure, she started having throat itching, coughing and feeling like she is going to vomit; pt is clearing throat a lot in triage.

## 2015-05-06 NOTE — ED Notes (Signed)
Pt ambulatory to bathroom with assistance. No other needs identified at this time.

## 2015-05-06 NOTE — ED Provider Notes (Signed)
CSN: [id number]     Arrival date & time 05/06/15  2045 History  By signing my name below, I, Michelle Macias, attest that this documentation has been prepared under the direction and in the presence of No att. providers found. Electronically Signed: Linus GalasMaharshi Macias, ED Scribe. 05/09/2015. 8:56 PM.   Chief Complaint  Patient presents with  . Allergic Reaction   The history is provided by the spouse. No language interpreter was used.   HPI Comments: A LEVEL 5 CAVEAT PERTAINS DUE TO URGENT NEED FOR INTERVENTION Michelle NobleYesenia Gallego is a 25 y.o. female who presents to the Emergency Department with a no pertinent PMHx complaining of a possible allergic reaction today, PTA. Pt was getting an outpatient CTA when she suddenly became nauseated. She began coughing, with associated throat itching. Pts relative states she has had IV contrast in the past with no difficulty. Pt was vomiting in the ED. Pt denies fevers, chills any other symptoms at this time.   Past Medical History  Diagnosis Date  . Preterm labor   . Anemia   . Hx of thrombocytopenia     in pregnancy  . H/O hernia repair     2 hernia repairs 1 umbilical & 1 groin   Past Surgical History  Procedure Laterality Date  . Laparoscopic tubal ligation  12/08/2011    Procedure: LAPAROSCOPIC TUBAL LIGATION;  Surgeon: Michelle AntiguaPeggy Constant, MD;  Location: [facility];  Service: Gynecology;  Laterality: Bilateral;  . Hernia repair      Inguinal 25 yo,, groin  . Umbilical hernia repair  2016    also had previous umbilical hernia repair   Family History  Problem Relation Age of Onset  . Cancer Maternal Grandmother     breast cancer  . Diabetes Maternal Grandfather   . Other Neg Hx   . Hypertension Mother   . Hyperlipidemia Mother    Social History  Substance Use Topics  . Smoking status: Never Smoker   . Smokeless tobacco: Never Used  . Alcohol Use: No   OB History    Gravida Para Term Preterm AB TAB SAB Ectopic Multiple Living   4 3 2 1 1  1   3       Review of Systems  Constitutional: Negative for fever and chills.  Respiratory: Positive for cough.   Gastrointestinal: Positive for nausea and vomiting.  All other systems reviewed and are negative. UNABLE TO OBTAIN ROS DUE TO LEVEL 5 CAVEAT- ros above entered in error by scribe- i am not able to delete ti Allergies  Contrast media  Home Medications   Prior to Admission medications   Medication Sig Start Date End Date Taking? Authorizing Provider  diphenhydrAMINE (BENADRYL) 25 MG tablet Take 2 tablets (50 mg total) by mouth every 6 (six) hours. Take 1-2 tablets every 6 hours x 2 days, then space out to an as needed basis 05/07/15   Jerelyn ScottMartha Linker, MD  EPINEPHrine 0.3 mg/0.3 mL IJ SOAJ injection Inject 0.3 mLs (0.3 mg total) into the muscle once. 05/07/15   Jerelyn ScottMartha Linker, MD  nitrofurantoin, macrocrystal-monohydrate, (MACROBID) 100 MG capsule Take 1 capsule (100 mg total) by mouth 2 (two) times daily. For 7 days. 05/07/15   Michelle L Breeback, PA-C  ondansetron (ZOFRAN) 4 MG tablet Take 1 tablet (4 mg total) by mouth every 8 (eight) hours as needed for nausea or vomiting. 05/06/15   Michelle L Breeback, PA-C  ondansetron (ZOFRAN-ODT) 8 MG disintegrating tablet Take 1 tablet (8 mg total) by mouth every  8 (eight) hours as needed for nausea. 05/06/15   Michelle L Breeback, PA-C  predniSONE (STERAPRED UNI-PAK 21 TAB) 10 MG (21) TBPK tablet Take 1 tablet (10 mg total) by mouth daily. Take 6 tabs by mouth daily  for 2 days, then 5 tabs for 2 days, then 4 tabs for 2 days, then 3 tabs for 2 days, 2 tabs for 2 days, then 1 tab by mouth daily for 2 days 05/07/15   Jerelyn Scott, MD   BP 102/67 mmHg  Pulse 90  Temp(Src) 98.1 F (36.7 C) (Axillary)  Resp 13  Ht  (1.499 m)  Wt 136 lb (61.689 kg)  BMI 27.45 kg/m2  SpO2 99%  LMP 04/19/2015 (Exact Date)  Vitals reviewed Physical Exam  Physical Examination: General appearance - alert, ill appearing, and in distress Mental status - alert, oriented Eyes -  no conjunctival injection no scleral icterus Mouth - mucous membranes moist, pharynx normal without lesions, some OP swelling, palate symmetric Neck - supple, no significant adenopathy Chest - clear to auscultation, no wheezes, rales or rhonchi, symmetric air entry, inspiratory stridor, tachypneic Heart - normal rate, regular rhythm, normal S1, S2, no murmurs, rubs, clicks or gallops Abdomen - soft, nontender, nondistended, no masses or organomegaly Neurological - alert, oriented Extremities - peripheral pulses normal, no pedal edema, no clubbing or cyanosis Skin - normal coloration and turgor, no rashes  ED Course  Procedures   DIAGNOSTIC STUDIES: Oxygen Saturation is 100% on room air, normal by my interpretation.    COORDINATION OF CARE: 8:51 PM Will give breathing treatment and epinephrine. Discussed treatment plan with pt at bedside and pt agreed to plan.  Labs Review Labs Reviewed - No data to display  Imaging Review No results found. I have personally reviewed and evaluated these images and lab results as part of my medical decision-making.   EKG Interpretation None      MDM   Final diagnoses:  Anaphylaxis, initial encounter    Pt presenting with difficulty breathing and vomiting after IV contrast injection.  She was seen immediately upon arrival.  Pt place on monitor, IV access obtained, pt given epinephrine and had good improvement in her symptoms.  She was also given benadryl IV and solumedrol.  She was observed x 4 hours and continued to have no recurrence of symptoms.  Pt discharged with steroid rx, benadryl, rx for epipen.      CRITICAL CARE Performed by: No att. providers found Total critical care time: 60 minutes Critical care time was exclusive of separately billable procedures and treating other patients. Critical care was necessary to treat or prevent imminent or life-threatening deterioration. Critical care was time spent personally by me on the  following activities: development of treatment plan with patient and/or surrogate as well as nursing, discussions with consultants, evaluation of patient's response to treatment, examination of patient, obtaining history from patient or surrogate, ordering and performing treatments and interventions, ordering and review of laboratory studies, ordering and review of radiographic studies, pulse oximetry and re-evaluation of patient's condition.  I personally performed the services described in this documentation, which was scribed in my presence. The recorded information has been reviewed and is accurate.     Jerelyn Scott, MD 05/09/15 2130

## 2015-05-07 ENCOUNTER — Other Ambulatory Visit: Payer: Self-pay | Admitting: Physician Assistant

## 2015-05-07 LAB — LIPASE: Lipase: 13 U/L (ref 7–60)

## 2015-05-07 MED ORDER — EPINEPHRINE 0.3 MG/0.3ML IJ SOAJ: 0.3000 mg | Freq: Once | INTRAMUSCULAR | Status: DC

## 2015-05-07 MED ORDER — DIPHENHYDRAMINE HCL 25 MG PO TABS: 50.0000 mg | ORAL_TABLET | Freq: Four times a day (QID) | ORAL | Status: DC

## 2015-05-07 MED ORDER — PREDNISONE 10 MG (21) PO TBPK: 10.0000 mg | ORAL_TABLET | Freq: Every day | ORAL | Status: DC

## 2015-05-07 MED ORDER — NITROFURANTOIN MONOHYD MACRO 100 MG PO CAPS: 100.0000 mg | ORAL_CAPSULE | Freq: Two times a day (BID) | ORAL | Status: DC

## 2015-05-07 NOTE — Discharge Instructions (Signed)
Return to the ED with any concerns including difficulty breathing, lip or tongue swelling, vomiting and not able to keep down liquds, or any other alarming symptoms

## 2015-05-09 LAB — URINE CULTURE: Colony Count: 80000

## 2015-06-25 ENCOUNTER — Ambulatory Visit (INDEPENDENT_AMBULATORY_CARE_PROVIDER_SITE_OTHER): Payer: 59 | Admitting: Physician Assistant

## 2015-06-25 ENCOUNTER — Encounter: Payer: Self-pay | Admitting: Physician Assistant

## 2015-06-25 VITALS — BP 106/68 | HR 81 | Ht 59.0 in | Wt 138.0 lb

## 2015-06-25 DIAGNOSIS — R1011 Right upper quadrant pain: Secondary | ICD-10-CM | POA: Insufficient documentation

## 2015-06-25 DIAGNOSIS — R142 Eructation: Secondary | ICD-10-CM | POA: Diagnosis not present

## 2015-06-25 DIAGNOSIS — R1010 Upper abdominal pain, unspecified: Secondary | ICD-10-CM | POA: Diagnosis not present

## 2015-06-25 DIAGNOSIS — N926 Irregular menstruation, unspecified: Secondary | ICD-10-CM

## 2015-06-25 DIAGNOSIS — R112 Nausea with vomiting, unspecified: Secondary | ICD-10-CM

## 2015-06-25 DIAGNOSIS — K219 Gastro-esophageal reflux disease without esophagitis: Secondary | ICD-10-CM

## 2015-06-25 DIAGNOSIS — R110 Nausea: Secondary | ICD-10-CM

## 2015-06-25 LAB — CBC WITH DIFFERENTIAL/PLATELET
BASOS ABS: 0 {cells}/uL (ref 0–200)
Basophils Relative: 0 %
EOS PCT: 1 %
Eosinophils Absolute: 57 cells/uL (ref 15–500)
HCT: 37.5 % (ref 35.0–45.0)
Hemoglobin: 12.5 g/dL (ref 11.7–15.5)
LYMPHS ABS: 2451 {cells}/uL (ref 850–3900)
LYMPHS PCT: 43 %
MCH: 29.3 pg (ref 27.0–33.0)
MCHC: 33.3 g/dL (ref 32.0–36.0)
MCV: 88 fL (ref 80.0–100.0)
MONOS PCT: 11 %
Monocytes Absolute: 627 cells/uL (ref 200–950)
NEUTROS PCT: 45 %
Neutro Abs: 2565 cells/uL (ref 1500–7800)
PLATELETS: 137 10*3/uL — AB (ref 140–400)
RBC: 4.26 MIL/uL (ref 3.80–5.10)
RDW: 13.5 % (ref 11.0–15.0)
WBC: 5.7 10*3/uL (ref 3.8–10.8)

## 2015-06-25 LAB — COMPLETE METABOLIC PANEL WITH GFR
ALT: 16 U/L (ref 6–29)
AST: 17 U/L (ref 10–30)
Albumin: 4.2 g/dL (ref 3.6–5.1)
Alkaline Phosphatase: 77 U/L (ref 33–115)
BILIRUBIN TOTAL: 0.3 mg/dL (ref 0.2–1.2)
BUN: 10 mg/dL (ref 7–25)
CHLORIDE: 103 mmol/L (ref 98–110)
CO2: 26 mmol/L (ref 20–31)
Calcium: 9.3 mg/dL (ref 8.6–10.2)
Creat: 0.53 mg/dL (ref 0.50–1.10)
GLUCOSE: 75 mg/dL (ref 65–99)
POTASSIUM: 4.2 mmol/L (ref 3.5–5.3)
SODIUM: 138 mmol/L (ref 135–146)
TOTAL PROTEIN: 6.6 g/dL (ref 6.1–8.1)

## 2015-06-25 LAB — LIPASE: Lipase: 9 U/L (ref 7–60)

## 2015-06-25 LAB — POCT URINE PREGNANCY: PREG TEST UR: NEGATIVE

## 2015-06-25 NOTE — Progress Notes (Addendum)
   Subjective:    Patient ID: Michelle NobleYesenia Macias, female    DOB: 09/07/1990, 25 y.o.   MRN: [id number]  HPI  Pt is a 25 yo female who presents to the clinic with missed period and faint line on home pregnancy. She is 6 days late for period. She had BTL 4 years ago in November. She has vomited twice but been extremely nauseated. Her upper abdomen has a dull ache. She complains of worsening acid reflux and burping. She took one bite of tomato yesterday and vomited. She feels exactly like she did when she was pregnant. She has 3 children at home. She denies any fever, chills, diarrhea, constipation, dysuria, flank pain, melena, or hematochezia.    Review of Systems  All other systems reviewed and are negative.      Objective:   Physical Exam  Constitutional: She is oriented to person, place, and time. She appears well-developed and well-nourished.  HENT:  Head: Normocephalic and atraumatic.  Cardiovascular: Normal rate, regular rhythm and normal heart sounds.   Pulmonary/Chest: Effort normal and breath sounds normal. She has no wheezes.  No CVA tenderness.   Abdominal: Soft. Bowel sounds are normal. She exhibits no distension and no mass. There is no tenderness. There is no rebound and no guarding.  Neurological: She is alert and oriented to person, place, and time.  Skin: Skin is dry.  Psychiatric: She has a normal mood and affect. Her behavior is normal.          Assessment & Plan:  Upper abdominal pain/burping/nausea/vomiting/acid reflux- UPT negative in office. Sent down for serum HCG. Will also check cbc, cmp, lipase. Will adjust treatment plan after labs. Certainly could be some gastritis or gallbladder dysfunction. Need to rule out pregnancy first.

## 2015-06-26 ENCOUNTER — Other Ambulatory Visit: Payer: Self-pay | Admitting: Physician Assistant

## 2015-06-26 LAB — HCG, QUANTITATIVE, PREGNANCY

## 2015-06-26 MED ORDER — OMEPRAZOLE 40 MG PO CPDR: 40.0000 mg | DELAYED_RELEASE_CAPSULE | Freq: Every day | ORAL | Status: DC

## 2015-07-05 ENCOUNTER — Ambulatory Visit: Payer: 59 | Admitting: Physician Assistant

## 2015-10-07 ENCOUNTER — Ambulatory Visit (INDEPENDENT_AMBULATORY_CARE_PROVIDER_SITE_OTHER): Payer: BLUE CROSS/BLUE SHIELD

## 2015-10-07 ENCOUNTER — Encounter: Payer: Self-pay | Admitting: Physician Assistant

## 2015-10-07 ENCOUNTER — Ambulatory Visit (INDEPENDENT_AMBULATORY_CARE_PROVIDER_SITE_OTHER): Payer: Self-pay | Admitting: Physician Assistant

## 2015-10-07 VITALS — BP 109/73 | HR 75 | Ht 59.0 in | Wt 138.0 lb

## 2015-10-07 DIAGNOSIS — R0602 Shortness of breath: Secondary | ICD-10-CM

## 2015-10-07 DIAGNOSIS — R05 Cough: Secondary | ICD-10-CM | POA: Diagnosis not present

## 2015-10-07 DIAGNOSIS — R059 Cough, unspecified: Secondary | ICD-10-CM

## 2015-10-07 DIAGNOSIS — R0789 Other chest pain: Secondary | ICD-10-CM

## 2015-10-07 MED ORDER — PREDNISONE 20 MG PO TABS: ORAL_TABLET | ORAL | 0 refills | Status: DC

## 2015-10-07 NOTE — Patient Instructions (Signed)

## 2015-10-07 NOTE — Progress Notes (Signed)
   Subjective:    Patient ID: Michelle Macias, female    DOB: 06-11-1990, 25 y.o.   MRN: [id number]  HPI  Pt is a 25 yo female who presents to the clinic with SOB, left to center chest pain, and cough. Symptoms have been going on for 1-2 weeks. She denies any upper respiratory symptoms. Hx of GERD and not taking PPI but denies any reflux symptoms. Pain is constant and no correlation to food. Denies any asthma in hx or wheezing. Walking and exertion causes tightness to worsen. Her husband said she gasped for air 3-4 times last night and snores.     Review of Systems    see HPI.  Objective:   Physical Exam  Constitutional: She is oriented to person, place, and time. She appears well-developed and well-nourished.  HENT:  Head: Normocephalic and atraumatic.  Right Ear: External ear normal.  Left Ear: External ear normal.  Nose: Nose normal.  Mouth/Throat: Oropharynx is clear and moist. No oropharyngeal exudate.  Eyes: Conjunctivae are normal.  Neck: Normal range of motion. Neck supple. No thyromegaly present.  Cardiovascular: Normal rate, regular rhythm and normal heart sounds.   Pulmonary/Chest: Effort normal and breath sounds normal. She has no wheezes.  Tenderness over sternum and to the left of sternum.   Lymphadenopathy:    She has no cervical adenopathy.  Neurological: She is alert and oriented to person, place, and time.  Skin:  No extremity edema.   Psychiatric: She has a normal mood and affect. Her behavior is normal.          Assessment & Plan:  Atypical chest pain/cough/SOB-  EKG with normal sinus rhythm at 77, short PR interval, no acute ST elevation or depression.  Will get CXR. No acute findings.  Peak flows in green.  Unclear etiology DDX costochondritis vs GERD. Pt declines reflux symptoms. Treated with prednisone no signs of any bacterial infection. Follow up if symptoms not improving in next 3 days. Reassurance given that work up is negative so far. Discussed  red flags to go to ED.

## 2015-10-08 ENCOUNTER — Encounter: Payer: Self-pay | Admitting: Physician Assistant

## 2015-10-08 DIAGNOSIS — R05 Cough: Secondary | ICD-10-CM | POA: Insufficient documentation

## 2015-10-08 DIAGNOSIS — R0789 Other chest pain: Secondary | ICD-10-CM | POA: Insufficient documentation

## 2015-10-08 DIAGNOSIS — R059 Cough, unspecified: Secondary | ICD-10-CM | POA: Insufficient documentation

## 2015-10-08 DIAGNOSIS — R0602 Shortness of breath: Secondary | ICD-10-CM | POA: Insufficient documentation

## 2016-01-02 ENCOUNTER — Ambulatory Visit (INDEPENDENT_AMBULATORY_CARE_PROVIDER_SITE_OTHER): Payer: BLUE CROSS/BLUE SHIELD

## 2016-01-02 ENCOUNTER — Encounter: Payer: Self-pay | Admitting: Physician Assistant

## 2016-01-02 ENCOUNTER — Other Ambulatory Visit: Payer: Self-pay | Admitting: Physician Assistant

## 2016-01-02 ENCOUNTER — Ambulatory Visit (INDEPENDENT_AMBULATORY_CARE_PROVIDER_SITE_OTHER): Payer: BLUE CROSS/BLUE SHIELD | Admitting: Physician Assistant

## 2016-01-02 VITALS — BP 122/82 | HR 90 | Temp 98.4°F | Wt 140.0 lb

## 2016-01-02 DIAGNOSIS — N200 Calculus of kidney: Secondary | ICD-10-CM | POA: Diagnosis not present

## 2016-01-02 DIAGNOSIS — Z8719 Personal history of other diseases of the digestive system: Secondary | ICD-10-CM | POA: Diagnosis not present

## 2016-01-02 DIAGNOSIS — Z9889 Other specified postprocedural states: Secondary | ICD-10-CM

## 2016-01-02 DIAGNOSIS — R1084 Generalized abdominal pain: Secondary | ICD-10-CM | POA: Diagnosis not present

## 2016-01-02 LAB — CBC WITH DIFFERENTIAL/PLATELET
BASOS ABS: 0 {cells}/uL (ref 0–200)
BASOS PCT: 0 %
EOS ABS: 90 {cells}/uL (ref 15–500)
Eosinophils Relative: 1 %
HEMATOCRIT: 38.2 % (ref 35.0–45.0)
Hemoglobin: 12.8 g/dL (ref 11.7–15.5)
LYMPHS PCT: 22 %
Lymphs Abs: 1980 cells/uL (ref 850–3900)
MCH: 29.4 pg (ref 27.0–33.0)
MCHC: 33.5 g/dL (ref 32.0–36.0)
MCV: 87.6 fL (ref 80.0–100.0)
MONO ABS: 810 {cells}/uL (ref 200–950)
MPV: 13.4 fL — AB (ref 7.5–12.5)
Monocytes Relative: 9 %
NEUTROS PCT: 68 %
Neutro Abs: 6120 cells/uL (ref 1500–7800)
Platelets: 162 10*3/uL (ref 140–400)
RBC: 4.36 MIL/uL (ref 3.80–5.10)
RDW: 13.2 % (ref 11.0–15.0)
WBC: 9 10*3/uL (ref 3.8–10.8)

## 2016-01-02 LAB — LIPASE: Lipase: 17 U/L (ref 7–60)

## 2016-01-02 LAB — COMPREHENSIVE METABOLIC PANEL
ALT: 12 U/L (ref 6–29)
AST: 20 U/L (ref 10–30)
Albumin: 4.3 g/dL (ref 3.6–5.1)
Alkaline Phosphatase: 81 U/L (ref 33–115)
BUN: 7 mg/dL (ref 7–25)
CALCIUM: 9.2 mg/dL (ref 8.6–10.2)
CHLORIDE: 101 mmol/L (ref 98–110)
CO2: 28 mmol/L (ref 20–31)
Creat: 0.54 mg/dL (ref 0.50–1.10)
GLUCOSE: 81 mg/dL (ref 65–99)
POTASSIUM: 4 mmol/L (ref 3.5–5.3)
Sodium: 138 mmol/L (ref 135–146)
Total Bilirubin: 0.5 mg/dL (ref 0.2–1.2)
Total Protein: 7.2 g/dL (ref 6.1–8.1)

## 2016-01-02 LAB — AMYLASE: Amylase: 54 U/L (ref 0–105)

## 2016-01-02 MED ORDER — ONDANSETRON 4 MG PO TBDP: 4.0000 mg | ORAL_TABLET | Freq: Four times a day (QID) | ORAL | 0 refills | Status: DC | PRN

## 2016-01-02 NOTE — Progress Notes (Signed)
Michelle NobleYesenia Macias is a 25 y.o. female who presents to [facility] today for nausea/vomiting/diarrhea  Symptoms began two weeks ago. This happens daily and occurs every time she eats. States she is able to drink water. Endorses fevers, chills, malaise, myalgias, headache, lightheadedness, cramping and right lower abdominal pain. Denies hematemesis, hematochezia, or vaginal symptoms. Headache is occipital and it sometimes "feels numb and weird." Endorses photosensitivity and nausea with her headaches. She is taking Excedrin tension headache and this provides some relief. She has never seen a neurologist or been formally diagnosed with migraines. On Tuesday states she was washing dishes "saw flashing lights," called for her husband and he lowered her to the ground. She denies chest pain, palpitations, or dyspnea during this fainting spell.  Denies recent travel. Denies sick contacts. PSHx is significant for 2 ventral hernia repairs, one of which she states was strangulated in May 2015. No other abdominal surgeries. LMP approx. 3 weeks ago and she has had a tubal ligation.   She has been evaluated for similar symptoms this year and had negative CT abd pelvis on 05/06/15 and 01/26/15.  Of note, she had wisdom tooth extraction yesterday and has some mild cheek swelling. She denies taking any narcotic pain medication today.  Past Medical History:  Diagnosis Date  . Anemia   . H/O hernia repair    2 hernia repairs 1 umbilical & 1 groin  . Hx of thrombocytopenia    in pregnancy  . Hypertension    managed with lifestyle modification  . Preterm labor   . Tension headache    Past Surgical History:  Procedure Laterality Date  . HERNIA REPAIR     Inguinal 25 yo,, groin  . LAPAROSCOPIC TUBAL LIGATION  12/08/2011   Procedure: LAPAROSCOPIC TUBAL LIGATION;  Surgeon: Catalina AntiguaPeggy Constant, MD;   Location: [facility];  Service: Gynecology;  Laterality: Bilateral;  . UMBILICAL HERNIA REPAIR  2016   also had previous umbilical hernia repair   Social History  Substance Use Topics  . Smoking status: Never Smoker  . Smokeless tobacco: Never Used  . Alcohol use No   family history includes Cancer in her maternal grandmother; Diabetes in her maternal grandfather; Hyperlipidemia in her mother; Hypertension in her mother.  ROS: negative except as noted in the HPI  Medications: Current Outpatient Prescriptions  Medication Sig Dispense Refill  . EPINEPHrine 0.3 mg/0.3 mL IJ SOAJ injection Inject 0.3 mLs (0.3 mg total) into the muscle once. 1 Device 2  . ondansetron (ZOFRAN-ODT) 4 MG disintegrating tablet Take 1 tablet (4 mg total) by mouth every 6 (six) hours as needed for nausea or vomiting. 16 tablet 0   No current facility-administered medications for this visit.    Allergies  Allergen Reactions  . Contrast Media [Iodinated Diagnostic Agents] Anaphylaxis    Started today 05/06/15    Health Maintenance Health Maintenance  Topic Date Due  . PAP SMEAR  04/14/2010  . INFLUENZA VACCINE  08/20/2015  . TETANUS/TDAP  06/28/2021  . HIV Screening  Completed     Exam:  BP 122/82   Pulse 90   Temp 98.4 F (36.9 C) (Oral)   Wt 140 lb (63.5 kg)   BMI 28.28 kg/m  Physical Exam  Constitutional: She appears ill. No distress.  HENT:  Head: Normocephalic and atraumatic.  Mouth/Throat: Mucous membranes are pale and not dry.  Mild cheek swelling and limited TMJ ROM due to oral surgery yesterday  Eyes: Conjunctivae, EOM and lids  are normal.  Neck: Full passive range of motion without pain and phonation normal. Neck supple.  Cardiovascular: Normal rate, S1 normal and S2 normal.  An irregular rhythm present.  Extrasystoles are present.  Pulmonary/Chest: Effort normal and breath sounds normal.  Abdominal: Soft. Normal appearance. She exhibits no ascites and no mass. There is generalized  tenderness and tenderness in the right lower quadrant. There is guarding. There is no rigidity, no rebound and negative Murphy's sign.    Neurological: She is alert.  Skin: Skin is warm and dry. No rash noted.  No tenting  Psychiatric: She has a normal mood and affect. Her speech is normal and behavior is normal.   I personally reviewed the CT reports from 01/26/15 and 05/06/15   Assessment and Plan: 25 y.o. female with history of ventral hernia repair presenting with n/v/d and generalized abdominal pain for 2 weeks. She is afebrile without an acute abdomen today. Given that this is a recurrent issue for her and the history of negative imaging, I have concern that this may be a functional issue. Pending results of CT today, will consider referral to GI for further work-up.  1. Generalized abdominal pain -Zofran 8mg  ODT given in clinic today - CT Abdomen Pelvis Wo Contrast - Urinalysis, Routine w reflex microscopic - CBC with Differential/Platelet - Comprehensive metabolic panel - Lipase - Amylase - ondansetron (ZOFRAN-ODT) 4 MG disintegrating tablet; Take 1 tablet (4 mg total) by mouth every 6 (six) hours as needed for nausea or vomiting.  Dispense: 16 tablet; Refill: 0  Patient education and anticipatory guidance given Patient agrees with treatment plan Follow-up prn if symptoms worsen or do not improve  Levonne Hubertharley E. Cummings PA-C

## 2016-01-03 LAB — URINALYSIS, ROUTINE W REFLEX MICROSCOPIC
Bilirubin Urine: NEGATIVE
Glucose, UA: NEGATIVE
Hgb urine dipstick: NEGATIVE
Ketones, ur: NEGATIVE
LEUKOCYTES UA: NEGATIVE
NITRITE: NEGATIVE
PROTEIN: NEGATIVE
Specific Gravity, Urine: 1.019 (ref 1.001–1.035)
pH: 6.5 (ref 5.0–8.0)

## 2016-01-03 NOTE — Progress Notes (Signed)
UA, CMP, CBC, Amylase, Lipase all within normal limits. CT negative except for tiny bilateral non-obstructing renal calculi. Informed patient of her results Placed referral to GI for further workup  Levonne Hubertharley E. Cummings PA-C

## 2016-09-07 ENCOUNTER — Encounter: Payer: Self-pay | Admitting: Physician Assistant

## 2016-09-07 ENCOUNTER — Ambulatory Visit (INDEPENDENT_AMBULATORY_CARE_PROVIDER_SITE_OTHER): Payer: Self-pay | Admitting: Physician Assistant

## 2016-09-07 VITALS — BP 117/80 | HR 77 | Temp 98.2°F | Wt 151.0 lb

## 2016-09-07 DIAGNOSIS — M25552 Pain in left hip: Secondary | ICD-10-CM

## 2016-09-07 DIAGNOSIS — N898 Other specified noninflammatory disorders of vagina: Secondary | ICD-10-CM

## 2016-09-07 DIAGNOSIS — M25551 Pain in right hip: Secondary | ICD-10-CM

## 2016-09-07 DIAGNOSIS — R51 Headache: Secondary | ICD-10-CM

## 2016-09-07 DIAGNOSIS — R519 Headache, unspecified: Secondary | ICD-10-CM

## 2016-09-07 DIAGNOSIS — R350 Frequency of micturition: Secondary | ICD-10-CM

## 2016-09-07 LAB — POCT URINALYSIS DIPSTICK
Bilirubin, UA: NEGATIVE
Glucose, UA: NEGATIVE
Ketones, UA: NEGATIVE
LEUKOCYTES UA: NEGATIVE
NITRITE UA: NEGATIVE
PH UA: 7 (ref 5.0–8.0)
PROTEIN UA: NEGATIVE
RBC UA: NEGATIVE
Spec Grav, UA: 1.02 (ref 1.010–1.025)
Urobilinogen, UA: 0.2 E.U./dL

## 2016-09-07 MED ORDER — TRAMADOL HCL 50 MG PO TABS: 50.0000 mg | ORAL_TABLET | Freq: Three times a day (TID) | ORAL | 0 refills | Status: DC | PRN

## 2016-09-07 NOTE — Progress Notes (Signed)
Subjective:    Patient ID: Michelle Macias, female    DOB: 1990-10-20, 26 y.o.   MRN: [id number]  HPI  Pt is a 26 yo female who presents to the clinic with bilateral hip pain and lower pelvic pain that start Thursday. She reports feeling like she is "tearing open inside". She also has a headache that is persisent and feels like a balloon about to burst. She was seen in the past for pelvic pain but she reports that is due to a tubal reversal she had done. She is concerned she has UTI. She denies any dysuria though. She is urinating more frequently. She had low grade fever last night. Denies and vaginal discharge. Friday she did have some brown discharge for one day. Next period is due on 26th. No diarrhea or vomiting. No upper abdominal pain. She does have a concern that she could be pregnant.   .. Active Ambulatory Problems    Diagnosis Date Noted  . Pregnancy with history of pre-term labor 04/07/2011  . Supervision of high-risk pregnancy 04/13/2011  . Encounter for sterilization 12/08/2011  . Postoperative pain 12/11/2011  . Incisional infection 12/11/2011  . Hx of inguinal hernia repair 04/24/2014  . Hx of umbilical hernia repair 04/24/2014  . RUQ abdominal tenderness 04/24/2014  . Umbilical pain 04/24/2014  . Abdominal tenderness, RLQ (right lower quadrant) 04/24/2014  . Constipation 04/30/2014  . 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 Vitamin D deficiency 05/30/2014  . Upper abdominal pain 06/25/2015  . Atypical chest pain 10/08/2015  . Shortness of breath 10/08/2015  . Cough 10/08/2015  . Generalized abdominal pain 01/02/2016   Resolved Ambulatory Problems    Diagnosis Date Noted  . No Resolved Ambulatory Problems   Past Medical History:  Diagnosis Date  . Anemia   . H/O hernia repair   . Hx of thrombocytopenia   . Hypertension   . Preterm labor   . Tension headache        Review of Systems See HPI.     Objective:   Physical Exam  Constitutional: She  is oriented to person, place, and time. She appears well-nourished.  HENT:  Head: Normocephalic and atraumatic.  Cardiovascular: Normal rate, regular rhythm and normal heart sounds.   Pulmonary/Chest: Effort normal and breath sounds normal.  No CVA tenderness.   Abdominal: Soft. Bowel sounds are normal. She exhibits no distension and no mass. There is no tenderness. There is no rebound and no guarding.  Musculoskeletal:  Bilateral pain to palpation over greater trochanter. Pain with external and internal ROM, bilaterally.  Negative straight leg raise.  Negative for any tenderness over lumbar spine.   Neurological: She is alert and oriented to person, place, and time.  Psychiatric: She has a normal mood and affect. Her behavior is normal.          Assessment & Plan:  Marland KitchenMarland KitchenAvalon was seen today for urinary frequency.  Diagnoses and all orders for this visit:  Headache on top of head -     CBC with Differential/Platelet -     BASIC METABOLIC PANEL WITH GFR  Urine frequency -     POCT Urinalysis Dipstick -     Urine Culture -     hCG, serum, qualitative -     CBC with Differential/Platelet -     BASIC METABOLIC PANEL WITH GFR  Bilateral hip pain -     CBC with Differential/Platelet -     BASIC METABOLIC PANEL WITH GFR  Discharge  from the vagina -     hCG, serum, qualitative -     CBC with Differential/Platelet -     BASIC METABOLIC PANEL WITH GFR  Other orders -     traMADol (ULTRAM) 50 MG tablet; Take 1 tablet (50 mg total) by mouth every 8 (eight) hours as needed.  UA dipstick was negative. Discussed not likely UTI. We culture.   Avoid NSAIDs if could be pregnant.  Tramadol given for pain.  Will get labs.  Consider icing painful areas.

## 2016-09-07 NOTE — Progress Notes (Signed)
Please culture.

## 2016-09-08 ENCOUNTER — Telehealth: Payer: Self-pay | Admitting: Physician Assistant

## 2016-09-08 DIAGNOSIS — R109 Unspecified abdominal pain: Secondary | ICD-10-CM

## 2016-09-08 LAB — CBC WITH DIFFERENTIAL/PLATELET
BASOS PCT: 0 %
Basophils Absolute: 0 cells/uL (ref 0–200)
EOS PCT: 2 %
Eosinophils Absolute: 130 cells/uL (ref 15–500)
HCT: 39.3 % (ref 35.0–45.0)
Hemoglobin: 13 g/dL (ref 11.7–15.5)
Lymphocytes Relative: 40 %
Lymphs Abs: 2600 cells/uL (ref 850–3900)
MCH: 29.7 pg (ref 27.0–33.0)
MCHC: 33.1 g/dL (ref 32.0–36.0)
MCV: 89.9 fL (ref 80.0–100.0)
MPV: 13.3 fL — AB (ref 7.5–12.5)
Monocytes Absolute: 585 cells/uL (ref 200–950)
Monocytes Relative: 9 %
NEUTROS ABS: 3185 {cells}/uL (ref 1500–7800)
Neutrophils Relative %: 49 %
PLATELETS: 173 10*3/uL (ref 140–400)
RBC: 4.37 MIL/uL (ref 3.80–5.10)
RDW: 13.1 % (ref 11.0–15.0)
WBC: 6.5 10*3/uL (ref 3.8–10.8)

## 2016-09-08 LAB — BASIC METABOLIC PANEL WITH GFR
BUN: 6 mg/dL — ABNORMAL LOW (ref 7–25)
CHLORIDE: 103 mmol/L (ref 98–110)
CO2: 28 mmol/L (ref 20–32)
CREATININE: 0.53 mg/dL (ref 0.50–1.10)
Calcium: 9.4 mg/dL (ref 8.6–10.2)
GFR, Est African American: >89 mL/min (ref >=60)
GFR, Est Non African American: >89 mL/min (ref >=60)
Glucose, Bld: 81 mg/dL (ref 65–99)
POTASSIUM: 4.2 mmol/L (ref 3.5–5.3)
SODIUM: 139 mmol/L (ref 135–146)

## 2016-09-08 LAB — HCG, SERUM, QUALITATIVE: PREG SERUM: NEGATIVE

## 2016-09-08 LAB — URINE CULTURE: ORGANISM ID, BACTERIA: NO GROWTH

## 2016-09-08 NOTE — Telephone Encounter (Signed)
Orders placed. Left VM advising Pt. Callback provided for any questions.

## 2016-09-08 NOTE — Telephone Encounter (Signed)
Pt called clinic today for results, they are not resulted yet. Advised we would contact her once they are. Pt then stated her right side pain is worse and "feels like her ovary." Will route to PCP.

## 2016-09-08 NOTE — Telephone Encounter (Signed)
Ok to order pelvic/transvaginal u/s to look for ovarian cyst

## 2016-09-09 ENCOUNTER — Other Ambulatory Visit: Payer: Self-pay | Admitting: Physician Assistant

## 2016-09-09 DIAGNOSIS — R109 Unspecified abdominal pain: Secondary | ICD-10-CM

## 2016-09-11 ENCOUNTER — Ambulatory Visit (INDEPENDENT_AMBULATORY_CARE_PROVIDER_SITE_OTHER): Payer: Self-pay

## 2016-09-11 DIAGNOSIS — R350 Frequency of micturition: Secondary | ICD-10-CM

## 2016-09-11 DIAGNOSIS — R1031 Right lower quadrant pain: Secondary | ICD-10-CM

## 2016-09-11 NOTE — Progress Notes (Signed)
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  At this point safe to take anti-inflammatory ibuprofen 800mg  up to three times a day. Ice area that hurts.   Has pain improved?

## 2016-09-29 ENCOUNTER — Encounter: Payer: Self-pay | Admitting: Physician Assistant

## 2016-09-29 ENCOUNTER — Ambulatory Visit (INDEPENDENT_AMBULATORY_CARE_PROVIDER_SITE_OTHER): Payer: Self-pay | Admitting: Physician Assistant

## 2016-09-29 VITALS — BP 115/71 | HR 63 | Temp 98.4°F | Wt 153.0 lb

## 2016-09-29 DIAGNOSIS — N926 Irregular menstruation, unspecified: Secondary | ICD-10-CM

## 2016-09-29 DIAGNOSIS — R1115 Cyclical vomiting syndrome unrelated to migraine: Secondary | ICD-10-CM

## 2016-09-29 DIAGNOSIS — G43A0 Cyclical vomiting, not intractable: Secondary | ICD-10-CM

## 2016-09-29 DIAGNOSIS — R1031 Right lower quadrant pain: Secondary | ICD-10-CM

## 2016-09-29 DIAGNOSIS — R197 Diarrhea, unspecified: Secondary | ICD-10-CM

## 2016-09-29 LAB — CBC WITH DIFFERENTIAL/PLATELET
BASOS ABS: 33 {cells}/uL (ref 0–200)
Basophils Relative: 0.5 %
Eosinophils Absolute: 40 cells/uL (ref 15–500)
Eosinophils Relative: 0.6 %
HEMATOCRIT: 39.1 % (ref 35.0–45.0)
HEMOGLOBIN: 13.3 g/dL (ref 11.7–15.5)
LYMPHS ABS: 2158 {cells}/uL (ref 850–3900)
MCH: 29.8 pg (ref 27.0–33.0)
MCHC: 34 g/dL (ref 32.0–36.0)
MCV: 87.7 fL (ref 80.0–100.0)
MONOS PCT: 7.4 %
MPV: 13.7 fL — AB (ref 7.5–12.5)
NEUTROS ABS: 3881 {cells}/uL (ref 1500–7800)
Neutrophils Relative %: 58.8 %
Platelets: 171 10*3/uL (ref 140–400)
RBC: 4.46 10*6/uL (ref 3.80–5.10)
RDW: 12 % (ref 11.0–15.0)
Total Lymphocyte: 32.7 %
WBC mixed population: 488 cells/uL (ref 200–950)
WBC: 6.6 10*3/uL (ref 3.8–10.8)

## 2016-09-29 LAB — HCG, SERUM, QUALITATIVE: PREG SERUM: NEGATIVE

## 2016-09-29 MED ORDER — ONDANSETRON HCL 4 MG PO TABS: 4.0000 mg | ORAL_TABLET | Freq: Three times a day (TID) | ORAL | 0 refills | Status: DC | PRN

## 2016-09-29 NOTE — Patient Instructions (Signed)
Viral Gastroenteritis, Adult Viral gastroenteritis is also known as the stomach flu. This condition is caused by certain germs (viruses). These germs can be passed from person to person very easily (are very contagious). This condition can cause sudden watery poop (diarrhea), fever, and throwing up (vomiting). Having watery poop and throwing up can make you feel weak and cause you to get dehydrated. Dehydration can make you tired and thirsty, make you have a dry mouth, and make it so you pee (urinate) less often. Older adults and people with other diseases or a weak defense system (immune system) are at higher risk for dehydration. It is important to replace the fluids that you lose from having watery poop and throwing up. Follow these instructions at home: Follow instructions from your doctor about how to care for yourself at home. Eating and drinking  Follow these instructions as told by your doctor:  Take an oral rehydration solution (ORS). This is a drink that is sold at pharmacies and stores.  Drink clear fluids in small amounts as you are able, such as: ? Water. ? Ice chips. ? Diluted fruit juice. ? Low-calorie sports drinks.  Eat bland, easy-to-digest foods in small amounts as you are able, such as: ? Bananas. ? Applesauce. ? Rice. ? Low-fat (lean) meats. ? Toast. ? Crackers.  Avoid fluids that have a lot of sugar or caffeine in them.  Avoid alcohol.  Avoid spicy or fatty foods.  General instructions  Drink enough fluid to keep your pee (urine) clear or pale yellow.  Wash your hands often. If you cannot use soap and water, use hand sanitizer.  Make sure that all people in your home wash their hands well and often.  Rest at home while you get better.  Take over-the-counter and prescription medicines only as told by your doctor.  Watch your condition for any changes.  Take a warm bath to help with any burning or pain from having watery poop.  Keep all follow-up  visits as told by your doctor. This is important. Contact a doctor if:  You cannot keep fluids down.  Your symptoms get worse.  You have new symptoms.  You feel light-headed or dizzy.  You have muscle cramps. Get help right away if:  You have chest pain.  You feel very weak or you pass out (faint).  You see blood in your throw-up.  Your throw-up looks like coffee grounds.  You have bloody or black poop (stools) or poop that look like tar.  You have a very bad headache, a stiff neck, or both.  You have a rash.  You have very bad pain, cramping, or bloating in your belly (abdomen).  You have trouble breathing.  You are breathing very quickly.  Your heart is beating very quickly.  Your skin feels cold and clammy.  You feel confused.  You have pain when you pee.  You have signs of dehydration, such as: ? Dark pee, hardly any pee, or no pee. ? Cracked lips. ? Dry mouth. ? Sunken eyes. ? Sleepiness. ? Weakness. This information is not intended to replace advice given to you by your health care provider. Make sure you discuss any questions you have with your health care provider. Document Released: 06/24/2007 Document Revised: 07/26/2015 Document Reviewed: 09/11/2014 Elsevier Interactive Patient Education  2017 [employer] Bland Diet A bland diet consists of foods that do not have a lot of fat or fiber. Foods without fat or fiber are easier for the body to   digest. They are also less likely to irritate your mouth, throat, stomach, and other parts of your gastrointestinal tract. A bland diet is sometimes called a BRAT diet. What is my plan? Your health care provider or dietitian may recommend specific changes to your diet to prevent and treat your symptoms, such as:  Eating small meals often.  Cooking food until it is soft enough to chew easily.  Chewing your food well.  Drinking fluids slowly.  Not eating foods that are very spicy, sour, or fatty.  Not  eating citrus fruits, such as oranges and grapefruit.  What do I need to know about this diet?  Eat a variety of foods from the bland diet food list.  Do not follow a bland diet longer than you have to.  Ask your health care provider whether you should take vitamins. What foods can I eat? Grains  Hot cereals, such as cream of wheat. Bread, crackers, or tortillas made from refined white flour. Rice. Vegetables Canned or cooked vegetables. Mashed or boiled potatoes. Fruits Bananas. Applesauce. Other types of cooked or canned fruit with the skin and seeds removed, such as canned peaches or pears. Meats and Other Protein Sources Scrambled eggs. Creamy peanut butter or other nut butters. Lean, well-cooked meats, such as chicken or fish. Tofu. Soups or broths. Dairy Low-fat dairy products, such as milk, cottage cheese, or yogurt. Beverages Water. Herbal tea. Apple juice. Sweets and Desserts Pudding. Custard. Fruit gelatin. Ice cream. Fats and Oils Mild salad dressings. Canola or olive oil. The items listed above may not be a complete list of allowed foods or beverages. Contact your dietitian for more options. What foods are not recommended? Foods and ingredients that are often not recommended include:  Spicy foods, such as hot sauce or salsa.  Fried foods.  Sour foods, such as pickled or fermented foods.  Raw vegetables or fruits, especially citrus or berries.  Caffeinated drinks.  Alcohol.  Strongly flavored seasonings or condiments.  The items listed above may not be a complete list of foods and beverages that are not allowed. Contact your dietitian for more information. This information is not intended to replace advice given to you by your health care provider. Make sure you discuss any questions you have with your health care provider. Document Released: 04/29/2015 Document Revised: 06/13/2015 Document Reviewed: 01/17/2014 Elsevier Interactive Patient Education  2018  Elsevier Inc.  

## 2016-09-29 NOTE — Progress Notes (Signed)
Subjective:    Patient ID: Michelle GlasgowYesenia N Macias, female    DOB: 03/17/1990, 26 y.o.   MRN: [id number]  HPI  Pt is a 26 yo female who presents to the clinic on her 3rd day of diarrhea, abdominal cramping, nausea. She has vomited a few times but not consisently. She has had loose stools since 6 last night. Denies any blood or melena in them. No one else in home is sick. No recent abx usage. No fever, chills, SOB. No recent travel. No rash. She has tried peptobismol with little relief. She has had no hospital exposure. She is 15 days late for her period but negative pregnancy test. Denies any dysuira. Not aware of anything she has eaten that could have been contaminated.   .. Active Ambulatory Problems    Diagnosis Date Noted  . Pregnancy with history of pre-term labor 04/07/2011  . Supervision of high-risk pregnancy 04/13/2011  . Encounter for sterilization 12/08/2011  . Postoperative pain 12/11/2011  . Incisional infection 12/11/2011  . Hx of inguinal hernia repair 04/24/2014  . Hx of umbilical hernia repair 04/24/2014  . RUQ abdominal tenderness 04/24/2014  . Umbilical pain 04/24/2014  . Abdominal tenderness, RLQ (right lower quadrant) 04/24/2014  . Constipation 04/30/2014  . 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 Vitamin D deficiency 05/30/2014  . Upper abdominal pain 06/25/2015  . Atypical chest pain 10/08/2015  . Shortness of breath 10/08/2015  . Cough 10/08/2015  . Generalized abdominal pain 01/02/2016   Resolved Ambulatory Problems    Diagnosis Date Noted  . No Resolved Ambulatory Problems   Past Medical History:  Diagnosis Date  . Anemia   . H/O hernia repair   . Hx of thrombocytopenia   . Hypertension   . Preterm labor   . Tension headache         Review of Systems See HPI.     Objective:   Physical Exam  Constitutional: She is oriented to person, place, and time. She appears well-developed and well-nourished.  HENT:  Head: Normocephalic and  atraumatic.  Cardiovascular: Normal rate, regular rhythm and normal heart sounds.   Pulmonary/Chest: Effort normal and breath sounds normal.  Abdominal:  Diffuse abdominal tenderness in lower quadrants with no guarding or rebound.   Neurological: She is alert and oriented to person, place, and time.  Psychiatric: She has a normal mood and affect. Her behavior is normal.          Assessment & Plan:  Marland Kitchen.Marland Kitchen.Michelle CampusYesenia was seen today for diarrhea and vomiting.  Diagnoses and all orders for this visit:  Diarrhea, unspecified type -     CBC with Differential/Platelet -     hCG, serum, qualitative -     Stool culture -     C. difficile GDH and Toxin A/B  Non-intractable cyclical vomiting with nausea -     ondansetron (ZOFRAN) 4 MG tablet; Take 1 tablet (4 mg total) by mouth every 8 (eight) hours as needed for nausea or vomiting. -     CBC with Differential/Platelet -     hCG, serum, qualitative  Right lower quadrant pain -     CBC with Differential/Platelet -     hCG, serum, qualitative -     Stool culture -     C. difficile GDH and Toxin A/B  Missed period   Per patient she is 15 days late will get serum HCG.   Vitals perfect.   Unclear etiology. Likely viral gastroenteritis. Gave HO. Encouraged [employer]  diet. Gave timeline of one week for symptoms to resolve. STay hydrated.  Will check CBC if WBC elevated could consider CT sooner.  Will get stool cultures. zolfran for nausea given.

## 2016-11-04 ENCOUNTER — Ambulatory Visit: Payer: Self-pay

## 2016-11-11 ENCOUNTER — Ambulatory Visit (INDEPENDENT_AMBULATORY_CARE_PROVIDER_SITE_OTHER): Payer: Self-pay | Admitting: Physician Assistant

## 2016-11-11 DIAGNOSIS — Z23 Encounter for immunization: Secondary | ICD-10-CM

## 2016-11-30 ENCOUNTER — Ambulatory Visit (INDEPENDENT_AMBULATORY_CARE_PROVIDER_SITE_OTHER): Payer: Self-pay | Admitting: Physician Assistant

## 2016-11-30 ENCOUNTER — Encounter: Payer: Self-pay | Admitting: Physician Assistant

## 2016-11-30 VITALS — BP 117/78 | HR 93 | Temp 98.3°F | Ht 59.0 in | Wt 151.0 lb

## 2016-11-30 DIAGNOSIS — H6983 Other specified disorders of Eustachian tube, bilateral: Secondary | ICD-10-CM

## 2016-11-30 DIAGNOSIS — H6993 Unspecified Eustachian tube disorder, bilateral: Secondary | ICD-10-CM

## 2016-11-30 MED ORDER — PREDNISONE 50 MG PO TABS: ORAL_TABLET | ORAL | 0 refills | Status: DC

## 2016-11-30 NOTE — Progress Notes (Signed)
   Subjective:    Patient ID: Michelle [facility]ias, female    DOB: [birth year], 26 y.o.   MRN: [id number]  HPI Patient is a 26 year old female who presents to the clinic with bilateral ear pain.  Ear pain started about a week and a half ago with the right ear.  Her right ear got a little bit better and then she started having pain in her left ear.  She denies any ear discharge.  She denies any fever, chills, sinus pressure, cough, sore throat or headache.  She has not tried anything to make better.  .. Active Ambulatory Problems    Diagnosis Date Noted  . Pregnancy with history of pre-term labor 04/07/2011  . Supervision of high-risk pregnancy 04/13/2011  . Encounter for sterilization 12/08/2011  . Postoperative pain 12/11/2011  . Incisional infection 12/11/2011  . Hx of inguinal hernia repair 04/24/2014  . Hx of umbilical hernia repair 04/24/2014  . RUQ abdominal tenderness 04/24/2014  . Umbilical pain 04/24/2014  . Abdominal tenderness, RLQ (right lower quadrant) 04/24/2014  . Constipation 04/30/2014  . 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 Vitamin D deficiency 05/30/2014  . Upper abdominal pain 06/25/2015  . Atypical chest pain 10/08/2015  . Shortness of breath 10/08/2015  . Cough 10/08/2015  . Generalized abdominal pain 01/02/2016   Resolved Ambulatory Problems    Diagnosis Date Noted  . No Resolved Ambulatory Problems   Past Medical History:  Diagnosis Date  . Anemia   . H/O hernia repair   . Hx of thrombocytopenia   . Hypertension   . Preterm labor   . Tension headache       Review of Systems  All other systems reviewed and are negative.      Objective:   Physical Exam  Constitutional: She is oriented to person, place, and time. She appears well-developed and well-nourished.  HENT:  Head: Normocephalic and atraumatic.  Right Ear: External ear normal.  Nose: Nose normal.  Mouth/Throat: Oropharynx is clear and moist. No oropharyngeal exudate.  TM"s  clear bilaterally.  Tenderness over left tragus to palpation.  Negative for tenderness over maxillary sinuses to palpation.    Eyes: Conjunctivae are normal.  Neck: Normal range of motion. Neck supple.  Cardiovascular: Normal rate, regular rhythm and normal heart sounds.  Pulmonary/Chest: Effort normal and breath sounds normal. She has no wheezes.  Lymphadenopathy:    She has no cervical adenopathy.  Neurological: She is alert and oriented to person, place, and time.  Psychiatric: She has a normal mood and affect. Her behavior is normal.          Assessment & Plan:  Marland Kitchen.Marland Kitchen.Michelle Macias was seen today for otalgia.  Diagnoses and all orders for this visit:  Eustachian tube dysfunction, bilateral -     predniSONE (DELTASONE) 50 MG tablet; Take one tablet for 5 days.   Reassured patient I did not see any signs of infection today.  I do suspect eustachian tube dysfunction.  Handout given.  Given burst of prednisone and encouraged her to get Flonase/Rhinocort/Nasacort and use daily.  Follow-up as needed with new symptoms or worsening symptoms.

## 2016-11-30 NOTE — Patient Instructions (Signed)
Eustachian Tube Dysfunction The eustachian tube connects the middle ear to the back of the nose. It regulates air pressure in the middle ear by allowing air to move between the ear and nose. It also helps to drain fluid from the middle ear space. When the eustachian tube does not function properly, air pressure, fluid, or both can build up in the middle ear. Eustachian tube dysfunction can affect one or both ears. What are the causes? This condition happens when the eustachian tube becomes blocked or cannot open normally. This may result from:  Ear infections.  Colds and other upper respiratory infections.  Allergies.  Irritation, such as from cigarette smoke or acid from the stomach coming up into the esophagus (gastroesophageal reflux).  Sudden changes in air pressure, such as from descending in an airplane.  Abnormal growths in the nose or throat, such as nasal polyps, tumors, or enlarged tissue at the back of the throat (adenoids).  What increases the risk? This condition may be more likely to develop in people who smoke and people who are overweight. Eustachian tube dysfunction may also be more likely to develop in children, especially children who have:  Certain birth defects of the mouth, such as cleft palate.  Large tonsils and adenoids.  What are the signs or symptoms? Symptoms of this condition may include:  A feeling of fullness in the ear.  Ear pain.  Clicking or popping noises in the ear.  Ringing in the ear.  Hearing loss.  Loss of balance.  Symptoms may get worse when the air pressure around you changes, such as when you travel to an area of high elevation or fly on an airplane. How is this diagnosed? This condition may be diagnosed based on:  Your symptoms.  A physical exam of your ear, nose, and throat.  Tests, such as those that measure: ? The movement of your eardrum (tympanogram). ? Your hearing (audiometry).  How is this treated? Treatment  depends on the cause and severity of your condition. If your symptoms are mild, you may be able to relieve your symptoms by moving air into ("popping") your ears. If you have symptoms of fluid in your ears, treatment may include:  Decongestants.  Antihistamines.  Nasal sprays or ear drops that contain medicines that reduce swelling (steroids).  In some cases, you may need to have a procedure to drain the fluid in your eardrum (myringotomy). In this procedure, a small tube is placed in the eardrum to:  Drain the fluid.  Restore the air in the middle ear space.  Follow these instructions at home:  Take over-the-counter and prescription medicines only as told by your health care provider.  Use techniques to help pop your ears as recommended by your health care provider. These may include: ? Chewing gum. ? Yawning. ? Frequent, forceful swallowing. ? Closing your mouth, holding your nose closed, and gently blowing as if you are trying to blow air out of your nose.  Do not do any of the following until your health care provider approves: ? Travel to high altitudes. ? Fly in airplanes. ? Work in a pressurized cabin or room. ? Scuba dive.  Keep your ears dry. Dry your ears completely after showering or bathing.  Do not smoke.  Keep all follow-up visits as told by your health care provider. This is important. Contact a health care provider if:  Your symptoms do not go away after treatment.  Your symptoms come back after treatment.  You are   unable to pop your ears.  You have: ? A fever. ? Pain in your ear. ? Pain in your head or neck. ? Fluid draining from your ear.  Your hearing suddenly changes.  You become very dizzy.  You lose your balance. This information is not intended to replace advice given to you by your health care provider. Make sure you discuss any questions you have with your health care provider. Document Released: 02/01/2015 Document Revised: 06/13/2015  Document Reviewed: 01/24/2014 Elsevier Interactive Patient Education  2018 Elsevier Inc.  

## 2016-12-09 ENCOUNTER — Encounter: Payer: Self-pay | Admitting: Physician Assistant

## 2016-12-09 ENCOUNTER — Ambulatory Visit (INDEPENDENT_AMBULATORY_CARE_PROVIDER_SITE_OTHER): Payer: Self-pay | Admitting: Physician Assistant

## 2016-12-09 ENCOUNTER — Ambulatory Visit: Payer: Self-pay | Admitting: Physician Assistant

## 2016-12-09 VITALS — BP 112/79 | HR 85 | Ht 59.02 in | Wt 150.0 lb

## 2016-12-09 DIAGNOSIS — F419 Anxiety disorder, unspecified: Secondary | ICD-10-CM | POA: Insufficient documentation

## 2016-12-09 DIAGNOSIS — R454 Irritability and anger: Secondary | ICD-10-CM

## 2016-12-09 DIAGNOSIS — F32A Depression, unspecified: Secondary | ICD-10-CM

## 2016-12-09 DIAGNOSIS — F430 Acute stress reaction: Secondary | ICD-10-CM

## 2016-12-09 DIAGNOSIS — F5101 Primary insomnia: Secondary | ICD-10-CM

## 2016-12-09 DIAGNOSIS — F329 Major depressive disorder, single episode, unspecified: Secondary | ICD-10-CM

## 2016-12-09 MED ORDER — TRAZODONE HCL 50 MG PO TABS: 25.0000 mg | ORAL_TABLET | Freq: Every evening | ORAL | 3 refills | Status: DC | PRN

## 2016-12-09 MED ORDER — LORAZEPAM 0.5 MG PO TABS: 0.5000 mg | ORAL_TABLET | Freq: Three times a day (TID) | ORAL | 0 refills | Status: DC | PRN

## 2016-12-09 MED ORDER — ESCITALOPRAM OXALATE 10 MG PO TABS: 10.0000 mg | ORAL_TABLET | Freq: Every day | ORAL | 1 refills | Status: DC

## 2016-12-09 NOTE — Progress Notes (Signed)
Subjective:    Patient ID: Michelle Macias, female    DOB: [birth year], 26 y.o.   MRN: [id number]  HPI  Patient is a 26 year old female who presents to the clinic to discuss stress, anxiety, depression.  She brings with her 3 kids.  Patient feels very overwhelmed right now.  She reports anger, anxiety, stress, insomnia, crying uncontrollably.  She has had a lot happen over the past few months.  She lost her job, her father-in-law passed away, her father is not in her life anymore.  She has been a full-time housewife and that has also brought it stressors.  She has never tried any medication for this.  She has had thought she is better off dead but she has never made a plan and she reports how much she has to be there for the kids.  She is concerned because both her mother and father have had suicide attempts.  She does not want to be like them.  She has been to therapy in the past and she felt like it made it worse.  .. Active Ambulatory Problems    Diagnosis Date Noted  . Pregnancy with history of pre-term labor 04/07/2011  . Supervision of high-risk pregnancy 04/13/2011  . Encounter for sterilization 12/08/2011  . Postoperative pain 12/11/2011  . Incisional infection 12/11/2011  . Hx of inguinal hernia repair 04/24/2014  . Hx of umbilical hernia repair 04/24/2014  . RUQ abdominal tenderness 04/24/2014  . Umbilical pain 04/24/2014  . Abdominal tenderness, RLQ (right lower quadrant) 04/24/2014  . Constipation 04/30/2014  . 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 Vitamin D deficiency 05/30/2014  . Upper abdominal pain 06/25/2015  . Atypical chest pain 10/08/2015  . Shortness of breath 10/08/2015  . Cough 10/08/2015  . Generalized abdominal pain 01/02/2016  . Anxiety and depression 12/09/2016  . Outbursts of anger 12/09/2016  . Stress reaction 12/09/2016   Resolved Ambulatory Problems    Diagnosis Date Noted  . No Resolved Ambulatory Problems   Past Medical History:   Diagnosis Date  . Anemia   . H/O hernia repair   . Hx of thrombocytopenia   . Hypertension   . Preterm labor   . Tension headache       Review of Systems  All other systems reviewed and are negative.      Objective:   Physical Exam  Constitutional: She is oriented to person, place, and time. She appears well-developed and well-nourished.  HENT:  Head: Normocephalic and atraumatic.  Cardiovascular: Normal rate and regular rhythm.  Neurological: She is alert and oriented to person, place, and time.  Psychiatric: Her behavior is normal.          Assessment & Plan:  Marland Kitchen.Marland Kitchen.Diagnoses and all orders for this visit:  Stress reaction -     escitalopram (LEXAPRO) 10 MG tablet; Take 1 tablet (10 mg total) by mouth daily. -     LORazepam (ATIVAN) 0.5 MG tablet; Take 1 tablet (0.5 mg total) by mouth every 8 (eight) hours as needed for anxiety.  Outbursts of anger -     escitalopram (LEXAPRO) 10 MG tablet; Take 1 tablet (10 mg total) by mouth daily.  Anxiety and depression -     escitalopram (LEXAPRO) 10 MG tablet; Take 1 tablet (10 mg total) by mouth daily. -     LORazepam (ATIVAN) 0.5 MG tablet; Take 1 tablet (0.5 mg total) by mouth every 8 (eight) hours as needed for anxiety.  Primary insomnia -  traZODone (DESYREL) 50 MG tablet; Take 0.5-1 tablets (25-50 mg total) by mouth at bedtime as needed for sleep.   .. [facility] 12/09/2016 12/09/2016  Decreased Interest 1 1  Down, Depressed, Hopeless 2 2  PHQ - 2 Score 3 3  Altered sleeping 2 2  Tired, decreased energy 2 2  Change in appetite 1 1  Feeling bad or failure about yourself  2 2  Trouble concentrating 1 1  Moving slowly or fidgety/restless 2 2  Suicidal thoughts 1 1  PHQ-9 Score 14 14  Difficult doing work/chores Very difficult -   .. GAD 7 : Generalized Anxiety Score 12/09/2016 12/09/2016  Nervous, Anxious, on Edge 2 2  Control/stop worrying 1 1  Worry too much - different things 2 2   Trouble relaxing 2 2  Restless 2 2  Easily annoyed or irritable 3 3  Afraid - awful might happen 1 1  Total GAD 7 Score 13 13    Discussed with patient need for counseling or support.  She says in the past this has made her worse.  We will start her on Lexapro 5 mg and she can increase after 2-3 weeks as needed.  Would like to follow-up in 4-6 weeks.  I did give her a small quantity of Ativan to use sparingly.  Discussed abuse potential.  I use trazodone for sleep.  Instructed her to start with that 1/2 tablet.  Strongly encouraged her to identify coping mechanisms and structure her life to reduce her stress.  Side effects of medications discussed.  If there is any worsening depression please stop Lexapro and call office.  Follow-up in 4-6 weeks.  Marland Kitchen..Spent 30 minutes with patient and greater than 50 percent of visit spent counseling patient regarding treatment plan.

## 2017-02-10 ENCOUNTER — Other Ambulatory Visit: Payer: Self-pay | Admitting: Physician Assistant

## 2017-02-10 DIAGNOSIS — F430 Acute stress reaction: Secondary | ICD-10-CM

## 2017-02-10 DIAGNOSIS — F329 Major depressive disorder, single episode, unspecified: Secondary | ICD-10-CM

## 2017-02-10 DIAGNOSIS — F419 Anxiety disorder, unspecified: Secondary | ICD-10-CM

## 2017-02-10 DIAGNOSIS — R454 Irritability and anger: Secondary | ICD-10-CM

## 2017-03-05 ENCOUNTER — Ambulatory Visit: Payer: Self-pay | Admitting: Sports Medicine

## 2017-03-05 DIAGNOSIS — Z0189 Encounter for other specified special examinations: Secondary | ICD-10-CM

## 2017-05-18 ENCOUNTER — Ambulatory Visit (INDEPENDENT_AMBULATORY_CARE_PROVIDER_SITE_OTHER): Payer: Self-pay | Admitting: Sports Medicine

## 2017-05-18 DIAGNOSIS — O0991 Supervision of high risk pregnancy, unspecified, first trimester: Secondary | ICD-10-CM

## 2017-05-18 DIAGNOSIS — R1032 Left lower quadrant pain: Secondary | ICD-10-CM | POA: Insufficient documentation

## 2017-05-18 LAB — POCT URINALYSIS DIPSTICK
Bilirubin, UA: NEGATIVE
Blood, UA: NEGATIVE
Glucose, UA: NEGATIVE
Ketones, UA: NEGATIVE
Leukocytes, UA: NEGATIVE
Nitrite, UA: NEGATIVE
Protein, UA: NEGATIVE
Spec Grav, UA: 1.02 (ref 1.010–1.025)
Urobilinogen, UA: 0.2 E.U./dL
pH, UA: >=9 — AB (ref 5.0–8.0)

## 2017-05-18 LAB — POCT URINE PREGNANCY: Preg Test, Ur: POSITIVE — AB

## 2017-05-18 LAB — CBC WITH DIFFERENTIAL/PLATELET
Basophils Absolute: 30 cells/uL (ref 0–200)
Basophils Relative: 0.3 %
Eosinophils Absolute: 69 cells/uL (ref 15–500)
Eosinophils Relative: 0.7 %
HCT: 35.2 % (ref 35.0–45.0)
Hemoglobin: 12.1 g/dL (ref 11.7–15.5)
Lymphs Abs: 2574 {cells}/uL (ref 850–3900)
MCH: 29.8 pg (ref 27.0–33.0)
MCHC: 34.4 g/dL (ref 32.0–36.0)
MCV: 86.7 fL (ref 80.0–100.0)
MPV: 14.4 fL — ABNORMAL HIGH (ref 7.5–12.5)
Monocytes Relative: 8.7 %
Neutro Abs: 6366 cells/uL (ref 1500–7800)
Neutrophils Relative %: 64.3 %
Platelets: 174 10*3/uL (ref 140–400)
RBC: 4.06 Million/uL (ref 3.80–5.10)
RDW: 12.1 % (ref 11.0–15.0)
Total Lymphocyte: 26 %
WBC mixed population: 861 {cells}/uL (ref 200–950)
WBC: 9.9 Thousand/uL (ref 3.8–10.8)

## 2017-05-18 MED ORDER — HYDROCODONE-ACETAMINOPHEN 5-325 MG PO TABS: 1.0000 | ORAL_TABLET | Freq: Three times a day (TID) | ORAL | 0 refills | Status: DC | PRN

## 2017-05-18 NOTE — Assessment & Plan Note (Addendum)
I do suspect more abdominal wall pain, she did have some hematoma deep to the rectus abdominis. Pain was present and intensified with palpation when tightening the abdominal wall musculature, i.e. a positive Carnett Sign. Strapped with an abdominal binder, hydrocodone for pain. Avoid NSAIDs for now. Return to see me in a week. We did add a CBC with differential, CMP, amylase, lipase and beta hCG, she had a positive urine hCG here.  Last menstrual period was 04/19/2017. Currently on hCG and Ovidrel injections.

## 2017-05-18 NOTE — Progress Notes (Signed)
Subjective:    CC: abdominal pain   HPI: Cleatis Polka, with pmh significant for inguinal and umbilical hernias,  a positive pregnancy test and a recent confirmatory diagnosis of a small ovarian cyst, is a 27yo that presents in clinic today with a cc of side abdominal  pain.  The Pt reports that  2 days ago she was scooping up her son and began to experience a shooting pain in her left lower quadrant.  She reports that the pain radiates to the back at times but is a constant shooting pain.  Pt reports that tylenol did not relieve the pain.  Pt reports that activities such as going from sitting to standing, twisting, bending to the left and bending forward exacerbate the pain in her left lower quadrant. Pt reports that her last menstrual cycle started on April 1st.     I reviewed the past medical history, family history, social history, surgical history, and allergies today and no changes were needed.  Please see the problem list section below in epic for further details.  Past Medical History: Past Medical History:  Diagnosis Date  . Anemia   . H/O hernia repair    2 hernia repairs 1 umbilical & 1 groin  . Hx of thrombocytopenia    in pregnancy  . Hypertension    managed with lifestyle modification  . Preterm labor   . Tension headache    Past Surgical History: Past Surgical History:  Procedure Laterality Date  . HERNIA REPAIR     Inguinal 27 yo,, groin  . LAPAROSCOPIC TUBAL LIGATION  12/08/2011   Procedure: LAPAROSCOPIC TUBAL LIGATION;  Surgeon: Catalina Antigua, MD;  Location: [facility];  Service: Gynecology;  Laterality: Bilateral;  . UMBILICAL HERNIA REPAIR  2016   also had previous umbilical hernia repair   Social History: Social History   Socioeconomic History  . Marital status: Married    Spouse name: Not on file  . Number of children: Not on file  . Years of education: Not on file  . Highest education level: Not on file  Occupational History  . Not on file  Social  Needs  . Financial resource strain: Not on file  . Food insecurity:    Worry: Not on file    Inability: Not on file  . Transportation needs:    Medical: Not on file    Non-medical: Not on file  Tobacco Use  . Smoking status: Never Smoker  . Smokeless tobacco: Never Used  Substance and Sexual Activity  . Alcohol use: No  . Drug use: No  . Sexual activity: Not Currently    Comment: tubal  Lifestyle  . Physical activity:    Days per week: Not on file    Minutes per session: Not on file  . Stress: Not on file  Relationships  . Social connections:    Talks on phone: Not on file    Gets together: Not on file    Attends religious service: Not on file    Active member of club or organization: Not on file    Attends meetings of clubs or organizations: Not on file    Relationship status: Not on file  Other Topics Concern  . Not on file  Social History Narrative  . Not on file   Family History: Family History  Problem Relation Age of Onset  . Cancer Maternal Grandmother        breast cancer  . Diabetes Maternal Grandfather   . Hypertension  Mother   . Hyperlipidemia Mother   . Other Neg Hx    Allergies: Allergies  Allergen Reactions  . Contrast Media [Iodinated Diagnostic Agents] Anaphylaxis    Started today 05/06/15   Medications: See med rec.  Review of Systems: No fevers, chills, night sweats, weight loss, chest pain, or shortness of breath.   Objective:    General: Well Developed, well nourished, and in no acute distress.  Neuro: Alert and oriented x3, extra-ocular muscles intact, sensation grossly intact.  HEENT: Normocephalic, atraumatic, pupils equal round reactive to light, neck supple, no masses, no lymphadenopathy, thyroid nonpalpable.  Skin: Warm and dry, no rashes. Cardiac: Regular rate and rhythm, no murmurs rubs or gallops, no lower extremity edema.  Respiratory: Clear to auscultation bilaterally. Not using accessory muscles, speaking in full  sentences. Abdominal: Pt is bloated; Normal bowel sounds.  Pain was elicited with vagal maneuvers (cough, bearing down) and with palpation during tightening of the abdominal muscles (eg Positive Carnett's Test) just lateral (left) to the umbilicus and just below the umbilical line.   Impression and Recommendations:     Assessment Subrectus Sheath Hematoma secondary to a rectus sheath strain Utilized Ultrasonography imaging to visualize hypoooich area underneath the rectus sheath.  Pt was educated about her diagnosis and provided an abdominal binder to help compress the pathology.  Pt was informed that she should wear the abdominal binder when out and moving around but can remove it at night. Pt was told to routinely ice the injured area (2-3x a day for about 20 minutes at a time).   Pt was prescribed Vicodin (as needed) for the pain and told to follow up within a week.  Pt was advised to begin rehabilitative exercises once the pain subsided (in about a week)   ___________________________________________ Ihor Austin. Benjamin Stain, M.D., ABFM., CAQSM. Primary Care and Sports Medicine [facility]  Adjunct Instructor of Family Medicine  [employer]

## 2017-05-19 LAB — COMPREHENSIVE METABOLIC PANEL
AG Ratio: 1.5 (calc) (ref 1.0–2.5)
ALT: 12 U/L (ref 6–29)
AST: 17 U/L (ref 10–30)
Albumin: 4.3 g/dL (ref 3.6–5.1)
Alkaline phosphatase (APISO): 79 U/L (ref 33–115)
BUN: 8 mg/dL (ref 7–25)
CO2: 25 mmol/L (ref 20–32)
Calcium: 9.3 mg/dL (ref 8.6–10.2)
Potassium: 3.9 mmol/L (ref 3.5–5.3)
Total Bilirubin: 0.3 mg/dL (ref 0.2–1.2)

## 2017-05-19 LAB — COMPREHENSIVE METABOLIC PANEL WITH GFR
Chloride: 103 mmol/L (ref 98–110)
Creat: 0.62 mg/dL (ref 0.50–1.10)
Globulin: 2.8 g/dL (ref 1.9–3.7)
Glucose, Bld: 104 mg/dL — ABNORMAL HIGH (ref 65–99)
Sodium: 137 mmol/L (ref 135–146)
Total Protein: 7.1 g/dL (ref 6.1–8.1)

## 2017-05-19 LAB — AMYLASE: Amylase: 47 U/L (ref 21–101)

## 2017-05-19 LAB — HCG, SERUM, QUALITATIVE: Preg, Serum: POSITIVE — AB

## 2017-05-19 LAB — LIPASE: Lipase: 14 U/L (ref 7–60)

## 2017-05-20 LAB — HCG, QUANTITATIVE, PREGNANCY: HCG, Total, QN: 73 m[IU]/mL

## 2017-05-25 ENCOUNTER — Encounter: Payer: Self-pay | Admitting: Sports Medicine

## 2017-05-25 ENCOUNTER — Ambulatory Visit (INDEPENDENT_AMBULATORY_CARE_PROVIDER_SITE_OTHER): Payer: Self-pay | Admitting: Sports Medicine

## 2017-05-25 DIAGNOSIS — R1032 Left lower quadrant pain: Secondary | ICD-10-CM

## 2017-05-25 NOTE — Assessment & Plan Note (Signed)
Positive Carnett sign with hematoma deep to the rectus abdominis. This essentially ruled out intra-abdominal pathology. After an abdominal binder, pain medicine and the tincture of time her pain is for the most part gone now. No ultrasound-guided injection needed into the hematoma overlying the peritoneum. She does have follow-ups with Dr. April Manson with reproductive endocrinology. Return to see me as needed.

## 2017-05-25 NOTE — Progress Notes (Signed)
Subjective:    CC: Follow-up  HPI: Abdominal wall pain: Resolved with conservative measures.  I reviewed the past medical history, family history, social history, surgical history, and allergies today and no changes were needed.  Please see the problem list section below in epic for further details.  Past Medical History: Past Medical History:  Diagnosis Date  . Anemia   . H/O hernia repair    2 hernia repairs 1 umbilical & 1 groin  . Hx of thrombocytopenia    in pregnancy  . Hypertension    managed with lifestyle modification  . Preterm labor   . Tension headache    Past Surgical History: Past Surgical History:  Procedure Laterality Date  . HERNIA REPAIR     Inguinal 27 yo,, groin  . LAPAROSCOPIC TUBAL LIGATION  12/08/2011   Procedure: LAPAROSCOPIC TUBAL LIGATION;  Surgeon: Catalina Antigua, MD;  Location: [facility];  Service: Gynecology;  Laterality: Bilateral;  . UMBILICAL HERNIA REPAIR  2016   also had previous umbilical hernia repair   Social History: Social History   Socioeconomic History  . Marital status: Married    Spouse name: Not on file  . Number of children: Not on file  . Years of education: Not on file  . Highest education level: Not on file  Occupational History  . Not on file  Social Needs  . Financial resource strain: Not on file  . Food insecurity:    Worry: Not on file    Inability: Not on file  . Transportation needs:    Medical: Not on file    Non-medical: Not on file  Tobacco Use  . Smoking status: Never Smoker  . Smokeless tobacco: Never Used  Substance and Sexual Activity  . Alcohol use: No  . Drug use: No  . Sexual activity: Not Currently    Comment: tubal  Lifestyle  . Physical activity:    Days per week: Not on file    Minutes per session: Not on file  . Stress: Not on file  Relationships  . Social connections:    Talks on phone: Not on file    Gets together: Not on file    Attends religious service: Not on file    Active  member of club or organization: Not on file    Attends meetings of clubs or organizations: Not on file    Relationship status: Not on file  Other Topics Concern  . Not on file  Social History Narrative  . Not on file   Family History: Family History  Problem Relation Age of Onset  . Cancer Maternal Grandmother        breast cancer  . Diabetes Maternal Grandfather   . Hypertension Mother   . Hyperlipidemia Mother   . Other Neg Hx    Allergies: Allergies  Allergen Reactions  . Contrast Media [Iodinated Diagnostic Agents] Anaphylaxis    Started today 05/06/15   Medications: See med rec.  Review of Systems: No fevers, chills, night sweats, weight loss, chest pain, or shortness of breath.   Objective:    General: Well Developed, well nourished, and in no acute distress.  Neuro: Alert and oriented x3, extra-ocular muscles intact, sensation grossly intact.  HEENT: Normocephalic, atraumatic, pupils equal round reactive to light, neck supple, no masses, no lymphadenopathy, thyroid nonpalpable.  Skin: Warm and dry, no rashes. Cardiac: Regular rate and rhythm, no murmurs rubs or gallops, no lower extremity edema.  Respiratory: Clear to auscultation bilaterally. Not using accessory  muscles, speaking in full sentences.  Impression and Recommendations:    Abdominal pain, left lower quadrant Positive Carnett sign with hematoma deep to the rectus abdominis. This essentially ruled out intra-abdominal pathology. After an abdominal binder, pain medicine and the tincture of time her pain is for the most part gone now. No ultrasound-guided injection needed into the hematoma overlying the peritoneum. She does have follow-ups with Dr. April Manson with reproductive endocrinology. Return to see me as needed. ___________________________________________ Ihor Austin. Benjamin Stain, M.D., ABFM., CAQSM. Primary Care and Sports Medicine [facility]  Adjunct Instructor of Family  Medicine  [employer]

## 2017-08-03 ENCOUNTER — Emergency Department
Admission: EM | Admit: 2017-08-03 | Discharge: 2017-08-03 | Disposition: A | Discharge status: Home / Self Care | Payer: Self-pay | Source: Home / Self Care | Attending: Family Medicine | Admitting: Family Medicine

## 2017-08-03 ENCOUNTER — Emergency Department (INDEPENDENT_AMBULATORY_CARE_PROVIDER_SITE_OTHER): Payer: Self-pay

## 2017-08-03 ENCOUNTER — Other Ambulatory Visit: Payer: Self-pay

## 2017-08-03 ENCOUNTER — Telehealth: Payer: Self-pay | Admitting: Physician Assistant

## 2017-08-03 DIAGNOSIS — R062 Wheezing: Secondary | ICD-10-CM

## 2017-08-03 DIAGNOSIS — R0602 Shortness of breath: Secondary | ICD-10-CM

## 2017-08-03 DIAGNOSIS — J683 Other acute and subacute respiratory conditions due to chemicals, gases, fumes and vapors: Secondary | ICD-10-CM

## 2017-08-03 MED ORDER — PREDNISONE 20 MG PO TABS: ORAL_TABLET | ORAL | 0 refills | Status: DC

## 2017-08-03 MED ORDER — ALBUTEROL SULFATE HFA 108 (90 BASE) MCG/ACT IN AERS: 2.0000 | INHALATION_SPRAY | RESPIRATORY_TRACT | 0 refills | Status: DC | PRN

## 2017-08-03 NOTE — ED Triage Notes (Signed)
Pt has been cleaning with bleach, and feels that she has breathed it in to the point her chest is now burning and she "tastes" the clorox in her throat.

## 2017-08-03 NOTE — Telephone Encounter (Signed)
Pt called clinic stating she was cleaning and using bleach, and inhaled "too many fumes" two days ago. Since then she has been having SOB with activity, chest pressure, and dizziness while walking. Advised to go to ED now for eval. Verbalized understanding.

## 2017-08-03 NOTE — Telephone Encounter (Signed)
Agree. Thanks

## 2017-08-03 NOTE — Discharge Instructions (Addendum)
Avoid breathing vapors from cleaning fluids

## 2017-08-03 NOTE — ED Provider Notes (Signed)
[facility]    CSN: [id number] Arrival date & time: 08/03/17  1408     History   Chief Complaint Chief Complaint  Patient presents with  . Shortness of Breath    HPI Michelle Macias is a 27 y.o. female.   Patient was at home cleaning with Chlorox bleach two days ago when she spilled Chlorox on the floor.  She breathed fumes from the solution and subsequently felt tightness in her anterior chest.  She has had persistent chest tightness and sensation of shortness of breath. She has had occasional wheezing, improved after using her son's albuterol inhaler.  She feels well otherwise.  No fevers, chills, and sweats.  She denies history of asthma.  The history is provided by the patient.  Shortness of Breath  Severity:  Mild Onset quality:  Sudden Duration:  2 days Timing:  Constant Progression:  Unchanged Chronicity:  New Context: strong odors   Relieved by:  Nothing Worsened by:  Activity Ineffective treatments:  None tried Associated symptoms: chest pain and wheezing   Associated symptoms: no abdominal pain, no cough, no diaphoresis, no fever, no hemoptysis, no PND, no sore throat, no sputum production, no syncope and no swollen glands     Past Medical History:  Diagnosis Date  . Anemia   . H/O hernia repair    2 hernia repairs 1 umbilical & 1 groin  . Hx of thrombocytopenia    in pregnancy  . Hypertension    managed with lifestyle modification  . Preterm labor   . Tension headache     Patient Active Problem List   Diagnosis Date Noted  . Abdominal pain, left lower quadrant 05/18/2017  . Anxiety and depression 12/09/2016  . Outbursts of anger 12/09/2016  . Stress reaction 12/09/2016  . Generalized abdominal pain 01/02/2016  . Atypical chest pain 10/08/2015  . Shortness of breath 10/08/2015  . Cough 10/08/2015  . Upper abdominal pain 06/25/2015  . Vitamin D deficiency 05/30/2014  . Constipation 04/30/2014  . Medullary calcification of kidney  04/30/2014  . Hx of inguinal hernia repair 04/24/2014  . Hx of umbilical hernia repair 04/24/2014  . RUQ abdominal tenderness 04/24/2014  . Umbilical pain 04/24/2014  . Abdominal tenderness, RLQ (right lower quadrant) 04/24/2014  . Postoperative pain 12/11/2011  . Incisional infection 12/11/2011  . Encounter for sterilization 12/08/2011  . Supervision of high-risk pregnancy 04/13/2011  . Pregnancy with history of pre-term labor 04/07/2011    Past Surgical History:  Procedure Laterality Date  . HERNIA REPAIR     Inguinal 27 yo,, groin  . LAPAROSCOPIC TUBAL LIGATION  12/08/2011   Procedure: LAPAROSCOPIC TUBAL LIGATION;  Surgeon: Catalina Antigua, MD;  Location: [facility];  Service: Gynecology;  Laterality: Bilateral;  . UMBILICAL HERNIA REPAIR  2016   also had previous umbilical hernia repair    OB History    Gravida  4   Para  3   Term  2   Preterm  1   AB  1   Living  3     SAB  1   TAB      Ectopic      Multiple      Live Births  2            Home Medications    Prior to Admission medications   Medication Sig Start Date End Date Taking? Authorizing Provider  albuterol (PROVENTIL HFA;VENTOLIN HFA) 108 (90 Base) MCG/ACT inhaler Inhale 2 puffs into the lungs  every 4 (four) hours as needed for wheezing or shortness of breath. 08/03/17   Lattie Haw, MD  EPINEPHrine 0.3 mg/0.3 mL IJ SOAJ injection Inject 0.3 mLs (0.3 mg total) into the muscle once. 05/07/15   Mabe, Latanya Maudlin, MD  escitalopram (LEXAPRO) 10 MG tablet TAKE 1 TABLET BY MOUTH EVERY DAY 02/10/17   Breeback, Jade L, PA-C  HYDROcodone-acetaminophen (NORCO/VICODIN) 5-325 MG tablet Take 1 tablet by mouth every 8 (eight) hours as needed for moderate pain. 05/18/17   Monica Becton, MD  LORazepam (ATIVAN) 0.5 MG tablet Take 1 tablet (0.5 mg total) by mouth every 8 (eight) hours as needed for anxiety. 12/09/16   Breeback, Lonna Cobb, PA-C  predniSONE (DELTASONE) 20 MG tablet Take one tab by mouth twice  daily for 4 days, then one daily. Take with food. 08/03/17   Lattie Haw, MD  traZODone (DESYREL) 50 MG tablet Take 0.5-1 tablets (25-50 mg total) by mouth at bedtime as needed for sleep. 12/09/16   Jomarie Longs, PA-C    Family History Family History  Problem Relation Age of Onset  . Cancer Maternal Grandmother        breast cancer  . Diabetes Maternal Grandfather   . Hypertension Mother   . Hyperlipidemia Mother   . Other Neg Hx     Social History Social History   Tobacco Use  . Smoking status: Never Smoker  . Smokeless tobacco: Never Used  Substance Use Topics  . Alcohol use: No  . Drug use: No     Allergies   Contrast media [iodinated diagnostic agents]   Review of Systems Review of Systems  Constitutional: Negative for diaphoresis and fever.  HENT: Negative for sore throat.   Respiratory: Positive for shortness of breath and wheezing. Negative for cough, hemoptysis and sputum production.   Cardiovascular: Positive for chest pain. Negative for syncope and PND.  Gastrointestinal: Negative for abdominal pain.  All other systems reviewed and are negative.    Physical Exam Triage Vital Signs ED Triage Vitals  Enc Vitals Group     BP 08/03/17 1432 120/87     Pulse Rate 08/03/17 1432 83     Resp --      Temp 08/03/17 1432 98.4 F (36.9 C)     Temp Source 08/03/17 1432 Oral     SpO2 08/03/17 1432 99 %     Weight 08/03/17 1433 144 lb (65.3 kg)     Height 08/03/17 1433 4\' 11"  (1.499 m)     Head Circumference --      Peak Flow --      Pain Score 08/03/17 1433 5     Pain Loc --      Pain Edu? --      Excl. in GC? --    No data found.  Updated Vital Signs BP 120/87 (BP Location: Right Arm)   Pulse 83   Temp 98.4 F (36.9 C) (Oral)   Ht 4\' 11"  (1.499 m)   Wt 144 lb (65.3 kg)   LMP 07/05/2017   SpO2 99%   BMI 29.08 kg/m   Visual Acuity Right Eye Distance:   Left Eye Distance:   Bilateral Distance:    Right Eye Near:   Left Eye Near:      Bilateral Near:     Physical Exam Nursing notes and Vital Signs reviewed. Appearance:  Patient appears stated age, and in no acute distress.    Eyes:  Pupils are equal, round, and  reactive to light and accomodation.  Extraocular movement is intact.  Conjunctivae are not inflamed   Pharynx:  Normal; moist mucous membranes  Neck:  Supple.  No adenopathy Lungs:  Clear to auscultation.  Breath sounds are equal.  Moving air well. Heart:  Regular rate and rhythm without murmurs, rubs, or gallops.  Abdomen:  Nontender without masses or hepatosplenomegaly.  Bowel sounds are present.  No CVA or flank tenderness.  Extremities:  No edema.  Skin:  No rash present.     UC Treatments / Results  Labs (all labs ordered are listed, but only abnormal results are displayed) Labs Reviewed - No data to display  EKG None  Radiology Dg Chest 2 View  Result Date: 08/03/2017 CLINICAL DATA:  Persistent chest tightness, shortness of breath, and wheezing after breathing in bleach fumes 2 days ago. EXAM: CHEST - 2 VIEW COMPARISON:  Chest x-ray dated October 07, 2015. FINDINGS: The heart size and mediastinal contours are within normal limits. Both lungs are clear. The visualized skeletal structures are unremarkable. IMPRESSION: Normal chest. Electronically Signed   By: Obie DredgeWilliam T Derry M.D.   On: 08/03/2017 15:46    Procedures Procedures (including critical care time)  Medications Ordered in UC Medications - No data to display  Initial Impression / Assessment and Plan / UC Course  I have reviewed the triage vital signs and the nursing notes.  Pertinent labs & imaging results that were available during my care of the patient were reviewed by me and considered in my medical decision making (see chart for details).    Negative chest X-ray and normal SpO2 reassuring. Begin prednisone burst/taper. Rx for albuterol inhaler. Followup with Family Doctor if not improved in one week.    Final Clinical  Impressions(s) / UC Diagnoses   Final diagnoses:  Reactive airways dysfunction syndrome [facility](HCC)     Discharge Instructions     Avoid breathing vapors from cleaning fluids    ED Prescriptions    Medication Sig Dispense Auth. Provider   predniSONE (DELTASONE) 20 MG tablet Take one tab by mouth twice daily for 4 days, then one daily. Take with food. 12 tablet Lattie HawBeese, Avyukt Cimo A, MD   albuterol (PROVENTIL HFA;VENTOLIN HFA) 108 (90 Base) MCG/ACT inhaler Inhale 2 puffs into the lungs every 4 (four) hours as needed for wheezing or shortness of breath. 1 Inhaler Lattie HawBeese, Glanda Spanbauer A, MD         Lattie HawBeese, Adalene Gulotta A, MD 08/03/17 [phone]

## 2017-10-22 ENCOUNTER — Ambulatory Visit (INDEPENDENT_AMBULATORY_CARE_PROVIDER_SITE_OTHER): Payer: Self-pay | Admitting: Physician Assistant

## 2017-10-22 ENCOUNTER — Encounter: Payer: Self-pay | Admitting: Physician Assistant

## 2017-10-22 VITALS — BP 121/88 | HR 71 | Temp 98.1°F | Wt 147.0 lb

## 2017-10-22 DIAGNOSIS — R1032 Left lower quadrant pain: Secondary | ICD-10-CM

## 2017-10-22 LAB — POCT URINALYSIS DIPSTICK
BILIRUBIN UA: NEGATIVE
Glucose, UA: NEGATIVE
Ketones, UA: NEGATIVE
LEUKOCYTES UA: NEGATIVE
Nitrite, UA: NEGATIVE
Protein, UA: NEGATIVE
SPEC GRAV UA: 1.025 (ref 1.010–1.025)
Urobilinogen, UA: 0.2 E.U./dL
pH, UA: 6.5 (ref 5.0–8.0)

## 2017-10-22 MED ORDER — HYDROCODONE-ACETAMINOPHEN 5-325 MG PO TABS: 1.0000 | ORAL_TABLET | ORAL | 0 refills | Status: DC | PRN

## 2017-10-22 MED ORDER — NAPROXEN 500 MG PO TABS: 500.0000 mg | ORAL_TABLET | Freq: Two times a day (BID) | ORAL | 3 refills | Status: DC

## 2017-10-22 NOTE — Patient Instructions (Signed)
Will make urgent referral.

## 2017-10-22 NOTE — Progress Notes (Signed)
Subjective:     Patient ID: Michelle Macias, female   DOB: 1990-07-12, 27 y.o.   MRN: [id number]  HPI Patient is a 27 yo female with a significant history of an ectopic pregnancy presenting today complaining of left sided pain. She states that she was diagnosed with an ectopic pregnancy and was seen at [facility] on September 03, 2017 to receive methotrexate. She only required one dose of methotrexate. She has had left sided pain since the methotrexate and was told that it would go away after she started menstruating again. She says that it did not go away after her period return and followed-up with [facility] where the did an ultrasound and told her that her left tube was full of fluid and sealed off. She says they told her they are unable to do surgery on it until March because of the doctor's availability. She wants a second opinion because she is in a lot of pain. She states the pain is an 8/10 and is painful when she moves and it is difficult to sleep. It feels like a pressure and states she feels like it is pressing on a nerve as she has some urinary hesitancy and pain going down her left leg. She denies hematuria or dysuria. She complains of discharge that is pink in color. She states she sometimes feels like she has fever and chills, but she denies nausea or vomiting. She has tried taking ibuprofen, but it has not helped.  .. Active Ambulatory Problems    Diagnosis Date Noted  . Pregnancy with history of pre-term labor 04/07/2011  . Supervision of high-risk pregnancy 04/13/2011  . Encounter for sterilization 12/08/2011  . Postoperative pain 12/11/2011  . Incisional infection 12/11/2011  . Hx of inguinal hernia repair 04/24/2014  . Hx of umbilical hernia repair 04/24/2014  . RUQ abdominal tenderness 04/24/2014  . Umbilical pain 04/24/2014  . Abdominal tenderness, RLQ (right lower quadrant) 04/24/2014  . Constipation 04/30/2014  . Medullary  calcification of kidney 04/30/2014  . Vitamin D deficiency 05/30/2014  . Upper abdominal pain 06/25/2015  . Atypical chest pain 10/08/2015  . Shortness of breath 10/08/2015  . Cough 10/08/2015  . Generalized abdominal pain 01/02/2016  . Anxiety and depression 12/09/2016  . Outbursts of anger 12/09/2016  . Stress reaction 12/09/2016  . Abdominal pain, left lower quadrant 05/18/2017   Resolved Ambulatory Problems    Diagnosis Date Noted  . No Resolved Ambulatory Problems   Past Medical History:  Diagnosis Date  . Anemia   . H/O hernia repair   . Hx of thrombocytopenia   . Hypertension   . Preterm labor   . Tension headache      Review of Systems  All other systems reviewed and are negative.      Objective:   Physical Exam  Constitutional: She is oriented to person, place, and time. She appears well-developed and well-nourished.  Abdominal: Bowel sounds are normal. There is tenderness in the left lower quadrant. There is guarding. There is no CVA tenderness.  Tenderness to palpation and guarding localized to LLQ.   Musculoskeletal:       Lumbar back: She exhibits no tenderness.  Neurological: She is alert and oriented to person, place, and time.  Psychiatric: She has a normal mood and affect. Her behavior is normal.       Assessment:     Marland KitchenMarland KitchenIjeoma was seen today for abdominal pain.  Diagnoses and all orders for this visit:  Intermittent  left lower quadrant abdominal pain -     POCT Urinalysis Dipstick -     naproxen (NAPROSYN) 500 MG tablet; Take 1 tablet (500 mg total) by mouth 2 (two) times daily with a meal. -     Ambulatory referral to Gynecology  Abdominal pain, left lower quadrant -     naproxen (NAPROSYN) 500 MG tablet; Take 1 tablet (500 mg total) by mouth 2 (two) times daily with a meal. -     HYDROcodone-acetaminophen (NORCO/VICODIN) 5-325 MG tablet; Take 1-2 tablets by mouth every 4 (four) hours as needed for moderate pain or severe pain. -     Urine  Culture -     Ambulatory referral to Gynecology       Plan:     Given patient's pain level and lack of availability for surgery time at her fertility will make urgent referral to GYN. Will try to ensure patient is seen next week. Prescribed Hydrocodone to take as needed for pain to last until her GYN appointment. Discussed STOP AcT and abuse potential of narcotics.  Also prescribed naproxen as an alternative to ibuprofen to help with pain.   Pt strongly encouraged to get copy of notes and ultrasounds so that GYN can evaluate more quickly. I cannot not see any notes or imaging regarding this.    controlled substance database reviewed with no concerns.   .. Results for orders placed or performed in visit on 10/22/17  Urine Culture  Result Value Ref Range   MICRO NUMBER: [id number]    SPECIMEN QUALITY: ADEQUATE    Sample Source URINE    STATUS: PRELIMINARY    Result: Culture in progress   POCT Urinalysis Dipstick  Result Value Ref Range   Color, UA yellow    Clarity, UA clear    Glucose, UA Negative Negative   Bilirubin, UA negative    Ketones, UA negative    Spec Grav, UA 1.025 1.010 - 1.025   Blood, UA tace    pH, UA 6.5 5.0 - 8.0   Protein, UA Negative Negative   Urobilinogen, UA 0.2 0.2 or 1.0 E.U./dL   Nitrite, UA negative    Leukocytes, UA Negative Negative   Appearance     Odor       .Marland KitchenSpent 30 minutes with patient and greater than 50 percent of visit spent counseling patient regarding treatment plan.   Marland KitchenHarlon Flor PA-C, have reviewed and agree with the above documentation in it's entirety.

## 2017-10-24 ENCOUNTER — Encounter: Payer: Self-pay | Admitting: Physician Assistant

## 2017-10-25 LAB — URINE CULTURE
MICRO NUMBER:: 91195803
SPECIMEN QUALITY:: ADEQUATE

## 2017-10-25 MED ORDER — NITROFURANTOIN MONOHYD MACRO 100 MG PO CAPS: 100.0000 mg | ORAL_CAPSULE | Freq: Two times a day (BID) | ORAL | 0 refills | Status: DC

## 2017-10-25 NOTE — Progress Notes (Signed)
Call pt: not a huge colony count but some bacteria detected on culture. Will send over abx to treat.   Confirm if patient has appt with GYN yet?

## 2017-10-25 NOTE — Addendum Note (Signed)
Addended by: Jomarie Longs on: 10/25/2017 09:26 PM   Modules accepted: Orders

## 2017-10-27 ENCOUNTER — Ambulatory Visit: Payer: Self-pay | Admitting: Obstetrics and Gynecology

## 2017-10-27 ENCOUNTER — Encounter: Payer: Self-pay | Admitting: Obstetrics and Gynecology

## 2017-10-27 ENCOUNTER — Ambulatory Visit (INDEPENDENT_AMBULATORY_CARE_PROVIDER_SITE_OTHER): Payer: Self-pay

## 2017-10-27 VITALS — BP 116/79 | HR 86 | Resp 16 | Ht 62.0 in | Wt 145.0 lb

## 2017-10-27 DIAGNOSIS — R102 Pelvic and perineal pain: Secondary | ICD-10-CM

## 2017-10-27 NOTE — Progress Notes (Signed)
27 yo P3 here for a second opinion on pelvic pain. Patient reports being diagnosed with a left ectopic pregnancy and treated on 09/03/2017 with methotrexate. She reports persistent LLQ pain since treatment. She was being followed by [facility]. She states that she was seen again recently with persistent and worsening LLQ pain and diagnosed with a hydrosalpinx with plans for surgery in March. Patient describes her pain as sharp and Michelle Macias pain which radiates down her leg and associated with pelvic pressure. Her pain is managed with naproxen and percocet. She does not want to wait until March for surgical intervention.  Past Medical History:  Diagnosis Date  . Anemia   . Ectopic pregnancy   . H/O hernia repair    2 hernia repairs 1 umbilical & 1 groin  . Hx of thrombocytopenia    in pregnancy  . Hypertension    managed with lifestyle modification  . Preterm labor   . Tension headache    Past Surgical History:  Procedure Laterality Date  . HERNIA REPAIR     Inguinal 27 yo,, groin  . LAPAROSCOPIC TUBAL LIGATION  12/08/2011   Procedure: LAPAROSCOPIC TUBAL LIGATION;  Surgeon: Catalina Antigua, MD;  Location: [facility];  Service: Gynecology;  Laterality: Bilateral;  . tubal reanastamosis    . UMBILICAL HERNIA REPAIR  2016   also had previous umbilical hernia repair   Family History  Problem Relation Age of Onset  . Cancer Maternal Grandmother        breast cancer  . Diabetes Maternal Grandfather   . Hypertension Mother   . Hyperlipidemia Mother   . Other Neg Hx    Social History   Tobacco Use  . Smoking status: Never Smoker  . Smokeless tobacco: Never Used  Substance Use Topics  . Alcohol use: No  . Drug use: No   ROS See pertinent in HPI  Blood pressure 116/79, pulse 86, resp. rate 16, height 5\' 2"  (1.575 m), weight 145 lb (65.8 kg), last menstrual period 10/16/2017, not currently breastfeeding. GENERAL: Well-developed, well-nourished female in no acute distress.   ABDOMEN: Soft, nontender, nondistended. No organomegaly. PELVIC: Normal external female genitalia. Vagina is pink and rugated.  Normal discharge. Normal appearing cervix. Uterus is normal in size. Left adnexal fullness and tenderness. EXTREMITIES: No cyanosis, clubbing, or edema, 2+ distal pulses.  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

## 2017-11-08 ENCOUNTER — Ambulatory Visit: Payer: Self-pay | Admitting: Obstetrics & Gynecology

## 2017-11-08 ENCOUNTER — Encounter: Payer: Self-pay | Admitting: Obstetrics & Gynecology

## 2017-11-08 VITALS — BP 110/75 | HR 82 | Resp 16 | Ht 62.0 in | Wt 147.0 lb

## 2017-11-08 DIAGNOSIS — R102 Pelvic and perineal pain: Secondary | ICD-10-CM

## 2017-11-08 DIAGNOSIS — N979 Female infertility, unspecified: Secondary | ICD-10-CM

## 2017-11-08 NOTE — Progress Notes (Signed)
   Subjective:    Patient ID: Michelle Macias, female    DOB: 1990/03/24, 27 y.o.   MRN: [id number]  HPI  27 year old female presents for continued left lower quadrant pain.  Transvaginal ultrasound done at [facility] showed no ovarian cysts, no hydrosalpinx, no uterine issues.  Pain is severe and decreases patient's ability to lead to her life.  She calls in sick to work.  She has to take Vicodin at night to go to sleep.  She denies any problems with her bowel movements or urination.  She wanted to have her left fallopian tube removed and she was told this could be done at [facility] until February or March.  Review of Systems  Constitutional: Negative.   Respiratory: Negative.   Cardiovascular: Negative.   Gastrointestinal: Positive for abdominal pain and vomiting. Negative for constipation, diarrhea and nausea.  Genitourinary: Positive for pelvic pain. Negative for flank pain, vaginal bleeding, vaginal discharge and vaginal pain.  Musculoskeletal: Negative.   Psychiatric/Behavioral: Negative.        Objective:   Physical Exam  Constitutional: She is oriented to person, place, and time. She appears well-developed and well-nourished. No distress.  HENT:  Head: Normocephalic and atraumatic.  Eyes: Conjunctivae are normal.  Cardiovascular: Normal rate.  Pulmonary/Chest: Effort normal.  Abdominal: Soft. Bowel sounds are normal. She exhibits no distension and no mass. There is tenderness. There is no rebound and no guarding.  Tenderness I on the left side of abdomen and pelvis.    Musculoskeletal: She exhibits no edema.  Neurological: She is alert and oriented to person, place, and time.  Skin: Skin is warm and dry.  Psychiatric: She has a normal mood and affect.  Vitals reviewed.  Vitals:   11/08/17 0942  BP: 110/75  Pulse: 82  Resp: 16  Weight: 147 lb (66.7 kg)  Height: 5\' 2"  (1.575 m)   Assessment & Plan:  27 year old female presents for left  lower quadrant pain wanting surgical intervention.  Patient is seen by [facility] and was treated with methotrexate for ectopic pregnancy.  She wants to do in vitro fertilization but needs to have her hydrosalpinx removed.  She was told this could not be done until February or March.  Call Dr. Lyndal Rainbow office, he has a new associate starting next week.  This should be able to be done sooner.  Note will be sent to patient through my chart and she should call the office and ask for an appointment with the new physician starting next week.  5 minutes spent face-to-face with patient with greater than 50% counseling.

## 2018-02-11 ENCOUNTER — Ambulatory Visit: Payer: Self-pay | Admitting: Family Medicine

## 2018-02-17 ENCOUNTER — Ambulatory Visit (INDEPENDENT_AMBULATORY_CARE_PROVIDER_SITE_OTHER): Payer: Self-pay | Admitting: Sports Medicine

## 2018-02-17 ENCOUNTER — Encounter: Payer: Self-pay | Admitting: Sports Medicine

## 2018-02-17 DIAGNOSIS — M79641 Pain in right hand: Secondary | ICD-10-CM

## 2018-02-17 DIAGNOSIS — Z3200 Encounter for pregnancy test, result unknown: Secondary | ICD-10-CM

## 2018-02-17 DIAGNOSIS — M79642 Pain in left hand: Secondary | ICD-10-CM

## 2018-02-17 DIAGNOSIS — M79601 Pain in right arm: Secondary | ICD-10-CM | POA: Insufficient documentation

## 2018-02-17 MED ORDER — ACETAMINOPHEN ER 650 MG PO TBCR: 650.0000 mg | EXTENDED_RELEASE_TABLET | Freq: Three times a day (TID) | ORAL | 3 refills | Status: DC | PRN

## 2018-02-17 NOTE — Assessment & Plan Note (Signed)
Since we are getting blood we are going to do a beta-hCG.

## 2018-02-17 NOTE — Assessment & Plan Note (Signed)
Multifactorial, likely related to carpal tunnel syndrome. There is also some overuse synovitis. She is trying to get pregnant so we will avoid NSAIDs. She will do arthritis strength Tylenol 3 times daily. 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 X-rays. Pain in the PIPs, significant morning stiffness as well as a doing a full rheumatoid test.

## 2018-02-17 NOTE — Progress Notes (Signed)
Subjective:    CC: Hand pain  HPI: This is a very pleasant 28 year old female, she has just started a cleaning company.  Unfortunately she has started to develop pain in her shoulders, elbows, hands and wrists.  She has significant paresthesias and numbness in the volar forearm and hands worse at night, worse with prolonged activity.  Also pain in the proximal interphalangeal joints, significant morning stiffness.  No family history of rheumatoid arthritis, lupus.  Symptoms are moderate, persistent.  Has not tried any medications yet for this.  She also tells me she might be pregnant.  I reviewed the past medical history, family history, social history, surgical history, and allergies today and no changes were needed.  Please see the problem list section below in epic for further details.  Past Medical History: Past Medical History:  Diagnosis Date  . Anemia   . Ectopic pregnancy   . H/O hernia repair    2 hernia repairs 1 umbilical & 1 groin  . Hx of thrombocytopenia    in pregnancy  . Hypertension    managed with lifestyle modification  . Preterm labor   . Tension headache    Past Surgical History: Past Surgical History:  Procedure Laterality Date  . HERNIA REPAIR     Inguinal 28 yo,, groin  . LAPAROSCOPIC TUBAL LIGATION  12/08/2011   Procedure: LAPAROSCOPIC TUBAL LIGATION;  Surgeon: Catalina AntiguaPeggy Constant, MD;  Location: [facility];  Service: Gynecology;  Laterality: Bilateral;  . tubal reanastamosis    . UMBILICAL HERNIA REPAIR  2016   also had previous umbilical hernia repair   Social History: Social History   Socioeconomic History  . Marital status: Married    Spouse name: Not on file  . Number of children: Not on file  . Years of education: Not on file  . Highest education level: Not on file  Occupational History  . Not on file  Social Needs  . Financial resource strain: Not on file  . Food insecurity:    Worry: Not on file    Inability: Not on file  . Transportation  needs:    Medical: Not on file    Non-medical: Not on file  Tobacco Use  . Smoking status: Never Smoker  . Smokeless tobacco: Never Used  Substance and Sexual Activity  . Alcohol use: No  . Drug use: No  . Sexual activity: Yes    Birth control/protection: None    Comment: tubal  Lifestyle  . Physical activity:    Days per week: Not on file    Minutes per session: Not on file  . Stress: Not on file  Relationships  . Social connections:    Talks on phone: Not on file    Gets together: Not on file    Attends religious service: Not on file    Active member of club or organization: Not on file    Attends meetings of clubs or organizations: Not on file    Relationship status: Not on file  Other Topics Concern  . Not on file  Social History Narrative  . Not on file   Family History: Family History  Problem Relation Age of Onset  . Cancer Maternal Grandmother        breast cancer  . Diabetes Maternal Grandfather   . Hypertension Mother   . Hyperlipidemia Mother   . Other Neg Hx    Allergies: Allergies  Allergen Reactions  . Contrast Media [Iodinated Diagnostic Agents] Anaphylaxis    Started  today 05/06/15   Medications: See med rec.  Review of Systems: No fevers, chills, night sweats, weight loss, chest pain, or shortness of breath.   Objective:    General: Well Developed, well nourished, and in no acute distress.  Neuro: Alert and oriented x3, extra-ocular muscles intact, sensation grossly intact.  HEENT: Normocephalic, atraumatic, pupils equal round reactive to light, neck supple, no masses, no lymphadenopathy, thyroid nonpalpable.  Skin: Warm and dry, no rashes. Cardiac: Regular rate and rhythm, no murmurs rubs or gallops, no lower extremity edema.  Respiratory: Clear to auscultation bilaterally. Not using accessory muscles, speaking in full sentences. Bilateral wrists: Inspection normal with no visible erythema or swelling. ROM smooth and normal with good  flexion and extension and ulnar/radial deviation that is symmetrical with opposite wrist. Palpation is normal over metacarpals, navicular, lunate, and TFCC; tendons without tenderness/ swelling No snuffbox tenderness. No tenderness over Canal of [place]. Strength 5/5 in all directions without pain. Positive bilateral Phalen signs, positive Tinel sign on the right Negative Finkelstein sign. Negative Watson's test.  Impression and Recommendations:    Bilateral hand pain Multifactorial, likely related to carpal tunnel syndrome. There is also some overuse synovitis. She is trying to get pregnant so we will avoid NSAIDs. She will do arthritis strength Tylenol 3 times daily. 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 X-rays. Pain in the PIPs, significant morning stiffness as well as a doing a full rheumatoid test.   Possible pregnancy Since we are getting blood we are going to do a beta-hCG. ___________________________________________ Ihor Austinhomas J. Benjamin Stainhekkekandam, M.D., ABFM., CAQSM. Primary Care and Sports Medicine [facility]  Adjunct Professor of Family Medicine  [employer]

## 2018-03-02 ENCOUNTER — Ambulatory Visit (INDEPENDENT_AMBULATORY_CARE_PROVIDER_SITE_OTHER): Payer: Self-pay

## 2018-03-02 ENCOUNTER — Ambulatory Visit: Payer: Self-pay

## 2018-03-02 DIAGNOSIS — N898 Other specified noninflammatory disorders of vagina: Secondary | ICD-10-CM

## 2018-03-02 NOTE — Progress Notes (Signed)
Pt here for a self swab. Pt c/o vaginal discharge and itching. Pt is aware we will call with results. 

## 2018-03-02 NOTE — Progress Notes (Signed)
Pt here for a self swab. Pt c/o vaginal discharge and itching. Pt is aware we will call with results.

## 2018-03-03 LAB — WET PREP FOR TRICH, YEAST, CLUE
MICRO NUMBER:: 186805
SPECIMEN QUALITY 3963: ADEQUATE

## 2018-03-17 ENCOUNTER — Encounter: Payer: Self-pay | Admitting: Sports Medicine

## 2018-03-17 ENCOUNTER — Ambulatory Visit (INDEPENDENT_AMBULATORY_CARE_PROVIDER_SITE_OTHER): Payer: Self-pay | Admitting: Sports Medicine

## 2018-03-17 DIAGNOSIS — M79642 Pain in left hand: Secondary | ICD-10-CM

## 2018-03-17 DIAGNOSIS — M79641 Pain in right hand: Secondary | ICD-10-CM

## 2018-03-17 MED ORDER — PREDNISONE 50 MG PO TABS: 50.0000 mg | ORAL_TABLET | Freq: Every day | ORAL | 0 refills | Status: DC

## 2018-03-17 NOTE — Assessment & Plan Note (Signed)
Persistent pain in spite of nighttime splinting, in fact she feels as though it made it worse. She has severe morning stiffness. Pain is predominantly in the PIPs. Tylenol arthritis strength 3 times a day was not efficacious. She is trying to get pregnant so we are avoiding NSAIDs, anticonvulsants and muscle relaxers. She still did not get her labs done or x-rays done. Adding 5 days of prednisone, if she has a dramatic response to prednisone and this would lend more towards rheumatoid arthritis.

## 2018-03-17 NOTE — Progress Notes (Signed)
Subjective:    CC: Bilateral hand pain  HPI: This is a pleasant 28 year old female, I treated her a month ago for bilateral hand pain, initially thought to be carpal tunnel syndrome.  She did not respond to nighttime splinting.  She continues to complain of pain in the PIPs, severe in the morning stiffness.  Arthritis from Tylenol does not help.  We did avoid certain medications as she and her husband are trying to get pregnant.  I reviewed the past medical history, family history, social history, surgical history, and allergies today and no changes were needed.  Please see the problem list section below in epic for further details.  Past Medical History: Past Medical History:  Diagnosis Date  . Anemia   . Ectopic pregnancy   . H/O hernia repair    2 hernia repairs 1 umbilical & 1 groin  . Hx of thrombocytopenia    in pregnancy  . Hypertension    managed with lifestyle modification  . Preterm labor   . Tension headache    Past Surgical History: Past Surgical History:  Procedure Laterality Date  . HERNIA REPAIR     Inguinal 28 yo,, groin  . LAPAROSCOPIC TUBAL LIGATION  12/08/2011   Procedure: LAPAROSCOPIC TUBAL LIGATION;  Surgeon: Catalina Antigua, MD;  Location: [facility];  Service: Gynecology;  Laterality: Bilateral;  . tubal reanastamosis    . UMBILICAL HERNIA REPAIR  2016   also had previous umbilical hernia repair   Social History: Social History   Socioeconomic History  . Marital status: Married    Spouse name: Not on file  . Number of children: Not on file  . Years of education: Not on file  . Highest education level: Not on file  Occupational History  . Not on file  Social Needs  . Financial resource strain: Not on file  . Food insecurity:    Worry: Not on file    Inability: Not on file  . Transportation needs:    Medical: Not on file    Non-medical: Not on file  Tobacco Use  . Smoking status: Never Smoker  . Smokeless tobacco: Never Used  Substance and  Sexual Activity  . Alcohol use: No  . Drug use: No  . Sexual activity: Yes    Birth control/protection: None    Comment: tubal  Lifestyle  . Physical activity:    Days per week: Not on file    Minutes per session: Not on file  . Stress: Not on file  Relationships  . Social connections:    Talks on phone: Not on file    Gets together: Not on file    Attends religious service: Not on file    Active member of club or organization: Not on file    Attends meetings of clubs or organizations: Not on file    Relationship status: Not on file  Other Topics Concern  . Not on file  Social History Narrative  . Not on file   Family History: Family History  Problem Relation Age of Onset  . Cancer Maternal Grandmother        breast cancer  . Diabetes Maternal Grandfather   . Hypertension Mother   . Hyperlipidemia Mother   . Other Neg Hx    Allergies: Allergies  Allergen Reactions  . Contrast Media [Iodinated Diagnostic Agents] Anaphylaxis    Started today 05/06/15   Medications: See med rec.  Review of Systems: No fevers, chills, night sweats, weight loss, chest pain,  or shortness of breath.   Objective:    General: Well Developed, well nourished, and in no acute distress.  Neuro: Alert and oriented x3, extra-ocular muscles intact, sensation grossly intact.  HEENT: Normocephalic, atraumatic, pupils equal round reactive to light, neck supple, no masses, no lymphadenopathy, thyroid nonpalpable.  Skin: Warm and dry, no rashes. Cardiac: Regular rate and rhythm, no murmurs rubs or gallops, no lower extremity edema.  Respiratory: Clear to auscultation bilaterally. Not using accessory muscles, speaking in full sentences.  Impression and Recommendations:    Bilateral hand pain Persistent pain in spite of nighttime splinting, in fact she feels as though it made it worse. She has severe morning stiffness. Pain is predominantly in the PIPs. Tylenol arthritis strength 3 times a day was  not efficacious. She is trying to get pregnant so we are avoiding NSAIDs, anticonvulsants and muscle relaxers. She still did not get her labs done or x-rays done. Adding 5 days of prednisone, if she has a dramatic response to prednisone and this would lend more towards rheumatoid arthritis.  __________________________________________ Ihor Austin. Benjamin Stain, M.D., ABFM., CAQSM. Primary Care and Sports Medicine [facility]  Adjunct Professor of Family Medicine  [employer]

## 2018-03-22 ENCOUNTER — Encounter: Payer: Self-pay | Admitting: Sports Medicine

## 2018-03-23 MED ORDER — PREDNISONE 10 MG (48) PO TBPK: ORAL_TABLET | Freq: Every day | ORAL | 0 refills | Status: DC

## 2018-03-29 ENCOUNTER — Ambulatory Visit: Payer: Self-pay | Admitting: Physician Assistant

## 2018-04-30 ENCOUNTER — Telehealth: Payer: Self-pay | Admitting: Nurse Practitioner

## 2018-04-30 DIAGNOSIS — J301 Allergic rhinitis due to pollen: Secondary | ICD-10-CM

## 2018-04-30 MED ORDER — ALBUTEROL SULFATE HFA 108 (90 BASE) MCG/ACT IN AERS: 2.0000 | INHALATION_SPRAY | RESPIRATORY_TRACT | 0 refills | Status: DC | PRN

## 2018-04-30 NOTE — Progress Notes (Signed)
E visit for Allergic Rhinitis We are sorry that you are not feeling well.  Here is how we plan to help!  Based on what you have shared with me it looks like you have Allergic Rhinitis.  Rhinitis is when a reaction occurs that causes nasal congestion, runny nose, sneezing, and itching.  Most types of rhinitis are caused by an inflammation and are associated with symptoms in the eyes ears or throat. There are several types of rhinitis.  The most common are acute rhinitis, which is usually caused by a viral illness, allergic or seasonal rhinitis, and nonallergic or year-round rhinitis.  Nasal allergies occur certain times of the year.  Allergic rhinitis is caused when allergens in the air trigger the release of histamine in the body.  Histamine causes itching, swelling, and fluid to build up in the fragile linings of the nasal passages, sinuses and eyelids.  An itchy nose and clear discharge are common.  I recommend the following over the counter treatments: Xyzal 5 mg take 1 tablet daily  I also would recommend a nasal spray: Flonase 2 sprays into each nostril once daily  You may also benefit from eye drops such as: Visine 1-2 drops each eye twice daily as needed   * I also refilled your albuterol HFA as you requested  HOME CARE:   You can use an over-the-counter saline nasal spray as needed  Avoid areas where there is heavy dust, mites, or molds  Stay indoors on windy days during the pollen season  Keep windows closed in home, at least in bedroom; use air conditioner.  Use high-efficiency house air filter  Keep windows closed in car, turn AC on re-circulate  Avoid playing out with dog during pollen season  GET HELP RIGHT AWAY IF:   If your symptoms do not improve within 10 days  You become short of breath  You develop yellow or green discharge from your nose for over 3 days  You have coughing fits  MAKE SURE YOU:   Understand these instructions  Will watch your  condition  Will get help right away if you are not doing well or get worse  Thank you for choosing an e-visit. Your e-visit answers were reviewed by a board certified advanced clinical practitioner to complete your personal care plan. Depending upon the condition, your plan could have included both over the counter or prescription medications. Please review your pharmacy choice. Be sure that the pharmacy you have chosen is open so that you can pick up your prescription now.  If there is a problem you may message your provider in MyChart to have the prescription routed to another pharmacy. Your safety is important to [place]. If you have drug allergies check your prescription carefully.  For the next 24 hours, you can use MyChart to ask questions about today's visit, request a non-urgent call back, or ask for a work or school excuse from your e-visit provider. You will get an email in the next two days asking about your experience. I hope that your e-visit has been valuable and will speed your recovery.   5 minutes spent reviewing and documenting in chart.

## 2018-05-23 ENCOUNTER — Other Ambulatory Visit: Payer: Self-pay

## 2018-05-23 ENCOUNTER — Ambulatory Visit (INDEPENDENT_AMBULATORY_CARE_PROVIDER_SITE_OTHER): Payer: Self-pay

## 2018-05-23 ENCOUNTER — Ambulatory Visit (INDEPENDENT_AMBULATORY_CARE_PROVIDER_SITE_OTHER): Payer: Self-pay | Admitting: Physician Assistant

## 2018-05-23 ENCOUNTER — Encounter: Payer: Self-pay | Admitting: Physician Assistant

## 2018-05-23 ENCOUNTER — Other Ambulatory Visit: Payer: Self-pay | Admitting: Physician Assistant

## 2018-05-23 ENCOUNTER — Ambulatory Visit: Admission: RE | Admit: 2018-05-23 | Payer: Self-pay | Source: Ambulatory Visit

## 2018-05-23 VITALS — BP 115/73 | HR 84 | Temp 98.4°F | Ht 59.0 in | Wt 148.0 lb

## 2018-05-23 DIAGNOSIS — Z8719 Personal history of other diseases of the digestive system: Secondary | ICD-10-CM

## 2018-05-23 DIAGNOSIS — Z9889 Other specified postprocedural states: Secondary | ICD-10-CM

## 2018-05-23 DIAGNOSIS — R1031 Right lower quadrant pain: Secondary | ICD-10-CM

## 2018-05-23 DIAGNOSIS — N29 Other disorders of kidney and ureter in diseases classified elsewhere: Secondary | ICD-10-CM

## 2018-05-23 DIAGNOSIS — R112 Nausea with vomiting, unspecified: Secondary | ICD-10-CM

## 2018-05-23 LAB — POCT URINE PREGNANCY: Preg Test, Ur: NEGATIVE

## 2018-05-23 MED ORDER — HYDROCODONE-ACETAMINOPHEN 5-325 MG PO TABS: 1.0000 | ORAL_TABLET | Freq: Four times a day (QID) | ORAL | 0 refills | Status: AC | PRN

## 2018-05-23 MED ORDER — CYCLOBENZAPRINE HCL 10 MG PO TABS: 10.0000 mg | ORAL_TABLET | Freq: Three times a day (TID) | ORAL | 0 refills | Status: DC | PRN

## 2018-05-23 MED ORDER — ONDANSETRON 8 MG PO TBDP: 8.0000 mg | ORAL_TABLET | Freq: Three times a day (TID) | ORAL | 0 refills | Status: DC | PRN

## 2018-05-23 NOTE — Progress Notes (Signed)
Pt has been called. No appendicitis and No hernia. 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

## 2018-05-23 NOTE — Progress Notes (Signed)
Subjective:     Patient ID: Michelle Macias, female   DOB: 12/03/1990, 28 y.o.   MRN: [id number]  HPI  Pt is a 28 yo female with history of lower abdominal pain with endometriosis, hernias, UTI, and constipation presents to the clinic with right lower abdominal pain for 2 days. Pain started right after she stretched to reach something in a high cabinet. She felt the pain after. Her last hernia repair was 2017 but she had had both inguinal and umbilical. Her UTI symptoms completely cleared from ED visit in march with negative for any STDs. She has no urinary symptoms today. She had GYN surgery to clear out endometrosis late 2019. Her overall abdominal pain has been better. She is really nauseated and not able to keep food down. Pain is 10/10 with some movements. Laying down and getting up is painful. She has had bowel movements recently and normal for her. No fever, chills, body aches, dysuria, vaginal discharge.   .. Active Ambulatory Problems    Diagnosis Date Noted  . Pregnancy with history of pre-term labor 04/07/2011  . Supervision of high-risk pregnancy 04/13/2011  . Encounter for sterilization 12/08/2011  . Postoperative pain 12/11/2011  . Incisional infection 12/11/2011  . History of hernia repair 04/24/2014  . Hx of umbilical hernia repair 04/24/2014  . RUQ abdominal tenderness 04/24/2014  . Umbilical pain 04/24/2014  . Abdominal tenderness, RLQ (right lower quadrant) 04/24/2014  . Constipation 04/30/2014  . 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 Vitamin D deficiency 05/30/2014  . Upper abdominal pain 06/25/2015  . Atypical chest pain 10/08/2015  . Shortness of breath 10/08/2015  . Cough 10/08/2015  . Generalized abdominal pain 01/02/2016  . Anxiety and depression 12/09/2016  . Outbursts of anger 12/09/2016  . Stress reaction 12/09/2016  . Abdominal pain, left lower quadrant 05/18/2017  . Bilateral hand pain 02/17/2018  . Possible pregnancy 02/17/2018   Resolved  Ambulatory Problems    Diagnosis Date Noted  . No Resolved Ambulatory Problems   Past Medical History:  Diagnosis Date  . Anemia   . Ectopic pregnancy   . H/O hernia repair   . Hx of thrombocytopenia   . Hypertension   . Preterm labor   . Tension headache      Review of Systems See HPI.     Objective:   Physical Exam Vitals signs reviewed.  Constitutional:      Appearance: Normal appearance.  HENT:     Head: Normocephalic.  Cardiovascular:     Rate and Rhythm: Normal rate.  Pulmonary:     Effort: Pulmonary effort is normal.  Abdominal:     General: Bowel sounds are normal. There is distension.     Palpations: There is no mass.     Tenderness: There is abdominal tenderness. There is rebound. There is no right CVA tenderness, left CVA tenderness or guarding.     Hernia: No hernia is present.     Comments: Pain with psoas sign.  Lots of pain with laying supine and getting up.   Neurological:     General: No focal deficit present.     Mental Status: She is alert and oriented to person, place, and time.  Psychiatric:        Mood and Affect: Mood normal.        Behavior: Behavior normal.        Assessment:     Marland KitchenMarland KitchenHailynn was seen today for hernia.  Diagnoses and all orders for this visit:  Right lower quadrant abdominal pain -     CT Abdomen Pelvis Wo Contrast -     HYDROcodone-acetaminophen (NORCO/VICODIN) 5-325 MG tablet; Take 1 tablet by mouth every 6 (six) hours as needed for up to 5 days for severe pain. -     cyclobenzaprine (FLEXERIL) 10 MG tablet; Take 1 tablet (10 mg total) by mouth 3 (three) times daily as needed for muscle spasms. -     ondansetron (ZOFRAN-ODT) 8 MG disintegrating tablet; Take 1 tablet (8 mg total) by mouth every 8 (eight) hours as needed for nausea.  Non-intractable vomiting with nausea, unspecified vomiting type -     POCT urine pregnancy -     ondansetron (ZOFRAN-ODT) 8 MG disintegrating tablet; Take 1 tablet (8 mg total) by mouth  every 8 (eight) hours as needed for nausea.  History of hernia repair       Plan:     Unclear etiology today but with abdominal pain that severe and nausea and vomiting. Stat CT to rule out appendicitis vs strangulated hernia.   CT was negative for both.   Suspect abdominal strain. Suggested alternating ibuprofen and tylenol for pain. A small quanity of norco for more severe pain given. Use ace wrap for support. In 1-2 days start really working on core exercise to strength core. It appears she has had abdominal pain like this before and been a strain. With hx of hernia your abdominal wall is really weak. Use cool compresses. zofran for nausea. Likely pain is causing the nausea and vomiting. I did give a muscle relaxer if she feels like abdominal wall is spasming. Call with any dysuria or discharge, fever, or changes. If she would like PT referral to get her started on core exercises we can do that to.     Marland Kitchen..Spent 30 minutes with patient and greater than 50 percent of visit spent counseling patient regarding treatment plan.

## 2018-05-23 NOTE — Patient Instructions (Addendum)
Will call with results

## 2018-05-30 ENCOUNTER — Encounter: Payer: Self-pay | Admitting: Physician Assistant

## 2018-07-31 ENCOUNTER — Ambulatory Visit (INDEPENDENT_AMBULATORY_CARE_PROVIDER_SITE_OTHER)
Admission: RE | Admit: 2018-07-31 | Discharge: 2018-07-31 | Disposition: A | Discharge status: Home / Self Care | Payer: Self-pay | Source: Ambulatory Visit

## 2018-07-31 ENCOUNTER — Inpatient Hospital Stay
Admission: RE | Admit: 2018-07-31 | Discharge: 2018-07-31 | Disposition: A | Discharge status: Home / Self Care | Payer: Self-pay | Source: Ambulatory Visit

## 2018-07-31 DIAGNOSIS — B9789 Other viral agents as the cause of diseases classified elsewhere: Secondary | ICD-10-CM

## 2018-07-31 DIAGNOSIS — Z20822 Contact with and (suspected) exposure to covid-19: Secondary | ICD-10-CM

## 2018-07-31 DIAGNOSIS — R0789 Other chest pain: Secondary | ICD-10-CM

## 2018-07-31 DIAGNOSIS — R531 Weakness: Secondary | ICD-10-CM

## 2018-07-31 DIAGNOSIS — R5383 Other fatigue: Secondary | ICD-10-CM

## 2018-07-31 DIAGNOSIS — R438 Other disturbances of smell and taste: Secondary | ICD-10-CM

## 2018-07-31 DIAGNOSIS — R509 Fever, unspecified: Secondary | ICD-10-CM

## 2018-07-31 DIAGNOSIS — J069 Acute upper respiratory infection, unspecified: Secondary | ICD-10-CM

## 2018-07-31 DIAGNOSIS — R6889 Other general symptoms and signs: Secondary | ICD-10-CM

## 2018-07-31 MED ORDER — ALBUTEROL SULFATE HFA 108 (90 BASE) MCG/ACT IN AERS: 2.0000 | INHALATION_SPRAY | RESPIRATORY_TRACT | 0 refills | Status: DC | PRN

## 2018-07-31 MED ORDER — BENZONATATE 100 MG PO CAPS: 100.0000 mg | ORAL_CAPSULE | Freq: Three times a day (TID) | ORAL | 0 refills | Status: DC

## 2018-07-31 NOTE — ED Provider Notes (Signed)
Virtual Visit via Video Note:  Michelle Macias  initiated request for Telemedicine visit with [facility] Urgent [facility] team. I connected with Michelle Macias  on 07/31/2018 at 3:29 PM  for a synchronized telemedicine visit using a video enabled HIPPA compliant telemedicine application. I verified that I am speaking with Michelle Macias  using two identifiers. Zigmund Gottron, NP  was physically located in a [facility] Urgent care site and Michelle Macias was located at a different location.   The limitations of evaluation and management by telemedicine as well as the availability of in-person appointments were discussed. Patient was informed that she  may incur a bill ( including co-pay) for this virtual visit encounter. Michelle Macias  expressed understanding and gave verbal consent to proceed with virtual visit.     History of Present Illness:Michelle Macias  is a 28 y.o. female presents with complaints of 3-4 days of fevers. Fatigued, weak. Chest tightness, squeezing sensation. No taste or sense of smell, no appetite. Occasional cough. Headache, body aches. Diarrhea 2 days ago, lasted one day, resolved. No nausea or vomiting. Some right sided abdominal pain. Husband had illness first with fever and headache. He has improved. He is a Theme park manager, one of his colleagues was ill. Patient is working from home. His of asthma. Has an inhaler, helps with symptoms. Contact-ulra OTC to help with fever and body aches, which has helped. Has to take it regularly. Tmax of 102, three days ago.   Past Medical History:  Diagnosis Date  . Anemia   . Ectopic pregnancy   . H/O hernia repair    2 hernia repairs 1 umbilical & 1 groin  . Hx of thrombocytopenia    in pregnancy  . Hypertension    managed with lifestyle modification  . Preterm labor   . Tension headache     Allergies  Allergen Reactions  . Contrast Media [Iodinated Diagnostic Agents] Anaphylaxis    Started today 05/06/15         Observations/Objective: Alert, oriented, non toxic in appearance. Clear coherent speech without difficulty. No increased work of breathing visualized.    Assessment and Plan: Fevers have been controlled. No increased work of breathing. Discussed with patient that this sounds consistent with Covid-19, as husband may have been exposed. Patient declines testing for this, opting for supportive treatment. Return precautions provided. Patient verbalized understanding and agreeable to plan.    Follow Up Instructions:    I discussed the assessment and treatment plan with the patient. The patient was provided an opportunity to ask questions and all were answered. The patient agreed with the plan and demonstrated an understanding of the instructions.   The patient was advised to call back or seek an in-person evaluation if the symptoms worsen or if the condition fails to improve as anticipated.  I provided 13 minutes of non-face-to-face time during this encounter.    Zigmund Gottron, NP  07/31/2018 3:29 PM         Zigmund Gottron, NP 07/31/18 [id number]

## 2018-07-31 NOTE — Discharge Instructions (Addendum)
Push fluids to ensure adequate hydration and keep secretions thin.  Tylenol and/or ibuprofen as needed for pain or fevers.  Over the counter treatments as needed for symptoms.  Use of inhaler as needed for wheezing or shortness of breath.   May try tessalon capsules as needed for cough. Self isolate, your husband or any other household member should self isolate as well. At least for 10 days or until have had no fever without medications for 3 days, whichever is longer.  If you develop any worsening of symptoms, unmanaged fever, increased chest pain , shortness of breath , or otherwise worsening please be seen in person.

## 2018-08-01 ENCOUNTER — Telehealth: Payer: Self-pay | Admitting: Physician Assistant

## 2018-08-01 ENCOUNTER — Encounter: Payer: Self-pay | Admitting: Physician Assistant

## 2018-08-01 DIAGNOSIS — R509 Fever, unspecified: Secondary | ICD-10-CM

## 2018-08-01 DIAGNOSIS — Z20822 Contact with and (suspected) exposure to covid-19: Secondary | ICD-10-CM

## 2018-08-01 DIAGNOSIS — G4489 Other headache syndrome: Secondary | ICD-10-CM

## 2018-08-01 DIAGNOSIS — R52 Pain, unspecified: Secondary | ICD-10-CM

## 2018-08-01 DIAGNOSIS — R05 Cough: Secondary | ICD-10-CM

## 2018-08-01 DIAGNOSIS — R059 Cough, unspecified: Secondary | ICD-10-CM

## 2018-08-01 NOTE — Progress Notes (Signed)
E-Visit for Corona Virus Screening   Your current symptoms could be consistent with the coronavirus.  Call your health care provider or local health department to request and arrange formal testing. Many health care providers can now test patients at their office but not all are.  Please quarantine yourself while awaiting your test results.  [place] [phone], [place], [place] [phone] or visit [email or web address]  and You have been enrolled in [facility] for COVID-19.  Daily you will receive a questionnaire within the [facility] website. Our COVID-19 response team will be monitoring your responses daily.    COVID-19 is a respiratory illness with symptoms that are similar to the flu. Symptoms are typically mild to moderate, but there have been cases of severe illness and death due to the virus. The following symptoms may appear 2-14 days after exposure: . Fever . Cough . Shortness of breath or difficulty breathing . Chills . Repeated shaking with chills . Muscle pain . Headache . Sore throat . New loss of taste or smell . Fatigue . Congestion or runny nose . Nausea or vomiting . Diarrhea  It is vitally important that if you feel that you have an infection such as this virus or any other virus that you stay home and away from places where you may spread it to others.  You should self-quarantine for 14 days if you have symptoms that could potentially be coronavirus or have been in close contact a with a person diagnosed with COVID-19 within the last 2 weeks. You should avoid contact with people age 30 and older.   You should wear a mask or cloth face covering over your nose and mouth if you must be around other people or animals, including pets (even at home). Try to stay at least 6 feet away from other people. This will protect the  people around you.  You can use medication such as A prescription cough medication called Tessalon Perles 100 mg. You may take 1-2 capsules every 8 hours as needed for cough. Review of your records shows that you were prescribed this medication at your doctor's visit yesterday. If so, you dont have get this prescription I sent you.   You may also take acetaminophen (Tylenol) as needed for fever.  I have also provided a work note.   Reduce your risk of any infection by using the same precautions used for avoiding the common cold or flu:  Marland Kitchen Wash your hands often with soap and warm water for at least 20 seconds.  If soap and water are not readily available, use an alcohol-based hand sanitizer with at least 60% alcohol.  . If coughing or sneezing, cover your mouth and nose by coughing or sneezing into the elbow areas of your shirt or coat, into a tissue or into your sleeve (not your hands). . Avoid shaking hands with others and consider head nods or verbal greetings only. . Avoid touching your eyes, nose, or mouth with unwashed hands.  . Avoid close contact with people who are sick. . Avoid places or events with large numbers of people in one location, like concerts or sporting events. . Carefully consider travel plans you have or are making. . If you are planning any travel outside or inside the [place], visit the CDC's Travelers' Health webpage for the latest health notices. . If you have some symptoms but not all symptoms, continue to monitor at home and seek medical attention if  your symptoms worsen. . If you are having a medical emergency, call 911.  HOME CARE . Only take medications as instructed by your medical team. . Drink plenty of fluids and get plenty of rest. . A steam or ultrasonic humidifier can help if you have congestion.   GET HELP RIGHT AWAY IF YOU HAVE EMERGENCY WARNING SIGNS** FOR COVID-19. If you or someone is showing any of these signs seek emergency medical care immediately.  Call 911 or proceed to your closest emergency facility if: . You develop worsening high fever. . Trouble breathing . Bluish lips or face . Persistent pain or pressure in the chest . New confusion . Inability to wake or stay awake . You cough up blood. . Your symptoms become more severe  **This list is not all possible symptoms. Contact your medical provider for any symptoms that are sever or concerning to you.   MAKE SURE YOU   Understand these instructions.  Will watch your condition.  Will get help right away if you are not doing well or get worse.  Your e-visit answers were reviewed by a board certified advanced clinical practitioner to complete your personal care plan.  Depending on the condition, your plan could have included both over the counter or prescription medications.  If there is a problem please reply once you have received a response from your provider.  Your safety is important to us.  If you have drug allergies check your prescription carefully.    You can use MyChart to ask questions about today's visit, request a non-urgent call back, or ask for a work or school excuse for 24 hours related to this e-Visit. If it has been greater than 24 hours you will need to follow up with your provider, or enter a new e-Visit to address those concerns. You will get an e-mail in the next two days asking about your experience.  I hope that your e-visit has been valuable and will speed your recovery. Thank you for using e-visits.   I spent 5-10 minutes on review and completion of this note- Illa LevelSahar Osman [facility]

## 2018-08-02 ENCOUNTER — Encounter (INDEPENDENT_AMBULATORY_CARE_PROVIDER_SITE_OTHER): Payer: Self-pay

## 2018-08-06 ENCOUNTER — Encounter (INDEPENDENT_AMBULATORY_CARE_PROVIDER_SITE_OTHER): Payer: Self-pay

## 2018-08-08 ENCOUNTER — Encounter (INDEPENDENT_AMBULATORY_CARE_PROVIDER_SITE_OTHER): Payer: Self-pay

## 2018-08-10 ENCOUNTER — Encounter (INDEPENDENT_AMBULATORY_CARE_PROVIDER_SITE_OTHER): Payer: Self-pay

## 2018-08-14 ENCOUNTER — Encounter (INDEPENDENT_AMBULATORY_CARE_PROVIDER_SITE_OTHER): Payer: Self-pay

## 2018-08-24 ENCOUNTER — Telehealth (INDEPENDENT_AMBULATORY_CARE_PROVIDER_SITE_OTHER): Payer: Self-pay | Admitting: Physician Assistant

## 2018-08-24 VITALS — Temp 98.3°F | Ht 59.0 in | Wt 142.0 lb

## 2018-08-24 DIAGNOSIS — R10813 Right lower quadrant abdominal tenderness: Secondary | ICD-10-CM

## 2018-08-24 DIAGNOSIS — Z8719 Personal history of other diseases of the digestive system: Secondary | ICD-10-CM

## 2018-08-24 DIAGNOSIS — G8929 Other chronic pain: Secondary | ICD-10-CM

## 2018-08-24 DIAGNOSIS — Z9889 Other specified postprocedural states: Secondary | ICD-10-CM

## 2018-08-24 DIAGNOSIS — R1084 Generalized abdominal pain: Secondary | ICD-10-CM

## 2018-08-24 MED ORDER — GABAPENTIN 100 MG PO CAPS: 100.0000 mg | ORAL_CAPSULE | Freq: Three times a day (TID) | ORAL | 1 refills | Status: DC

## 2018-08-24 NOTE — Progress Notes (Signed)
Patient ID: Michelle Macias, female   DOB: [birth year], 28 y.o.   MRN: [id number] Virtual Visit via Video Note  I connected with Michelle Macias on 08/24/18 at  7:50 AM EDT by a video enabled telemedicine application and verified that I am speaking with the correct person using two identifiers.  Location: Patient: home Provider: clinic   I discussed the limitations of evaluation and management by telemedicine and the availability of in person appointments. The patient expressed understanding and agreed to proceed.  History of Present Illness: Pt is a 28 yo female with a hx of hernia repairs and generalized abdominal pain more focused in RLQ and pelvic pain since 2017. Pt admits pain and symptoms since her last hernia repair in 2017. She has been doing stretchers since last visit with no improvement. CT of abdomen showed no acute findings. She feels pain, burning and pulling on the right side of abdomen. It is worsened by eaten and bloating. She is frequently nauseated. No vomiting. She feels like something is "stuck" in her stomach. She is having normal BM without blood. She denies any problems with urination. Pelvic pain is much better. She does admit to pain in abdomen when she orgasms.   .. Active Ambulatory Problems    Diagnosis Date Noted  . Pregnancy with history of pre-term labor 04/07/2011  . Supervision of high-risk pregnancy 04/13/2011  . Encounter for sterilization 12/08/2011  . Postoperative pain 12/11/2011  . Incisional infection 12/11/2011  . History of hernia repair 04/24/2014  . Hx of umbilical hernia repair 24/23/5361  . RUQ abdominal tenderness 04/24/2014  . Umbilical pain 44/31/5400  . Abdominal tenderness, RLQ (right lower quadrant) 04/24/2014  . Constipation 04/30/2014  . 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 Vitamin D deficiency 05/30/2014  . Upper abdominal pain 06/25/2015  . Atypical chest pain 10/08/2015  . Shortness of breath 10/08/2015  . Cough  10/08/2015  . Generalized abdominal pain 01/02/2016  . Anxiety and depression 12/09/2016  . Outbursts of anger 12/09/2016  . Stress reaction 12/09/2016  . Abdominal pain, left lower quadrant 05/18/2017  . Bilateral hand pain 02/17/2018  . Possible pregnancy 02/17/2018   Resolved Ambulatory Problems    Diagnosis Date Noted  . No Resolved Ambulatory Problems   Past Medical History:  Diagnosis Date  . Anemia   . Ectopic pregnancy   . H/O hernia repair   . Hx of thrombocytopenia   . Hypertension   . Preterm labor   . Tension headache    Reviewed med, allergy, problem list.    Observations/Objective: No acute distress. Normal mood.  Normal breathing.   .. Today's Vitals   08/24/18 0732  Temp: 98.3 F (36.8 C)  TempSrc: Oral  Weight: 142 lb (64.4 kg)  Height: 4\' 11"  (1.499 m)   Body mass index is 28.68 kg/m.    Assessment and Plan: Marland KitchenMarland KitchenChianne was seen today for abdominal pain.  Diagnoses and all orders for this visit:  Right lower quadrant abdominal tenderness, rebound tenderness presence not specified -     gabapentin (NEURONTIN) 100 MG capsule; Take 1 capsule (100 mg total) by mouth 3 (three) times daily.  Hx of umbilical hernia repair  History of hernia repair  Chronic generalized abdominal pain -     gabapentin (NEURONTIN) 100 MG capsule; Take 1 capsule (100 mg total) by mouth 3 (three) times daily.   With pain this long and normal CT and labs I am concerned for some kind of myofascial or neuropathic  pain. Will start gabapentin tid. Will refer to GI.   Follow Up Instructions:    I discussed the assessment and treatment plan with the patient. The patient was provided an opportunity to ask questions and all were answered. The patient agreed with the plan and demonstrated an understanding of the instructions.   The patient was advised to call back or seek an in-person evaluation if the symptoms worsen or if the condition fails to improve as  anticipated.  I provided 11 minutes of non-face-to-face time during this encounter.   Tandy GawJade Trae Bovenzi, PA-C

## 2018-08-24 NOTE — Progress Notes (Deleted)
Feels same as before  Pain and burning on right side of abdomen  Has not gotten better, been the same the whole time  More frequent nausea, no vomitting  Not able to eat as much, hurts after she eats

## 2018-08-26 ENCOUNTER — Encounter: Payer: Self-pay | Admitting: Gastroenterology

## 2018-09-15 ENCOUNTER — Encounter: Payer: Self-pay | Admitting: Gastroenterology

## 2018-09-15 ENCOUNTER — Other Ambulatory Visit: Payer: Self-pay

## 2018-09-15 ENCOUNTER — Ambulatory Visit: Payer: Self-pay | Admitting: Gastroenterology

## 2018-09-15 VITALS — BP 110/86 | HR 92 | Temp 98.7°F | Ht 59.0 in | Wt 147.0 lb

## 2018-09-15 DIAGNOSIS — R1031 Right lower quadrant pain: Secondary | ICD-10-CM

## 2018-09-15 DIAGNOSIS — R110 Nausea: Secondary | ICD-10-CM

## 2018-09-15 DIAGNOSIS — R630 Anorexia: Secondary | ICD-10-CM

## 2018-09-15 NOTE — Progress Notes (Signed)
Chief Complaint: Right-sided abdominal pain, nausea without vomiting  Referring Provider:     Jomarie LongsBreeback, Jade L, PA-C   HPI:    Michelle Macias is a 28 y.o. female referred to the Gastroenterology Clinic for evaluation of RLQ pain.  Pain is been present intermittently since 2017, following her inguinal hernia repair.  Has been evaluated by CT in 2017 and again in 05/2018 (both normal).    Pain getting more frequent and increasing intensity. Can be burning or sharp in quality. Occasionally radiates to LLQ and low back, but generally stays in the RLQ. Will have bloating with pain. Now daily. Worse with walking, playing with kids. Also post prandial pain, 10 mins after eating, indpendent of food types. Has trialed dietary mods to include GFD, lactose elimination, keto, all w/o improvement. No hematochezia, melena. No diarrhea, constipation. Pain lasts a few hours. +nocturnal pain.  Can provoke pain with palpation.  Also with new onset nausea without emesis. Thinks this is worse with increasing pain. Had lost 13# earlier this year, but has since regained most of the weight and back to baseline.   Was seen by her PCM on 8/5 for this issue.  Prescribed Neurontin for the RLQ pain with no improvement.   She has a history of umbilical repair x3, inguinal hernia repair, then endometrial ablation in 11/2017  No recent labs to review.  CT from 05/2018 with prior ventral hernia repair without recurrence, otherwise normal.  No known family history of CRC, GI malignancy, liver disease, pancreatic disease, or IBD.    Past Medical History:  Diagnosis Date  . Anemia   . Ectopic pregnancy   . H/O hernia repair    2 hernia repairs 1 umbilical & 1 groin  . Hx of thrombocytopenia    in pregnancy  . Hypertension    managed with lifestyle modification  . Preterm labor   . Tension headache      Past Surgical History:  Procedure Laterality Date  . ENDOMETRIAL ABLATION    . HERNIA  REPAIR     Inguinal 28 yo,, groin  . HERNIA REPAIR  2017   umbilical   . LAPAROSCOPIC TUBAL LIGATION  12/08/2011   Procedure: LAPAROSCOPIC TUBAL LIGATION;  Surgeon: Catalina AntiguaPeggy Constant, MD;  Location: [facility];  Service: Gynecology;  Laterality: Bilateral;  . tubal reanastamosis    . UMBILICAL HERNIA REPAIR  2016   also had previous umbilical hernia repair   Family History  Problem Relation Age of Onset  . Cancer Maternal Grandmother        breast cancer  . Diabetes Maternal Grandfather   . Hypertension Mother   . Hyperlipidemia Mother   . Other Neg Hx   . Colon cancer Neg Hx   . Esophageal cancer Neg Hx    Social History   Tobacco Use  . Smoking status: Never Smoker  . Smokeless tobacco: Never Used  Substance Use Topics  . Alcohol use: No  . Drug use: No   Current Outpatient Medications  Medication Sig Dispense Refill  . acetaminophen (TYLENOL) 650 MG CR tablet Take 1 tablet (650 mg total) by mouth every 8 (eight) hours as needed for pain. 90 tablet 3  . albuterol (VENTOLIN HFA) 108 (90 Base) MCG/ACT inhaler Inhale 2 puffs into the lungs every 4 (four) hours as needed for wheezing or shortness of breath. 8 g 0  . cyclobenzaprine (FLEXERIL) 10 MG tablet Take 1  tablet (10 mg total) by mouth 3 (three) times daily as needed for muscle spasms. 30 tablet 0  . gabapentin (NEURONTIN) 100 MG capsule Take 1 capsule (100 mg total) by mouth 3 (three) times daily. 90 capsule 1   No current facility-administered medications for this visit.    Allergies  Allergen Reactions  . Contrast Media [Iodinated Diagnostic Agents] Anaphylaxis    Started today 05/06/15     Review of Systems: All systems reviewed and negative except where noted in HPI.     Physical Exam:    Wt Readings from Last 3 Encounters:  09/15/18 147 lb (66.7 kg)  08/24/18 142 lb (64.4 kg)  05/23/18 148 lb (67.1 kg)    BP 110/86   Pulse 92   Temp 98.7 F (37.1 C)   Ht 4\' 11"  (1.499 m)   Wt 147 lb (66.7 kg)   BMI  29.69 kg/m  Constitutional:  Pleasant, in no acute distress. Psychiatric: Normal mood and affect. Behavior is normal. EENT: Pupils normal.  Conjunctivae are normal. No scleral icterus. Neck supple. No cervical LAD. Cardiovascular: Normal rate, regular rhythm. No edema Pulmonary/chest: Effort normal and breath sounds normal. No wheezing, rales or rhonchi. Abdominal: +Carnett's Sign and RLQ. Soft, nondistended. Bowel sounds active throughout. There are no masses palpable. No hepatomegaly.  Well-healed prior surgical incision sites. Neurological: Alert and oriented to person place and time. Skin: Skin is warm and dry. No rashes noted.   ASSESSMENT AND PLAN;   Michelle Macias is a 28 y.o. female presenting with   1) RLQ Pain 2) Nausea without vomiting 3) Decreased appetite  - EGD to evaluate for mucosal/luminal etiology.  Plan for gastric biopsies - Colonoscopy to evaluate for IBD - If above evaluation negative, plan for Abdominal wall injection for suspected Anterior Cutaneous Nerve Entrapment Syndrome -Resume p.o. intake as tolerated - Diarrhea exacerbating/alleviating factors of RLQ pain and nausea  The indications, risks, and benefits of EGD and colonoscopy were explained to the patient in detail. Risks include but are not limited to bleeding, perforation, adverse reaction to medications, and cardiopulmonary compromise. Sequelae include but are not limited to the possibility of surgery, hositalization, and mortality. The patient verbalized understanding and wished to proceed. All questions answered, referred to scheduler and bowel prep ordered. Further recommendations pending results of the exam.    Lavena Bullion, DO, FACG  09/15/2018, 10:47 AM   Donella Stade, PA-C

## 2018-09-15 NOTE — Patient Instructions (Addendum)
If you are age 29 or older, your body mass index should be between 23-30. Your Body mass index is 29.69 kg/m. If this is out of the aforementioned range listed, please consider follow up with your Primary Care Provider.  If you are age 61 or younger, your body mass index should be between 19-25. Your Body mass index is 29.69 kg/m. If this is out of the aformentioned range listed, please consider follow up with your Primary Care Provider.   To help prevent the possible spread of infection to our patients, communities, and staff; we will be implementing the following measures:  As of now we are not allowing any visitors/family members to accompany you to any upcoming appointments with [facility]. If you have any concerns about this please contact our office to discuss prior to the appointment.   You have been scheduled for an endoscopy and colonoscopy. Please follow the written instructions given to you at your visit today. Please pick up your prep supplies at the pharmacy within the next 1-3 days. If you use inhalers (even only as needed), please bring them with you on the day of your procedure. Your physician has requested that you go to www.startemmi.com and enter the access code given to you at your visit today. This web site gives a general overview about your procedure. However, you should still follow specific instructions given to you by our office regarding your preparation for the procedure.  We have given you samples of the following medication to take: Clenpiq  It was a pleasure to see you today!  Vito Cirigliano, D.O.

## 2018-09-30 ENCOUNTER — Encounter: Payer: Self-pay | Admitting: Physician Assistant

## 2018-10-04 ENCOUNTER — Telehealth (INDEPENDENT_AMBULATORY_CARE_PROVIDER_SITE_OTHER): Payer: Self-pay | Admitting: Physician Assistant

## 2018-10-04 ENCOUNTER — Encounter: Payer: Self-pay | Admitting: Physician Assistant

## 2018-10-04 VITALS — Ht 59.0 in | Wt 148.0 lb

## 2018-10-04 DIAGNOSIS — F410 Panic disorder [episodic paroxysmal anxiety] without agoraphobia: Secondary | ICD-10-CM

## 2018-10-04 DIAGNOSIS — R454 Irritability and anger: Secondary | ICD-10-CM

## 2018-10-04 DIAGNOSIS — F430 Acute stress reaction: Secondary | ICD-10-CM

## 2018-10-04 DIAGNOSIS — F439 Reaction to severe stress, unspecified: Secondary | ICD-10-CM

## 2018-10-04 DIAGNOSIS — F329 Major depressive disorder, single episode, unspecified: Secondary | ICD-10-CM

## 2018-10-04 DIAGNOSIS — F419 Anxiety disorder, unspecified: Secondary | ICD-10-CM

## 2018-10-04 MED ORDER — LORAZEPAM 0.5 MG PO TABS: 0.5000 mg | ORAL_TABLET | Freq: Three times a day (TID) | ORAL | 1 refills | Status: DC | PRN

## 2018-10-04 MED ORDER — ESCITALOPRAM OXALATE 10 MG PO TABS: 10.0000 mg | ORAL_TABLET | Freq: Every day | ORAL | 1 refills | Status: DC

## 2018-10-04 NOTE — Progress Notes (Signed)
Patient ID: Michelle GlasgowYesenia N Macias, female   DOB: Feb 12, 1990, 28 y.o.   MRN: [id number] .Marland Kitchen.Virtual Visit via Video Note  I connected with Michelle Macias on 10/04/18 at  8:50 AM EDT by a video enabled telemedicine application and verified that I am speaking with the correct person using two identifiers.  Location: Patient: home Provider: clinic   I discussed the limitations of evaluation and management by telemedicine and the availability of in person appointments. The patient expressed understanding and agreed to proceed.  History of Present Illness: Pt is a 28 yo female with hx of anxiety and depression who calls in to the clinic to discuss worsening anxiety, stress, panic. She and her husband are having a lot of trouble right now. They have been trying to have a child for 3 years and she feels like she is "broken". They have tried clomid, IUI, injections with no benefit. She has 3 children from another relationship and he is not nice to them because they are not "his". She has been dealing with this stress for a while but recently she has had episodes of not being able to stop crying, feeling trapped, trouble breathing. They resolve after about 20-2630minutes. She is working from home and kids are with her all day. She and husband tried counseling but her husband does not want to continue and just places blame on her. They are talking about divorce.  Argument with husband fills like she is trapped. She has having these acute episodes on average every other day. She admits her husband does get aggressive but she feels "safe". No SI/HC.   She has tried lexapro in the past and stated she remembered it helping but she stopped thinking she "did not need it".   .. Active Ambulatory Problems    Diagnosis Date Noted  . Pregnancy with history of pre-term labor 04/07/2011  . Supervision of high-risk pregnancy 04/13/2011  . Encounter for sterilization 12/08/2011  . Postoperative pain 12/11/2011  .  Incisional infection 12/11/2011  . History of hernia repair 04/24/2014  . Hx of umbilical hernia repair 04/24/2014  . RUQ abdominal tenderness 04/24/2014  . Umbilical pain 04/24/2014  . Abdominal tenderness, RLQ (right lower quadrant) 04/24/2014  . Constipation 04/30/2014  . 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 Vitamin D deficiency 05/30/2014  . Upper abdominal pain 06/25/2015  . Atypical chest pain 10/08/2015  . Shortness of breath 10/08/2015  . Cough 10/08/2015  . Generalized abdominal pain 01/02/2016  . Anxiety and depression 12/09/2016  . Outbursts of anger 12/09/2016  . Stress reaction 12/09/2016  . Abdominal pain, left lower quadrant 05/18/2017  . Bilateral hand pain 02/17/2018  . Possible pregnancy 02/17/2018  . Stress at home 10/04/2018  . Panic attacks 10/04/2018   Resolved Ambulatory Problems    Diagnosis Date Noted  . No Resolved Ambulatory Problems   Past Medical History:  Diagnosis Date  . Anemia   . Ectopic pregnancy   . H/O hernia repair   . Hx of thrombocytopenia   . Hypertension   . Preterm labor   . Tension headache    Reviewed med, allergy, problem.     Observations/Objective: No acute distress. Normal appearance.  Tearful mood.   .. Today's Vitals   10/04/18 0837  Weight: 148 lb (67.1 kg)  Height: 4\' 11"  (1.499 m)   Body mass index is 29.89 kg/m. .. [facility] 10/04/2018 12/09/2016 12/09/2016  Decreased Interest 3 1 1   Down, Depressed, Hopeless 1 2 2  PHQ - 2 Score 4 3 3   Altered sleeping 2 2 2   Tired, decreased energy 3 2 2   Change in appetite 3 1 1   Feeling bad or failure about yourself  3 2 2   Trouble concentrating 1 1 1   Moving slowly or fidgety/restless 3 2 2   Suicidal thoughts 0 1 1  PHQ-9 Score 19 14 14   Difficult doing work/chores Somewhat difficult Very difficult -   .. GAD 7 : Generalized Anxiety Score 10/04/2018 12/09/2016 12/09/2016  Nervous, Anxious, on Edge 3 2 2   Control/stop worrying 3 1  1   Worry too much - different things 3 2 2   Trouble relaxing 3 2 2   Restless 3 2 2   Easily annoyed or irritable 3 3 3   Afraid - awful might happen 1 1 1   Total GAD 7 Score 19 13 13   Anxiety Difficulty Somewhat difficult - -       Assessment and Plan: Marland KitchenMarland KitchenTaquita was seen today for anxiety.  Diagnoses and all orders for this visit:  Stress at home -     escitalopram (LEXAPRO) 10 MG tablet; Take 1 tablet (10 mg total) by mouth daily. -     LORazepam (ATIVAN) 0.5 MG tablet; Take 1 tablet (0.5 mg total) by mouth every 8 (eight) hours as needed for anxiety.  Stress reaction -     escitalopram (LEXAPRO) 10 MG tablet; Take 1 tablet (10 mg total) by mouth daily. -     LORazepam (ATIVAN) 0.5 MG tablet; Take 1 tablet (0.5 mg total) by mouth every 8 (eight) hours as needed for anxiety.  Outbursts of anger -     escitalopram (LEXAPRO) 10 MG tablet; Take 1 tablet (10 mg total) by mouth daily.  Anxiety and depression -     escitalopram (LEXAPRO) 10 MG tablet; Take 1 tablet (10 mg total) by mouth daily. -     LORazepam (ATIVAN) 0.5 MG tablet; Take 1 tablet (0.5 mg total) by mouth every 8 (eight) hours as needed for anxiety.  Panic attacks -     escitalopram (LEXAPRO) 10 MG tablet; Take 1 tablet (10 mg total) by mouth daily. -     LORazepam (ATIVAN) 0.5 MG tablet; Take 1 tablet (0.5 mg total) by mouth every 8 (eight) hours as needed for anxiety.   Discussed mood. I would strongly consider staying in counseling to help her process decisions that need to be made in future. Given as needed ativan. Discussed to use sparingly due to risk of dependency. Started lexapro again. Follow up in 6-8 weeks.     Follow Up Instructions:    I discussed the assessment and treatment plan with the patient. The patient was provided an opportunity to ask questions and all were answered. The patient agreed with the plan and demonstrated an understanding of the instructions.   The patient was advised to call  back or seek an in-person evaluation if the symptoms worsen or if the condition fails to improve as anticipated.    Iran Planas, PA-C

## 2018-10-04 NOTE — Progress Notes (Signed)
Increased anxiety. Has had a couple episodes, possible panic attacks.  PHQ9-GAD7 completed.

## 2018-10-05 ENCOUNTER — Telehealth: Payer: Self-pay

## 2018-10-05 NOTE — Telephone Encounter (Signed)
Covid-19 screening questions   Do you now or have you had a fever in the last 14 days? NO   Do you have any respiratory symptoms of shortness of breath or cough now or in the last 14 days? NO  Do you have any family members or close contacts with diagnosed or suspected Covid-19 in the past 14 days? NO  Have you been tested for Covid-19 and found to be positive? NO        

## 2018-10-06 ENCOUNTER — Other Ambulatory Visit: Payer: Self-pay

## 2018-10-06 ENCOUNTER — Encounter: Payer: Self-pay | Admitting: Gastroenterology

## 2018-10-06 ENCOUNTER — Encounter: Payer: Self-pay | Admitting: Internal Medicine

## 2018-10-06 ENCOUNTER — Ambulatory Visit (AMBULATORY_SURGERY_CENTER): Payer: Self-pay | Admitting: Internal Medicine

## 2018-10-06 ENCOUNTER — Other Ambulatory Visit: Payer: Self-pay | Admitting: Internal Medicine

## 2018-10-06 VITALS — BP 116/83 | HR 82 | Temp 98.6°F | Resp 19 | Ht 59.0 in | Wt 147.0 lb

## 2018-10-06 DIAGNOSIS — K259 Gastric ulcer, unspecified as acute or chronic, without hemorrhage or perforation: Secondary | ICD-10-CM

## 2018-10-06 DIAGNOSIS — K3189 Other diseases of stomach and duodenum: Secondary | ICD-10-CM

## 2018-10-06 DIAGNOSIS — R110 Nausea: Secondary | ICD-10-CM

## 2018-10-06 DIAGNOSIS — R1031 Right lower quadrant pain: Secondary | ICD-10-CM

## 2018-10-06 MED ORDER — PANTOPRAZOLE SODIUM 20 MG PO TBEC: 20.0000 mg | DELAYED_RELEASE_TABLET | Freq: Every day | ORAL | 0 refills | Status: DC

## 2018-10-06 MED ORDER — SODIUM CHLORIDE 0.9 % IV SOLN: 500.0000 mL | Freq: Once | INTRAVENOUS | Status: DC

## 2018-10-06 NOTE — Patient Instructions (Addendum)
I found a small stomach ulcer - don't know how much that is responsible for your symptoms but will treat with pantoprazole and see what that does. It will heal the ulcer.  Sometimes ulcers come from aspirin, ibuprofen, aleve, Goody's etc. If taking these should stop.  I took biopsies of the ulcer to see what might be causing it - ? Infection. We will let you know.  The colonoscopy was normal.  Once we have biopsy results will figure out next steps.  I appreciate the opportunity to care for you. Gatha Mayer, MD, FACG   YOU HAD AN ENDOSCOPIC PROCEDURE TODAY AT [facility]:   Refer to the procedure report that was given to you for any specific questions about what was found during the examination.  If the procedure report does not answer your questions, please call your gastroenterologist to clarify.  If you requested that your care partner not be given the details of your procedure findings, then the procedure report has been included in a sealed envelope for you to review at your convenience later.  YOU SHOULD EXPECT: Some feelings of bloating in the abdomen. Passage of more gas than usual.  Walking can help get rid of the air that was put into your GI tract during the procedure and reduce the bloating. If you had a lower endoscopy (such as a colonoscopy or flexible sigmoidoscopy) you may notice spotting of blood in your stool or on the toilet paper. If you underwent a bowel prep for your procedure, you may not have a normal bowel movement for a few days.  Please Note:  You might notice some irritation and congestion in your nose or some drainage.  This is from the oxygen used during your procedure.  There is no need for concern and it should clear up in a day or so.  SYMPTOMS TO REPORT IMMEDIATELY:   Following lower endoscopy (colonoscopy or flexible sigmoidoscopy):  Excessive amounts of blood in the stool  Significant tenderness or worsening of abdominal  pains  Swelling of the abdomen that is new, acute  Fever of 100F or higher   Following upper endoscopy (EGD)  Vomiting of blood or coffee ground material  New chest pain or pain under the shoulder blades  Painful or persistently difficult swallowing  New shortness of breath  Fever of 100F or higher  Black, tarry-looking stools  For urgent or emergent issues, a gastroenterologist can be reached at any hour by calling [phone].   DIET:  We do recommend a small meal at first, but then you may proceed to your regular diet.  Drink plenty of fluids but you should avoid alcoholic beverages for 24 hours.  ACTIVITY:  You should plan to take it easy for the rest of today and you should NOT DRIVE or use heavy machinery until tomorrow (because of the sedation medicines used during the test).    FOLLOW UP: Our staff will call the number listed on your records 48-72 hours following your procedure to check on you and address any questions or concerns that you may have regarding the information given to you following your procedure. If we do not reach you, we will leave a message.  We will attempt to reach you two times.  During this call, we will ask if you have developed any symptoms of COVID 19. If you develop any symptoms (ie: fever, flu-like symptoms, shortness of breath, cough etc.) before then, please call [phone].  If you test  positive for Covid 19 in the 2 weeks post procedure, please call and report this information to us.    If any biopsies were taken you will be contacted by phone or by letter within the next 1-3 weeks.  Please call us at [phone] if you have not heard about the biopsies in 3 weeks.    SIGNATURES/CONFIDENTIALITY: You and/or your care partner have signed paperwork which will be entered into your electronic medical record.  These signatures attest to the fact that that the information above on your After Visit Summary has been reviewed and is understood.   Full responsibility of the confidentiality of this discharge information lies with you and/or your care-partner.  Read all handouts given to you by your recovery room nurse today.Rollene Fare. Thank-you for choosing us for your medical care today.

## 2018-10-06 NOTE — Op Note (Signed)
[facility] Patient Name: Michelle PolkaYesenia Macias Procedure Date: 10/06/2018 2:57 PM MRN: [id number] Endoscopist: Iva Booparl E Gessner , MD Age: 28 Referring MD:  Date of Birth: Feb 08, 1990 Gender: Female Account #: [id number] Procedure:                Colonoscopy Indications:              Abdominal pain in the right lower quadrant Medicines:                Propofol per Anesthesia, Monitored Anesthesia Care Procedure:                Pre-Anesthesia Assessment:                           - Prior to the procedure, a History and Physical                            was performed, and patient medications and                            allergies were reviewed. The patient's tolerance of                            previous anesthesia was also reviewed. The risks                            and benefits of the procedure and the sedation                            options and risks were discussed with the patient.                            All questions were answered, and informed consent                            was obtained. Prior Anticoagulants: The patient has                            taken no previous anticoagulant or antiplatelet                            agents. ASA Grade Assessment: II - A patient with                            mild systemic disease. After reviewing the risks                            and benefits, the patient was deemed in                            satisfactory condition to undergo the procedure.                           After obtaining informed consent, the colonoscope  was passed under direct vision. Throughout the                            procedure, the patient's blood pressure, pulse, and                            oxygen saturations were monitored continuously. The                            Colonoscope was introduced through the anus and                            advanced to the the terminal ileum, with                             identification of the appendiceal orifice and IC                            valve. The quality of the bowel preparation was                            adequate. The colonoscopy was performed without                            difficulty. The patient tolerated the procedure                            well. The bowel preparation used was Clenpiq via                            split dose instruction. Scope In: 3:18:25 PM Scope Out: 3:27:21 PM Scope Withdrawal Time: 0 hours 7 minutes 17 seconds  Total Procedure Duration: 0 hours 8 minutes 56 seconds  Findings:                 The perianal and digital rectal examinations were                            normal.                           The colon (entire examined portion) appeared normal.                           No additional abnormalities were found on                            retroflexion.                           The terminal ileum appeared normal. Complications:            No immediate complications. Estimated blood loss:                            None. Estimated Blood Loss:  Estimated blood loss: none. Impression:               - The entire examined colon is normal.                           - The examined portion of the ileum was normal.                           - No specimens collected. Recommendation:           - Patient has a contact number available for                            emergencies. The signs and symptoms of potential                            delayed complications were discussed with the                            patient. Return to normal activities tomorrow.                            Written discharge instructions were provided to the                            patient.                           - Resume previous diet.                           - Continue present medications.                           - No recommendation at this time regarding repeat                            colonoscopy due to young  age.                           - See the other procedure note for documentation of                            additional recommendations. Gatha Mayer, MD 10/06/2018 3:39:30 PM This report has been signed electronically.

## 2018-10-06 NOTE — Progress Notes (Signed)
PT taken to PACU. Monitors in place. VSS. Report given to RN. 

## 2018-10-06 NOTE — Op Note (Addendum)
[facility] Patient Name: Michelle Macias Procedure Date: 10/06/2018 3:02 PM MRN: [id number] Endoscopist: Iva Booparl E Aundraya Dripps , MD Age: [age] Referring MD:  Date of Birth: Jan 30, 1990 Gender: Female Account #: [id number] Procedure:                Upper GI endoscopy Indications:              Abdominal pain in the right lower quadrant, Nausea Medicines:                Propofol per Anesthesia, Monitored Anesthesia Care Procedure:                Pre-Anesthesia Assessment:                           - Prior to the procedure, a History and Physical                            was performed, and patient medications and                            allergies were reviewed. The patient's tolerance of                            previous anesthesia was also reviewed. The risks                            and benefits of the procedure and the sedation                            options and risks were discussed with the patient.                            All questions were answered, and informed consent                            was obtained. Prior Anticoagulants: The patient has                            taken no previous anticoagulant or antiplatelet                            agents. ASA Grade Assessment: II - A patient with                            mild systemic disease. After reviewing the risks                            and benefits, the patient was deemed in                            satisfactory condition to undergo the procedure.                           After obtaining informed consent, the endoscope was  passed under direct vision. Throughout the                            procedure, the patient's blood pressure, pulse, and                            oxygen saturations were monitored continuously. The                            Endoscope was introduced through the mouth, and                            advanced to the second part of duodenum. The upper                             GI endoscopy was accomplished without difficulty.                            The patient tolerated the procedure well. Scope In: Scope Out: Findings:                 One non-bleeding cratered gastric ulcer with no                            stigmata of bleeding was found in the prepyloric                            region of the stomach. The lesion was 5 mm in                            largest dimension. Biopsies were taken with a cold                            forceps for histology. Verification of patient                            identification for the specimen was done. Estimated                            blood loss was minimal.                           The exam was otherwise without abnormality.                           The cardia and gastric fundus were normal on                            retroflexion. Complications:            No immediate complications. Estimated Blood Loss:     Estimated blood loss was minimal. Impression:               - Non-bleeding gastric ulcer with no stigmata of  bleeding. Biopsied.                           - The examination was otherwise normal. Recommendation:           - Patient has a contact number available for                            emergencies. The signs and symptoms of potential                            delayed complications were discussed with the                            patient. Return to normal activities tomorrow.                            Written discharge instructions were provided to the                            patient.                           - Resume previous diet.                           - Continue present medications.                           - Await pathology results.                           - Use Protonix (pantoprazole) 20 mg PO daily.                           has chronic headaches and told me in recovery daily                            NSAID/salicylate  use                           counselled to reduce/stop that                           will need help with chronic headaches from PCP Gatha Mayer, MD 10/06/2018 3:36:42 PM This report has been signed electronically.

## 2018-10-06 NOTE — Progress Notes (Signed)
Called to room to assist during endoscopic procedure.  Patient ID and intended procedure confirmed with present staff. Received instructions for my participation in the procedure from the performing physician.  

## 2018-10-10 ENCOUNTER — Telehealth: Payer: Self-pay | Admitting: *Deleted

## 2018-10-10 ENCOUNTER — Telehealth: Payer: Self-pay

## 2018-10-10 NOTE — Telephone Encounter (Signed)
Left message on f/u call 

## 2018-10-10 NOTE — Telephone Encounter (Signed)
Second post procedure follow up call, no answer 

## 2018-10-19 NOTE — Progress Notes (Signed)
Jade,   Can we please get her appt to discuss headaches.  Thanks.

## 2018-10-26 ENCOUNTER — Other Ambulatory Visit: Payer: Self-pay | Admitting: Physician Assistant

## 2018-10-26 DIAGNOSIS — R454 Irritability and anger: Secondary | ICD-10-CM

## 2018-10-26 DIAGNOSIS — F439 Reaction to severe stress, unspecified: Secondary | ICD-10-CM

## 2018-10-26 DIAGNOSIS — F430 Acute stress reaction: Secondary | ICD-10-CM

## 2018-10-26 DIAGNOSIS — F419 Anxiety disorder, unspecified: Secondary | ICD-10-CM

## 2018-10-26 DIAGNOSIS — F329 Major depressive disorder, single episode, unspecified: Secondary | ICD-10-CM

## 2018-10-26 DIAGNOSIS — F410 Panic disorder [episodic paroxysmal anxiety] without agoraphobia: Secondary | ICD-10-CM

## 2018-10-31 ENCOUNTER — Telehealth: Payer: Self-pay | Admitting: Physician Assistant

## 2018-11-28 ENCOUNTER — Ambulatory Visit: Payer: Self-pay | Admitting: Gastroenterology

## 2019-01-17 ENCOUNTER — Other Ambulatory Visit: Payer: Self-pay | Admitting: Physician Assistant

## 2019-01-17 DIAGNOSIS — F419 Anxiety disorder, unspecified: Secondary | ICD-10-CM

## 2019-01-17 DIAGNOSIS — F439 Reaction to severe stress, unspecified: Secondary | ICD-10-CM

## 2019-01-17 DIAGNOSIS — F430 Acute stress reaction: Secondary | ICD-10-CM

## 2019-01-17 DIAGNOSIS — F410 Panic disorder [episodic paroxysmal anxiety] without agoraphobia: Secondary | ICD-10-CM

## 2019-01-17 DIAGNOSIS — R454 Irritability and anger: Secondary | ICD-10-CM

## 2019-01-17 DIAGNOSIS — F329 Major depressive disorder, single episode, unspecified: Secondary | ICD-10-CM

## 2019-01-17 NOTE — Telephone Encounter (Signed)
Must make appointment 

## 2019-02-22 ENCOUNTER — Telehealth (INDEPENDENT_AMBULATORY_CARE_PROVIDER_SITE_OTHER): Payer: Self-pay | Admitting: Physician Assistant

## 2019-02-22 ENCOUNTER — Encounter: Payer: Self-pay | Admitting: Physician Assistant

## 2019-02-22 VITALS — Ht 59.0 in | Wt 140.0 lb

## 2019-02-22 DIAGNOSIS — F410 Panic disorder [episodic paroxysmal anxiety] without agoraphobia: Secondary | ICD-10-CM

## 2019-02-22 DIAGNOSIS — F329 Major depressive disorder, single episode, unspecified: Secondary | ICD-10-CM

## 2019-02-22 DIAGNOSIS — K253 Acute gastric ulcer without hemorrhage or perforation: Secondary | ICD-10-CM

## 2019-02-22 DIAGNOSIS — F430 Acute stress reaction: Secondary | ICD-10-CM

## 2019-02-22 DIAGNOSIS — F439 Reaction to severe stress, unspecified: Secondary | ICD-10-CM

## 2019-02-22 DIAGNOSIS — G43009 Migraine without aura, not intractable, without status migrainosus: Secondary | ICD-10-CM | POA: Insufficient documentation

## 2019-02-22 DIAGNOSIS — R454 Irritability and anger: Secondary | ICD-10-CM

## 2019-02-22 DIAGNOSIS — F419 Anxiety disorder, unspecified: Secondary | ICD-10-CM

## 2019-02-22 MED ORDER — TOPIRAMATE 50 MG PO TABS: ORAL_TABLET | ORAL | 0 refills | Status: DC

## 2019-02-22 MED ORDER — TOPIRAMATE 50 MG PO TABS: 50.0000 mg | ORAL_TABLET | Freq: Two times a day (BID) | ORAL | 2 refills | Status: DC

## 2019-02-22 MED ORDER — ONDANSETRON HCL 8 MG PO TABS: 8.0000 mg | ORAL_TABLET | Freq: Three times a day (TID) | ORAL | 0 refills | Status: DC | PRN

## 2019-02-22 MED ORDER — ESCITALOPRAM OXALATE 10 MG PO TABS: 10.0000 mg | ORAL_TABLET | Freq: Every day | ORAL | 1 refills | Status: DC

## 2019-02-22 MED ORDER — SUMATRIPTAN SUCCINATE 100 MG PO TABS: 100.0000 mg | ORAL_TABLET | ORAL | 0 refills | Status: DC | PRN

## 2019-02-22 NOTE — Progress Notes (Signed)
Had migraines since age [age]. More consistent and much worse now, causing nausea, vomiting, light sensitivity, no aura.   Currently 3 migraine days a week (previously has 1-2 a month). Noticed an increase in last month. Unable to identify triggers. Often wakes up with headaches. Takes extra strength tylenol or goody powders (had to stop due to ulcers). Left side   LMP 02/12/19, no connection between this and migraines. Never been on migraine treatment nor preventative.

## 2019-02-22 NOTE — Patient Instructions (Signed)
Recurrent Migraine Headache A migraine headache is very bad, throbbing pain that is usually on one side of your head. Recurrent migraines keep coming back (recurring). Talk with your doctor about what things may bring on (trigger) your migraine headaches. Follow these instructions at home: Medicines  Take over-the-counter and prescription medicines only as told by your doctor.  Do not drive or use heavy machinery while taking prescription pain medicine. Lifestyle  Do not use any products that contain nicotine or tobacco, such as cigarettes and e-cigarettes. If you need help quitting, ask your doctor.  Limit alcohol intake to no more than 1 drink a day for nonpregnant women and 2 drinks a day for men. One drink equals 12 oz of beer, 5 oz of wine, or 1 oz of hard liquor.  Get 7-9 hours of sleep each night.  Lessen any stress in your life. Ask your doctor about ways to lower your stress.  Stay at a healthy weight. Talk with your doctor if you need help losing weight.  Get regular exercise. General instructions   Keep a journal to find out if certain things bring on migraine headaches. For example, write down: ? What you eat and drink. ? How much sleep you get. ? Any change to your diet or medicines.  Lie down in a dark, quiet room when you have a migraine.  Try placing a cool towel over your head when you have a migraine.  Keep lights dim if bright lights bother you or make your migraines worse.  Keep all follow-up visits as told by your doctor. This is important. Contact a doctor if:  Medicine does not help your migraines.  Your pain keeps coming back.  You have a fever.  You have weight loss without trying. Get help right away if:  Your migraine becomes really bad and medicine does not help.  You have a stiff neck.  You have trouble seeing.  Your muscles are weak or you lose control of your muscles.  You lose your balance or have trouble walking.  You feel  like you will pass out (faint) or you pass out.  You have really bad symptoms that are different than your first symptoms.  You start having sudden, very bad headaches that last for one second or less, like a thunderclap. Summary  A migraine headache is very bad, throbbing pain that is usually on one side of your head.  Talk with your doctor about what things may bring on (trigger) your migraine headaches.  Take over-the-counter and prescription medicines only as told by your doctor.  Lie down in a dark, quiet room when you have a migraine.  Keep a journal about what you eat and drink, how much sleep you get, and any changes to your medicines. This can help you find out if certain things make you have migraine headaches. This information is not intended to replace advice given to you by your health care provider. Make sure you discuss any questions you have with your health care provider. Document Revised: 01/08/2017 Document Reviewed: 11/29/2015 Elsevier Patient Education  2020 [employer]vier Inc.  

## 2019-02-27 ENCOUNTER — Encounter: Payer: Self-pay | Admitting: Physician Assistant

## 2019-02-27 DIAGNOSIS — K259 Gastric ulcer, unspecified as acute or chronic, without hemorrhage or perforation: Secondary | ICD-10-CM | POA: Insufficient documentation

## 2019-02-27 NOTE — Progress Notes (Signed)
Patient ID: Michelle Macias, female   DOB: [birth year], 29 y.o.   MRN: [id number] .Marland KitchenVirtual Visit via Video Note  I connected with Michelle Macias on 02/22/2019 at  7:50 AM EST by a video enabled telemedicine application and verified that I am speaking with the correct person using two identifiers.  Location: Patient: home Provider: clinic   I discussed the limitations of evaluation and management by telemedicine and the availability of in person appointments. The patient expressed understanding and agreed to proceed.  History of Present Illness: Pt is a 29 yo female with anger outburts, anxiety, depression and migraines who calls into the clinic to discuss migraines.   Pt has had migraines since 14 or 15. She used to be able to sleep the off. Seems to becoming more frequent. She is having 3 migraine days a week opposed to 1-2 a month. She takes tylenol but had to stop goodys powder due to gastric ulcers. Her migraines are left sided and denies any auras. She does get nauseated but no vomiting. Does not have a rescue. No assoicated with period. Never tried preventative.   .. Active Ambulatory Problems    Diagnosis Date Noted  . Pregnancy with history of pre-term labor 04/07/2011  . Supervision of high-risk pregnancy 04/13/2011  . Encounter for sterilization 12/08/2011  . Postoperative pain 12/11/2011  . Incisional infection 12/11/2011  . History of hernia repair 04/24/2014  . Hx of umbilical hernia repair 94/17/4081  . RUQ abdominal tenderness 04/24/2014  . Umbilical pain 44/81/8563  . Abdominal tenderness, RLQ (right lower quadrant) 04/24/2014  . Constipation 04/30/2014  . 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 Vitamin D deficiency 05/30/2014  . Upper abdominal pain 06/25/2015  . Atypical chest pain 10/08/2015  . Shortness of breath 10/08/2015  . Cough 10/08/2015  . Generalized abdominal pain 01/02/2016  . Anxiety and depression 12/09/2016  . Outbursts of anger  12/09/2016  . Stress reaction 12/09/2016  . Abdominal pain, left lower quadrant 05/18/2017  . Bilateral hand pain 02/17/2018  . Possible pregnancy 02/17/2018  . Stress at home 10/04/2018  . Panic attacks 10/04/2018  . Migraine without aura and without status migrainosus, not intractable 02/22/2019   Resolved Ambulatory Problems    Diagnosis Date Noted  . No Resolved Ambulatory Problems   Past Medical History:  Diagnosis Date  . Anemia   . Ectopic pregnancy   . H/O hernia repair   . Hx of thrombocytopenia   . Hypertension   . Preterm labor   . Tension headache    Reviewed med, allergy, problem list.     Observations/Objective: No acute distress.  Normal mood and appearance.   .. Today's Vitals   02/22/19 0720  Weight: 140 lb (63.5 kg)  Height: 4\' 11"  (1.499 m)   Body mass index is 28.28 kg/m.    Assessment and Plan: Marland KitchenMarland KitchenLalaine was seen today for headache.  Diagnoses and all orders for this visit:  Migraine without aura and without status migrainosus, not intractable -     ondansetron (ZOFRAN) 8 MG tablet; Take 1 tablet (8 mg total) by mouth every 8 (eight) hours as needed for nausea or vomiting. -     SUMAtriptan (IMITREX) 100 MG tablet; Take 1 tablet (100 mg total) by mouth every 2 (two) hours as needed for migraine. May repeat in 2 hours if headache persists or recurs. -     topiramate (TOPAMAX) 50 MG tablet; Start 1/2 tablet for 7 days at night then increase to 1  full tablet at night for 7 days then increase to 1 tablet in morning and at night. -     topiramate (TOPAMAX) 50 MG tablet; Take 1 tablet (50 mg total) by mouth 2 (two) times daily.  Stress reaction -     escitalopram (LEXAPRO) 10 MG tablet; Take 1 tablet (10 mg total) by mouth daily.  Outbursts of anger -     escitalopram (LEXAPRO) 10 MG tablet; Take 1 tablet (10 mg total) by mouth daily.  Anxiety and depression -     escitalopram (LEXAPRO) 10 MG tablet; Take 1 tablet (10 mg total) by mouth  daily.  Stress at home -     escitalopram (LEXAPRO) 10 MG tablet; Take 1 tablet (10 mg total) by mouth daily.  Panic attacks -     escitalopram (LEXAPRO) 10 MG tablet; Take 1 tablet (10 mg total) by mouth daily.   Pt does not have insurance. Start with topamax preventative. Titrate up to therapeutic dose. Given rescue imitrix. Discussed SE of medication. Follow up in 2 months sooner if no relief or problems with medication.   Refilled lexparo.    Follow Up Instructions:    I discussed the assessment and treatment plan with the patient. The patient was provided an opportunity to ask questions and all were answered. The patient agreed with the plan and demonstrated an understanding of the instructions.   The patient was advised to call back or seek an in-person evaluation if the symptoms worsen or if the condition fails to improve as anticipated.   Tandy Gaw, PA-C

## 2019-03-08 ENCOUNTER — Other Ambulatory Visit: Payer: Self-pay | Admitting: Physician Assistant

## 2019-03-08 DIAGNOSIS — G43009 Migraine without aura, not intractable, without status migrainosus: Secondary | ICD-10-CM

## 2019-03-10 ENCOUNTER — Encounter: Payer: Self-pay | Admitting: Physician Assistant

## 2019-03-10 MED ORDER — BACLOFEN 10 MG PO TABS: 10.0000 mg | ORAL_TABLET | Freq: Every day | ORAL | 0 refills | Status: DC

## 2019-03-17 IMAGING — US US TRANSVAGINAL NON-OB
1 series · 13 of 25 positions shown · non-contrast
Comparison: None

CLINICAL DATA: Right lower quadrant pain and urinary frequency for
2 days.



[Series 1: us transvaginal non-ob · 0.17mm/px · 13 of 123 slices shown]
[im 1/123]
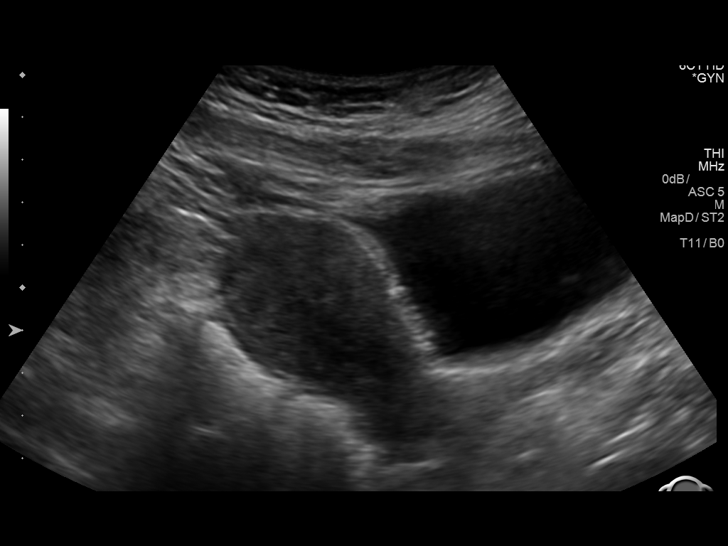
[im 11/123]
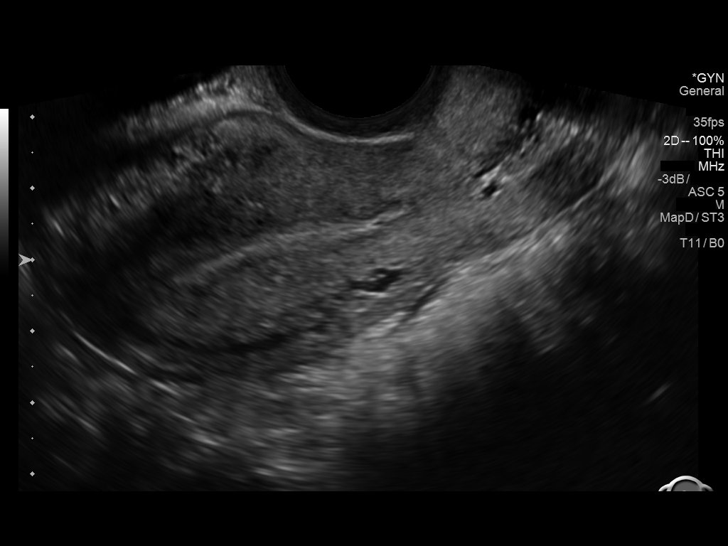
[im 21/123]
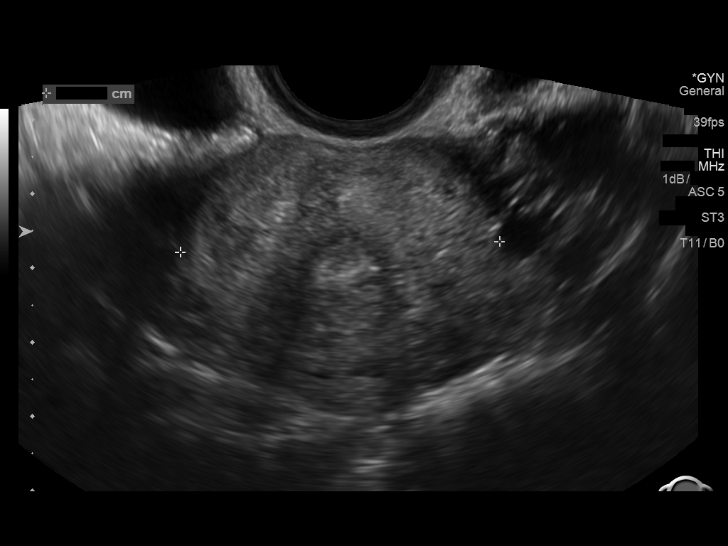
[im 31/123]
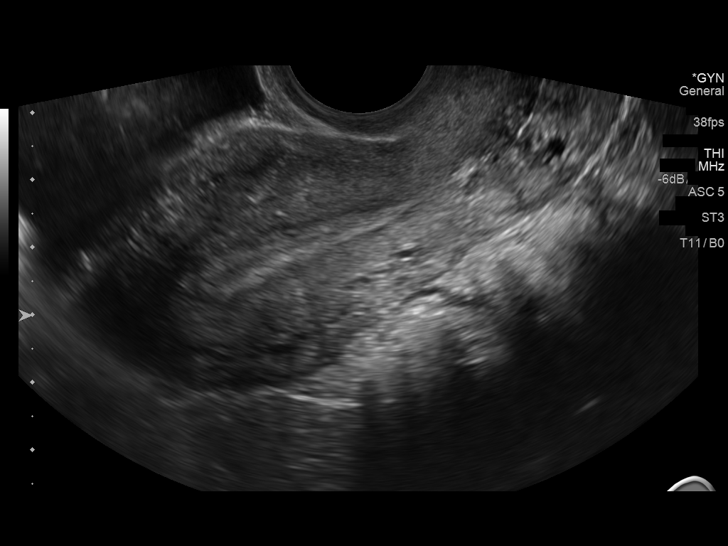
[im 41/123]
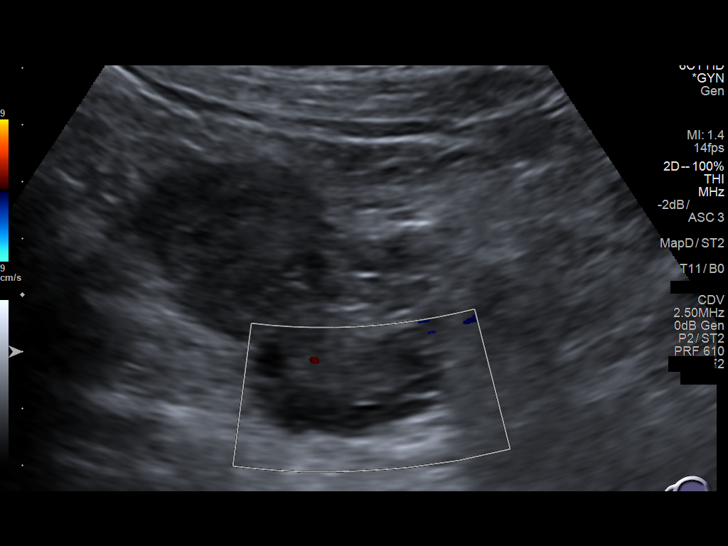
[im 51/123]
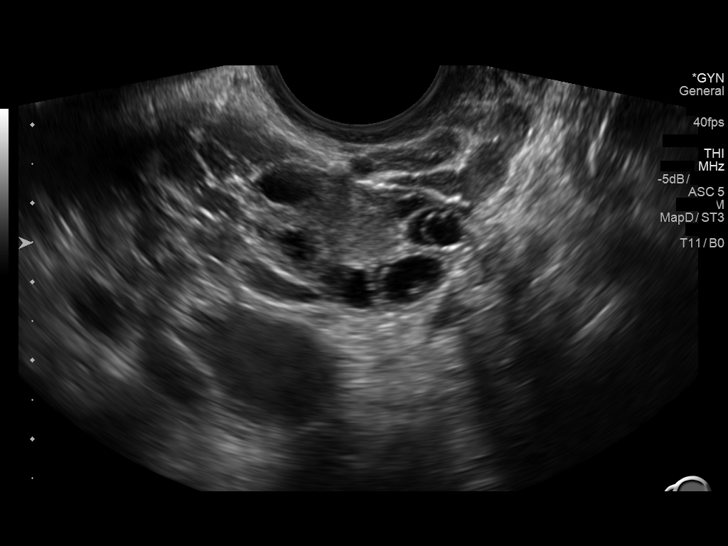
[im 62/123]
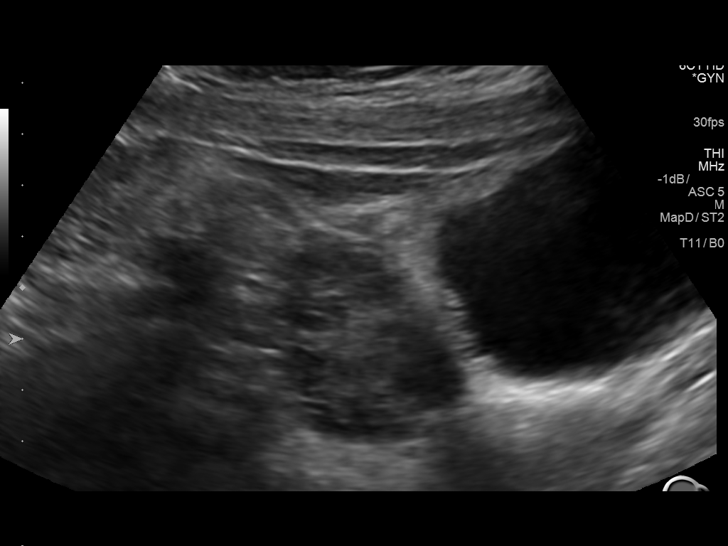
[im 72/123]
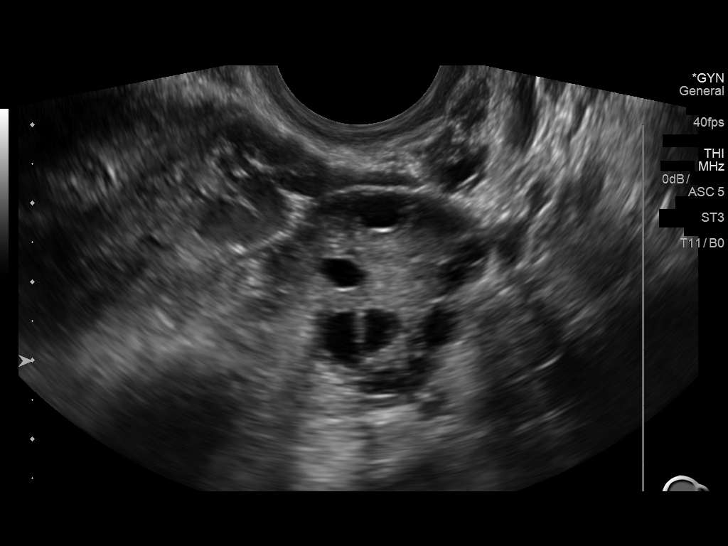
[im 82/123]
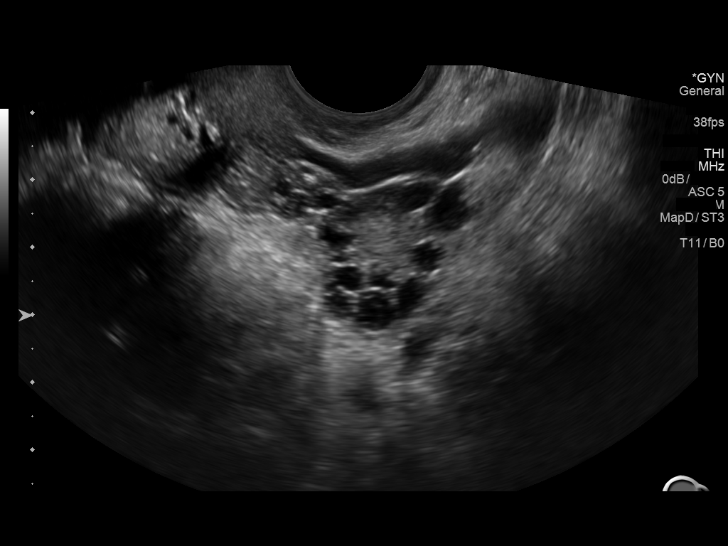
[im 92/123]
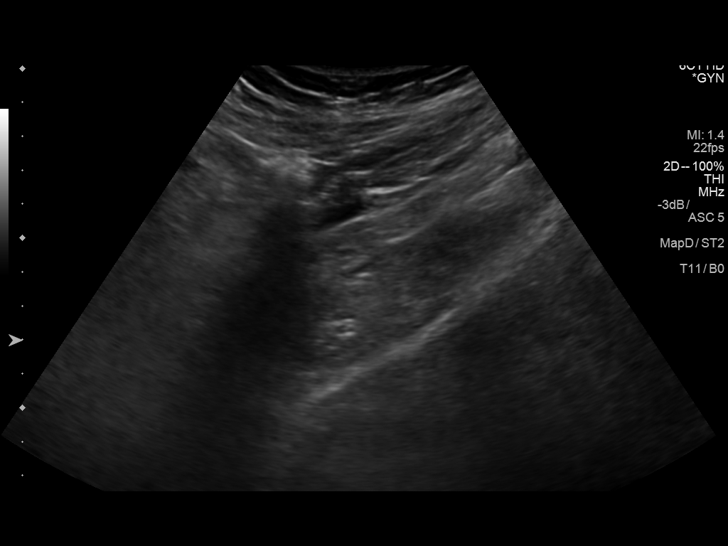
[im 102/123]
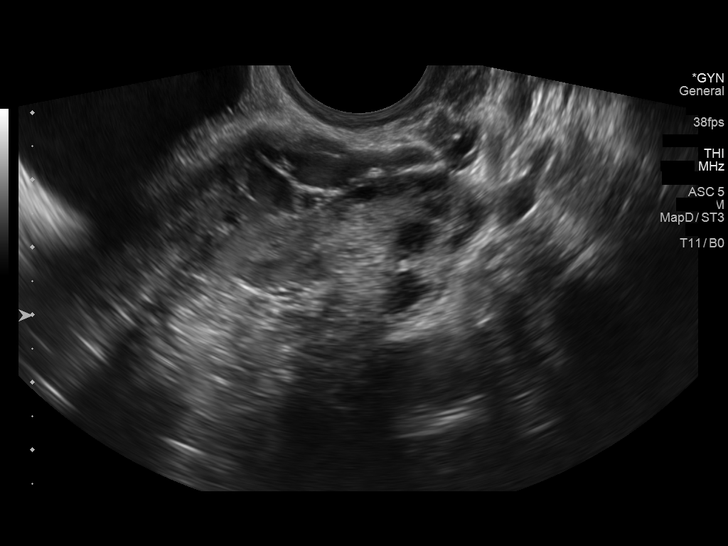
[im 112/123]
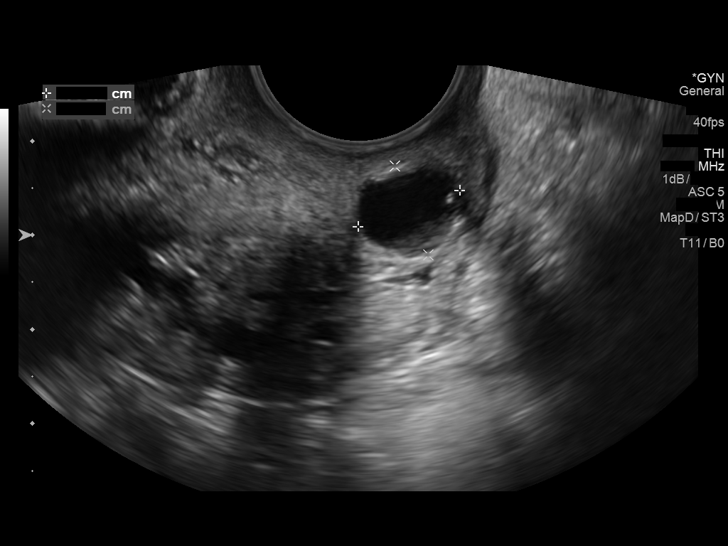
[im 123/123]
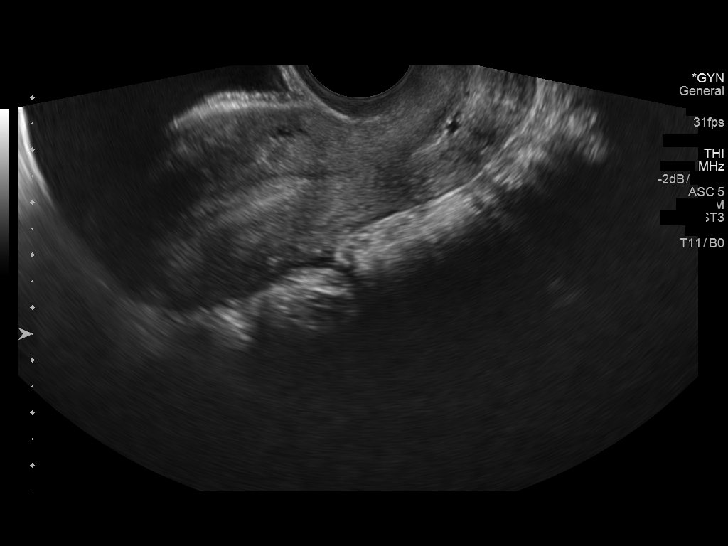

[13 of 25 positions shown; findings below may reference images not displayed]

FINDINGS: Uterus

Measurements: 7.5 x 3.6 x 4.3 cm. No fibroids or other mass
visualized.

Endometrium

Thickness: 0.7 cm.  No focal abnormality visualized.

Right ovary

Measurements: 2.8 x 1.5 x 1.9 cm. No adnexal mass. Although it is
not possible to definitively assess the number of follicles, the
patient appears to have at least 12 follicles arranged about the
periphery of the ovary.

Left ovary

Measurements: 2.8 x 2.3 x 2.4 cm. Normal appearance/no adnexal mass.
Although it is not possible to definitively assess the number of
follicles, the patient appears to have at least 12 follicles
arranged about the periphery of the ovary.

Other findings

No abnormal free fluid.
IMPRESSION: No acute abnormality.

Appearance of the ovaries suggestive of polycystic change.

## 2019-04-03 ENCOUNTER — Other Ambulatory Visit: Payer: Self-pay | Admitting: Physician Assistant

## 2019-04-17 ENCOUNTER — Other Ambulatory Visit: Payer: Self-pay | Admitting: Physician Assistant

## 2019-04-24 ENCOUNTER — Telehealth (INDEPENDENT_AMBULATORY_CARE_PROVIDER_SITE_OTHER): Payer: Self-pay | Admitting: Physician Assistant

## 2019-04-24 VITALS — Ht 59.0 in | Wt 150.0 lb

## 2019-04-24 DIAGNOSIS — R1013 Epigastric pain: Secondary | ICD-10-CM

## 2019-04-24 DIAGNOSIS — K219 Gastro-esophageal reflux disease without esophagitis: Secondary | ICD-10-CM

## 2019-04-24 DIAGNOSIS — R112 Nausea with vomiting, unspecified: Secondary | ICD-10-CM

## 2019-04-24 DIAGNOSIS — R1110 Vomiting, unspecified: Secondary | ICD-10-CM | POA: Insufficient documentation

## 2019-04-24 DIAGNOSIS — R140 Abdominal distension (gaseous): Secondary | ICD-10-CM | POA: Insufficient documentation

## 2019-04-24 DIAGNOSIS — R197 Diarrhea, unspecified: Secondary | ICD-10-CM | POA: Insufficient documentation

## 2019-04-24 DIAGNOSIS — R1011 Right upper quadrant pain: Secondary | ICD-10-CM

## 2019-04-24 MED ORDER — SUCRALFATE 1 G PO TABS: 1.0000 g | ORAL_TABLET | Freq: Three times a day (TID) | ORAL | 0 refills | Status: DC

## 2019-04-24 NOTE — Progress Notes (Signed)
Past 2 weeks: Diarrhea on/off Vomiting - most foods, no certain foods, all foods Abdominal pain - "feels like I've done crunches", more on right side than left, sometimes both Bloating Acid reflux

## 2019-04-25 ENCOUNTER — Encounter: Payer: Self-pay | Admitting: Physician Assistant

## 2019-04-25 NOTE — Progress Notes (Signed)
Patient ID: Michelle Macias, female   DOB: 07-23-1990, 29 y.o.   MRN: [id number] .Marland KitchenVirtual Visit via Video Note  I connected with Normajean Glasgow on 04/24/2019 at  8:10 AM EDT by a video enabled telemedicine application and verified that I am speaking with the correct person using two identifiers.  Location: Patient: car Provider: clinic   I discussed the limitations of evaluation and management by telemedicine and the availability of in person appointments. The patient expressed understanding and agreed to proceed.  History of Present Illness: Pt is a 29 yo female who calls into the clinic with 2 weeks of diarrhea, vomiting, acid reflux, abdominal pain and bloating. She denies any other sick contacts. She orginally thought it was a salad she had eaten but now symptoms will not go away. She is having 3-6 loose stools a day. Denies any melena or hematochezia. She is vomiting once or twice a day after eating.  She has generalized abdominal pain and cramping but right greater than left. She does notice that food makes worse. She has a lot of acid reflux. She started OTC omeprazole but not really helping. No fever, chills, SOB, headache. No new medications. No increase in alcohol use.    .. Active Ambulatory Problems    Diagnosis Date Noted  . Pregnancy with history of pre-term labor 04/07/2011  . Supervision of high-risk pregnancy 04/13/2011  . Encounter for sterilization 12/08/2011  . Postoperative pain 12/11/2011  . Incisional infection 12/11/2011  . History of hernia repair 04/24/2014  . Hx of umbilical hernia repair 04/24/2014  . RUQ abdominal tenderness 04/24/2014  . Umbilical pain 04/24/2014  . Abdominal tenderness, RLQ (right lower quadrant) 04/24/2014  . Constipation 04/30/2014  . 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 Vitamin D deficiency 05/30/2014  . Right upper quadrant abdominal pain 06/25/2015  . Atypical chest pain 10/08/2015  . Shortness of breath 10/08/2015   . Cough 10/08/2015  . Generalized abdominal pain 01/02/2016  . Anxiety and depression 12/09/2016  . Outbursts of anger 12/09/2016  . Stress reaction 12/09/2016  . Abdominal pain, left lower quadrant 05/18/2017  . Bilateral hand pain 02/17/2018  . Possible pregnancy 02/17/2018  . Stress at home 10/04/2018  . Panic attacks 10/04/2018  . Migraine without aura and without status migrainosus, not intractable 02/22/2019  . Gastric ulcer 02/27/2019  . Bloating 04/24/2019  . Non-intractable vomiting 04/24/2019  . Diarrhea 04/24/2019   Resolved Ambulatory Problems    Diagnosis Date Noted  . No Resolved Ambulatory Problems   Past Medical History:  Diagnosis Date  . Anemia   . Ectopic pregnancy   . H/O hernia repair   . Hx of thrombocytopenia   . Hypertension   . Preterm labor   . Tension headache    Reviewed med, allergy, problem list.     Observations/Objective: No acute distress Normal breathing No coughing.  Normal appearance and mood.   .. Today's Vitals   04/24/19 0814  Weight: 150 lb (68 kg)  Height: 4\' 11"  (1.499 m)   Body mass index is 30.3 kg/m.    Assessment and Plan: Marland KitchenDewanna was seen today for nausea and emesis.  Diagnoses and all orders for this visit:  Diarrhea, unspecified type -     Stool culture -     Helicobacter pylori special antigen  Non-intractable vomiting with nausea, unspecified vomiting type  Right upper quadrant abdominal pain -     Helicobacter pylori special antigen  Bloating  Epigastric pain -  sucralfate (CARAFATE) 1 g tablet; Take 1 tablet (1 g total) by mouth 4 (four) times daily -  with meals and at bedtime. -     Helicobacter pylori special antigen  Gastroesophageal reflux disease without esophagitis -     sucralfate (CARAFATE) 1 g tablet; Take 1 tablet (1 g total) by mouth 4 (four) times daily -  with meals and at bedtime. -     Helicobacter pylori special antigen   Exact etiology unclear at this point  gastritis vs PUD(h.pylori) vs cholecystitis vs viral gastroenteritis vs pancreatitis.  Pt denies any blood in stool which is reassuring.  Needs covid testing due to GI symptoms.  Continue omeprazole. Start carafate.  Will get stool culture and h.pylori testing in stool.  If covid negative proceed with abdominal ultrasound and labs(cbc/cmp/lipase).  Keep BRAT diet and avoid caffeine and alcohol.  Follow up as needed or if symptoms worsen.    Follow Up Instructions:    I discussed the assessment and treatment plan with the patient. The patient was provided an opportunity to ask questions and all were answered. The patient agreed with the plan and demonstrated an understanding of the instructions.   The patient was advised to call back or seek an in-person evaluation if the symptoms worsen or if the condition fails to improve as anticipated.  I provided 20 minutes of non-face-to-face time during this encounter.   Iran Planas, PA-C

## 2019-06-07 ENCOUNTER — Encounter (HOSPITAL_BASED_OUTPATIENT_CLINIC_OR_DEPARTMENT_OTHER): Payer: Self-pay

## 2019-06-07 ENCOUNTER — Other Ambulatory Visit: Payer: Self-pay

## 2019-06-07 ENCOUNTER — Emergency Department (HOSPITAL_BASED_OUTPATIENT_CLINIC_OR_DEPARTMENT_OTHER): Payer: Self-pay

## 2019-06-07 ENCOUNTER — Emergency Department (HOSPITAL_BASED_OUTPATIENT_CLINIC_OR_DEPARTMENT_OTHER)
Admission: EM | Admit: 2019-06-07 | Discharge: 2019-06-08 | Disposition: A | Discharge status: Home / Self Care | Payer: Self-pay | Attending: Emergency Medicine | Admitting: Emergency Medicine

## 2019-06-07 DIAGNOSIS — I10 Essential (primary) hypertension: Secondary | ICD-10-CM | POA: Insufficient documentation

## 2019-06-07 DIAGNOSIS — Z79899 Other long term (current) drug therapy: Secondary | ICD-10-CM | POA: Insufficient documentation

## 2019-06-07 DIAGNOSIS — R110 Nausea: Secondary | ICD-10-CM | POA: Insufficient documentation

## 2019-06-07 DIAGNOSIS — R1031 Right lower quadrant pain: Secondary | ICD-10-CM | POA: Insufficient documentation

## 2019-06-07 LAB — CBC
HCT: 40.9 % (ref 36.0–46.0)
Hemoglobin: 13.8 g/dL (ref 12.0–15.0)
MCH: 30.2 pg (ref 26.0–34.0)
MCHC: 33.7 g/dL (ref 30.0–36.0)
MCV: 89.5 fL (ref 80.0–100.0)
Platelets: 175 10*3/uL (ref 150–400)
RBC: 4.57 MIL/uL (ref 3.87–5.11)
RDW: 12.1 % (ref 11.5–15.5)
WBC: 9.7 10*3/uL (ref 4.0–10.5)
nRBC: 0 % (ref 0.0–0.2)

## 2019-06-07 LAB — URINALYSIS, ROUTINE W REFLEX MICROSCOPIC
Bilirubin Urine: NEGATIVE
Glucose, UA: NEGATIVE mg/dL
Hgb urine dipstick: NEGATIVE
Ketones, ur: NEGATIVE mg/dL
Leukocytes,Ua: NEGATIVE
Nitrite: NEGATIVE
Protein, ur: NEGATIVE mg/dL
Specific Gravity, Urine: >1.03 — ABNORMAL HIGH (ref 1.005–1.030)
pH: 5.5 (ref 5.0–8.0)

## 2019-06-07 LAB — COMPREHENSIVE METABOLIC PANEL
ALT: 23 U/L (ref 0–44)
AST: 22 U/L (ref 15–41)
Albumin: 4.6 g/dL (ref 3.5–5.0)
Alkaline Phosphatase: 76 U/L (ref 38–126)
Anion gap: 11 (ref 5–15)
BUN: 11 mg/dL (ref 6–20)
CO2: 23 mmol/L (ref 22–32)
Calcium: 9.3 mg/dL (ref 8.9–10.3)
Chloride: 101 mmol/L (ref 98–111)
Creatinine, Ser: 0.56 mg/dL (ref 0.44–1.00)
GFR calc Af Amer: >60 mL/min (ref >60)
GFR calc non Af Amer: >60 mL/min (ref >60)
Glucose, Bld: 105 mg/dL — ABNORMAL HIGH (ref 70–99)
Potassium: 3.6 mmol/L (ref 3.5–5.1)
Sodium: 135 mmol/L (ref 135–145)
Total Bilirubin: 0.5 mg/dL (ref 0.3–1.2)
Total Protein: 8.1 g/dL (ref 6.5–8.1)

## 2019-06-07 LAB — LIPASE, BLOOD: Lipase: 21 U/L (ref 11–51)

## 2019-06-07 LAB — PREGNANCY, URINE: Preg Test, Ur: NEGATIVE

## 2019-06-07 MED ORDER — SODIUM CHLORIDE 0.9 % IV BOLUS
1000.0000 mL | Freq: Once | INTRAVENOUS | Status: AC
  Administered 2019-06-07: 1000 mL via INTRAVENOUS

## 2019-06-07 MED ORDER — FENTANYL CITRATE (PF) 100 MCG/2ML IJ SOLN
100.0000 ug | Freq: Once | INTRAMUSCULAR | Status: AC
  Administered 2019-06-07: 100 ug via INTRAVENOUS
  Filled 2019-06-07: qty 2

## 2019-06-07 MED ORDER — SODIUM CHLORIDE 0.9% FLUSH
3.0000 mL | Freq: Once | INTRAVENOUS | Status: DC
  Filled 2019-06-07: qty 3

## 2019-06-07 MED ORDER — ONDANSETRON HCL 4 MG/2ML IJ SOLN
4.0000 mg | Freq: Once | INTRAMUSCULAR | Status: AC
  Administered 2019-06-07: 4 mg via INTRAVENOUS
  Filled 2019-06-07: qty 2

## 2019-06-07 NOTE — ED Triage Notes (Signed)
Pt c/o right side abd pain since 9am-denies v/d, vaginal d/c and change in stool pattern-NAD-steady gait

## 2019-06-07 NOTE — ED Notes (Signed)
PT to CT scan

## 2019-06-07 NOTE — ED Provider Notes (Signed)
[facility] Provider Note: Lowella Dell, MD, FACEP  CSN: [id number] MRN: [id number] ARRIVAL: 06/07/19 at 2004 ROOM: [facility]12/[facility]12   CHIEF COMPLAINT  Abdominal Pain   HISTORY OF PRESENT ILLNESS  06/07/19 11:04 PM FINA HEIZER is a 29 y.o. female who has had right lower quadrant abdominal pain that began about 9 AM today.  It is gradually worsened and she now rates it as a 7 out of 10.  It is sharp in nature and worse with movement or palpation.  She has had nausea with it but no vomiting or diarrhea.  She has had no dysuria, hematuria, vaginal bleeding or vaginal discharge.  She has also had some sensation of feeling hot and cold throughout the day but does not know if she has had a fever.  She was afebrile on arrival.   Past Medical History:  Diagnosis Date  . Anemia   . Ectopic pregnancy   . H/O hernia repair    2 hernia repairs 1 umbilical & 1 groin  . Hx of thrombocytopenia    in pregnancy  . Hypertension    managed with lifestyle modification  . Preterm labor   . Tension headache     Past Surgical History:  Procedure Laterality Date  . ENDOMETRIAL ABLATION    . HERNIA REPAIR     Inguinal 29 yo,, groin  . HERNIA REPAIR  2017   umbilical   . LAPAROSCOPIC TUBAL LIGATION  12/08/2011   Procedure: LAPAROSCOPIC TUBAL LIGATION;  Surgeon: Catalina Antigua, MD;  Location: [facility];  Service: Gynecology;  Laterality: Bilateral;  . tubal reanastamosis    . UMBILICAL HERNIA REPAIR  2016   also had previous umbilical hernia repair    Family History  Problem Relation Age of Onset  . Cancer Maternal Grandmother        breast cancer  . Diabetes Maternal Grandfather   . Hypertension Mother   . Hyperlipidemia Mother   . Other Neg Hx   . Colon cancer Neg Hx   . Esophageal cancer Neg Hx     Social History   Tobacco Use  . Smoking status: Never Smoker  . Smokeless tobacco: Never Used  Substance Use Topics  . Alcohol use: No  . Drug use: No    Prior to  Admission medications   Medication Sig Start Date End Date Taking? Authorizing Provider  acetaminophen (TYLENOL) 650 MG CR tablet Take 1 tablet (650 mg total) by mouth every 8 (eight) hours as needed for pain. 02/17/18   Monica Becton, MD  albuterol (VENTOLIN HFA) 108 (90 Base) MCG/ACT inhaler Inhale 2 puffs into the lungs every 4 (four) hours as needed for wheezing or shortness of breath. 07/31/18   Georgetta Haber, NP  baclofen (LIORESAL) 10 MG tablet TAKE 1 TABLET BY MOUTH EVERYDAY AT BEDTIME 04/18/19   Breeback, Jade L, PA-C  escitalopram (LEXAPRO) 10 MG tablet Take 1 tablet (10 mg total) by mouth daily. 02/22/19   Breeback, Jade L, PA-C  LORazepam (ATIVAN) 0.5 MG tablet Take 1 tablet (0.5 mg total) by mouth every 8 (eight) hours as needed for anxiety. 10/04/18   Breeback, Lonna Cobb, PA-C  Multiple Vitamin (MULTIVITAMIN) tablet Take 1 tablet by mouth daily.    [provider]  ondansetron (ZOFRAN) 8 MG tablet Take 1 tablet (8 mg total) by mouth every 8 (eight) hours as needed for nausea or vomiting. 02/22/19   Breeback, Jade L, PA-C  sucralfate (CARAFATE) 1 g tablet Take  1 tablet (1 g total) by mouth 4 (four) times daily -  with meals and at bedtime. 04/24/19   Breeback, Royetta Car, PA-C  SUMAtriptan (IMITREX) 100 MG tablet Take 1 tablet (100 mg total) by mouth every 2 (two) hours as needed for migraine. May repeat in 2 hours if headache persists or recurs. 02/22/19   Breeback, Jade L, PA-C  topiramate (TOPAMAX) 50 MG tablet Take 1 tablet (50 mg total) by mouth 2 (two) times daily. 02/22/19   Breeback, Luvenia Starch L, PA-C    Allergies Contrast media [iodinated diagnostic agents]   REVIEW OF SYSTEMS  Negative except as noted here or in the History of Present Illness.   PHYSICAL EXAMINATION  Initial Vital Signs Blood pressure 118/68, pulse 88, temperature 98.3 F (36.8 C), temperature source Oral, resp. rate 20, weight 68 kg, last menstrual period 04/26/2019, SpO2 99 %.  Examination General:  Well-developed, well-nourished female in no acute distress; appearance consistent with age of record HENT: normocephalic; atraumatic Eyes: pupils equal, round and reactive to light; extraocular muscles intact Neck: supple  Heart: regular rate and rhythm Lungs: clear to auscultation bilaterally Abdomen: soft; nondistended; right lower quadrant tenderness; bowel sounds present Extremities: No deformity; full range of motion; pulses normal Neurologic: Awake, alert and oriented; motor function intact in all extremities and symmetric; no facial droop Skin: Warm and dry Psychiatric: Normal mood and affect   RESULTS  Summary of this visit's results, reviewed and interpreted by myself:   EKG Interpretation  Date/Time:    Ventricular Rate:    PR Interval:    QRS Duration:   QT Interval:    QTC Calculation:   R Axis:     Text Interpretation:        Laboratory Studies: Results for orders placed or performed during the hospital encounter of 06/07/19 (from the past 24 hour(s))  Urinalysis, Routine w reflex microscopic     Status: Abnormal   Collection Time: 06/07/19  8:22 PM  Result Value Ref Range   Color, Urine YELLOW YELLOW   APPearance CLOUDY (A) CLEAR   Specific Gravity, Urine >1.030 (H) 1.005 - 1.030   pH 5.5 5.0 - 8.0   Glucose, UA NEGATIVE NEGATIVE mg/dL   Hgb urine dipstick NEGATIVE NEGATIVE   Bilirubin Urine NEGATIVE NEGATIVE   Ketones, ur NEGATIVE NEGATIVE mg/dL   Protein, ur NEGATIVE NEGATIVE mg/dL   Nitrite NEGATIVE NEGATIVE   Leukocytes,Ua NEGATIVE NEGATIVE  Pregnancy, urine     Status: None   Collection Time: 06/07/19  8:22 PM  Result Value Ref Range   Preg Test, Ur NEGATIVE NEGATIVE  Lipase, blood     Status: None   Collection Time: 06/07/19  9:39 PM  Result Value Ref Range   Lipase 21 11 - 51 U/L  Comprehensive metabolic panel     Status: Abnormal   Collection Time: 06/07/19  9:39 PM  Result Value Ref Range   Sodium 135 135 - 145 mmol/L   Potassium 3.6 3.5  - 5.1 mmol/L   Chloride 101 98 - 111 mmol/L   CO2 23 22 - 32 mmol/L   Glucose, Bld 105 (H) 70 - 99 mg/dL   BUN 11 6 - 20 mg/dL   Creatinine, Ser 0.56 0.44 - 1.00 mg/dL   Calcium 9.3 8.9 - 10.3 mg/dL   Total Protein 8.1 6.5 - 8.1 g/dL   Albumin 4.6 3.5 - 5.0 g/dL   AST 22 15 - 41 U/L   ALT 23 0 - 44 U/L  Alkaline Phosphatase 76 38 - 126 U/L   Total Bilirubin 0.5 0.3 - 1.2 mg/dL   GFR calc non Af Amer >60 >60 mL/min   GFR calc Af Amer >60 >60 mL/min   Anion gap 11 5 - 15  CBC     Status: None   Collection Time: 06/07/19  9:39 PM  Result Value Ref Range   WBC 9.7 4.0 - 10.5 K/uL   RBC 4.57 3.87 - 5.11 MIL/uL   Hemoglobin 13.8 12.0 - 15.0 g/dL   HCT 33.0 07.6 - 22.6 %   MCV 89.5 80.0 - 100.0 fL   MCH 30.2 26.0 - 34.0 pg   MCHC 33.7 30.0 - 36.0 g/dL   RDW 33.3 54.5 - 62.5 %   Platelets 175 150 - 400 K/uL   nRBC 0.0 0.0 - 0.2 %   Imaging Studies: CT ABDOMEN PELVIS WO CONTRAST  Result Date: 06/07/2019 CLINICAL DATA:  Right lower quadrant pain EXAM: CT ABDOMEN AND PELVIS WITHOUT CONTRAST TECHNIQUE: Multidetector CT imaging of the abdomen and pelvis was performed following the standard protocol without IV contrast. COMPARISON:  05/23/2018, 01/26/2015 FINDINGS: Lower chest: No acute abnormality. Hepatobiliary: No focal liver abnormality is seen. No gallstones, gallbladder wall thickening, or biliary dilatation. Pancreas: Unremarkable. No pancreatic ductal dilatation or surrounding inflammatory changes. Spleen: Normal in size without focal abnormality. Adrenals/Urinary Tract: Adrenal glands are normal. Hyperdense medullary pyramids consistent with nephrocalcinosis. Punctate stone in the mid left kidney. No hydronephrosis or ureteral stone. The bladder is normal Stomach/Bowel: Stomach is within normal limits. Appendix appears normal. No evidence of bowel wall thickening, distention, or inflammatory changes. Vascular/Lymphatic: No significant vascular findings are present. No enlarged abdominal  or pelvic lymph nodes. Reproductive: Uterus and bilateral adnexa are unremarkable. Other: Previous ventral hernia repair.  No free air or free fluid Musculoskeletal: No acute or significant osseous findings. IMPRESSION: 1. No CT evidence for acute intra-abdominal or pelvic abnormality. Negative for acute appendicitis 2. Medullary nephrocalcinosis.  Punctate stone in the left kidney. Electronically Signed   By: Jasmine Pang M.D.   On: 06/07/2019 23:42    ED COURSE and MDM  Nursing notes, initial and subsequent vitals signs, including pulse oximetry, reviewed and interpreted by myself.  Vitals:   06/07/19 2020 06/07/19 2302  BP: (!) 122/92 118/68  Pulse: 89 88  Resp: 18 20  Temp: 98.8 F (37.1 C) 98.3 F (36.8 C)  TempSrc: Oral Oral  SpO2: 99% 99%  Weight: 68 kg    Medications  sodium chloride flush (NS) 0.9 % injection 3 mL (3 mLs Intravenous Not Given 06/07/19 2307)  ondansetron (ZOFRAN) injection 4 mg (4 mg Intravenous Given 06/07/19 2317)  fentaNYL (SUBLIMAZE) injection 100 mcg (100 mcg Intravenous Given 06/07/19 2317)  sodium chloride 0.9 % bolus 1,000 mL (1,000 mLs Intravenous New Bag/Given 06/07/19 2322)   Patient advised of reassuring CT scan.  Her pain could represent mittelschmerz.  On the possibility that this represents an early subclinical appendicitis she was advised to return within the next 12 hours if symptoms worsen or persist and we will reevaluate her.   PROCEDURES  Procedures   ED DIAGNOSES     ICD-10-CM   1. Right lower quadrant abdominal pain  R10.31        Treyven Lafauci, Jonny Ruiz, MD 06/07/19 2358

## 2019-06-25 DIAGNOSIS — O039 Complete or unspecified spontaneous abortion without complication: Secondary | ICD-10-CM | POA: Insufficient documentation

## 2019-06-27 MED ORDER — GENERIC EXTERNAL MEDICATION
Status: DC
Start: ? — End: 2019-06-27

## 2019-06-27 MED ORDER — ESCITALOPRAM OXALATE 10 MG PO TABS
10.00 | ORAL_TABLET | ORAL | Status: DC
Start: 2019-06-27 — End: 2019-06-27

## 2019-06-27 MED ORDER — PROMETHAZINE HCL 25 MG PO TABS
12.50 | ORAL_TABLET | ORAL | Status: DC
Start: ? — End: 2019-06-27

## 2019-06-27 MED ORDER — OXYCODONE HCL 5 MG PO TABS
5.00 | ORAL_TABLET | ORAL | Status: DC
Start: ? — End: 2019-06-27

## 2019-06-27 MED ORDER — KETOROLAC TROMETHAMINE 15 MG/ML IJ SOLN
15.00 | INTRAMUSCULAR | Status: DC
Start: 2019-06-27 — End: 2019-06-27

## 2019-06-27 MED ORDER — KCL IN DEXTROSE-NACL 20-5-0.45 MEQ/L-%-% IV SOLN
125.00 | INTRAVENOUS | Status: DC
Start: ? — End: 2019-06-27

## 2019-06-27 MED ORDER — SODIUM CHLORIDE 0.9 % IV SOLN
10.00 | INTRAVENOUS | Status: DC
Start: ? — End: 2019-06-27

## 2019-06-27 MED ORDER — PHENAZOPYRIDINE HCL 200 MG PO TABS
100.00 | ORAL_TABLET | ORAL | Status: DC
Start: ? — End: 2019-06-27

## 2019-06-27 MED ORDER — OXYCODONE HCL 10 MG PO TABS
10.00 | ORAL_TABLET | ORAL | Status: DC
Start: ? — End: 2019-06-27

## 2019-06-27 MED ORDER — HYDROMORPHONE HCL 1 MG/ML IJ SOLN
0.50 | INTRAMUSCULAR | Status: DC
Start: ? — End: 2019-06-27

## 2019-06-27 MED ORDER — PROMETHAZINE HCL 25 MG/ML IJ SOLN
12.50 | INTRAMUSCULAR | Status: DC
Start: ? — End: 2019-06-27

## 2019-08-15 ENCOUNTER — Encounter: Payer: Self-pay | Admitting: Family Medicine

## 2019-08-15 ENCOUNTER — Telehealth (INDEPENDENT_AMBULATORY_CARE_PROVIDER_SITE_OTHER): Payer: Medicaid Other | Admitting: Family Medicine

## 2019-08-15 DIAGNOSIS — R232 Flushing: Secondary | ICD-10-CM

## 2019-08-15 DIAGNOSIS — G43009 Migraine without aura, not intractable, without status migrainosus: Secondary | ICD-10-CM

## 2019-08-15 MED ORDER — TOPIRAMATE 50 MG PO TABS: 50.0000 mg | ORAL_TABLET | Freq: Two times a day (BID) | ORAL | 2 refills | Status: DC

## 2019-08-15 NOTE — Assessment & Plan Note (Signed)
She is experiencing flushing with headaches and increased blood pressure that is somewhat concerning for hyperandrenergic spells.  I would like to see her for a face to face visit to check her BP in office and discuss labs if needed.

## 2019-08-15 NOTE — Progress Notes (Signed)
Michelle Macias - 29 y.o. female MRN [id number]  Date of birth: 11-21-1990   This visit type was conducted due to national recommendations for restrictions regarding the COVID-19 Pandemic (e.g. social distancing).  This format is felt to be most appropriate for this patient at this time.  All issues noted in this document were discussed and addressed.  No physical exam was performed (except for noted visual exam findings with Video Visits).  I discussed the limitations of evaluation and management by telemedicine and the availability of in person appointments. The patient expressed understanding and agreed to proceed.  I connected with@ on 08/15/19 at  2:40 PM EDT by a video enabled telemedicine application and verified that I am speaking with the correct person using two identifiers.  Present at visit: Everrett Coombe, DO Normajean Glasgow   Patient Location: Home [address] [place] [address]   Provider location:   [facility]  Chief Complaint  Patient presents with  . Headache  . Hypertension    HPI  Michelle Macias is a 29 y.o. female who presents via audio/video conferencing for a telehealth visit today.  She has a history of migraines with current treatment consisting of topiramate daily and imitrex as needed.  These have worked well for her migraines however over the past couple of weeks she has started having episodes of facial and chest flushing, chest tightness, pressure in her face, mild shortness of breath (feels like she went for a run), and increased headaches which are different from her typical headaches.  She has checked her BP during these episodes with readings of 150-160/90's.  BP outside of these episodes are 130's/80's.  She denies nausea, palpitations or chest pain.  She feels that her anxiety is well controlled.     ROS:  A comprehensive ROS was completed and negative except as noted per HPI  Past Medical History:  Diagnosis Date  . Anemia   .  Ectopic pregnancy   . H/O hernia repair    2 hernia repairs 1 umbilical & 1 groin  . Hx of thrombocytopenia    in pregnancy  . Hypertension    managed with lifestyle modification  . Preterm labor   . Tension headache     Past Surgical History:  Procedure Laterality Date  . ENDOMETRIAL ABLATION    . HERNIA REPAIR     Inguinal 29 yo,, groin  . HERNIA REPAIR  2017   umbilical   . LAPAROSCOPIC TUBAL LIGATION  12/08/2011   Procedure: LAPAROSCOPIC TUBAL LIGATION;  Surgeon: Catalina Antigua, MD;  Location: [facility];  Service: Gynecology;  Laterality: Bilateral;  . tubal reanastamosis    . UMBILICAL HERNIA REPAIR  2016   also had previous umbilical hernia repair    Family History  Problem Relation Age of Onset  . Cancer Maternal Grandmother        breast cancer  . Diabetes Maternal Grandfather   . Hypertension Mother   . Hyperlipidemia Mother   . Other Neg Hx   . Colon cancer Neg Hx   . Esophageal cancer Neg Hx     Social History   Socioeconomic History  . Marital status: Legally Separated    Spouse name: Not on file  . Number of children: 3  . Years of education: Not on file  . Highest education level: Not on file  Occupational History  . Occupation: Airline pilot  Tobacco Use  . Smoking status: Never Smoker  . Smokeless tobacco:  Never Used  Vaping Use  . Vaping Use: Never used  Substance and Sexual Activity  . Alcohol use: No  . Drug use: No  . Sexual activity: Not on file  Other Topics Concern  . Not on file  Social History Narrative  . Not on file   Social Determinants of Health   Financial Resource Strain:   . Difficulty of Paying Living Expenses:   Food Insecurity:   . Worried About Programme researcher, broadcasting/film/video in the Last Year:   . Barista in the Last Year:   Transportation Needs:   . Freight forwarder (Medical):   Marland Kitchen Lack of Transportation (Non-Medical):   Physical Activity:   . Days of Exercise per Week:   . Minutes of Exercise per Session:    Stress:   . Feeling of Stress :   Social Connections:   . Frequency of Communication with Friends and Family:   . Frequency of Social Gatherings with Friends and Family:   . Attends Religious Services:   . Active Member of Clubs or Organizations:   . Attends Banker Meetings:   Marland Kitchen Marital Status:   Intimate Partner Violence:   . Fear of Current or Ex-Partner:   . Emotionally Abused:   Marland Kitchen Physically Abused:   . Sexually Abused:      Current Outpatient Medications:  .  acetaminophen (TYLENOL) 650 MG CR tablet, Take 1 tablet (650 mg total) by mouth every 8 (eight) hours as needed for pain., Disp: 90 tablet, Rfl: 3 .  albuterol (VENTOLIN HFA) 108 (90 Base) MCG/ACT inhaler, Inhale 2 puffs into the lungs every 4 (four) hours as needed for wheezing or shortness of breath., Disp: 8 g, Rfl: 0 .  baclofen (LIORESAL) 10 MG tablet, TAKE 1 TABLET BY MOUTH EVERYDAY AT BEDTIME, Disp: 90 tablet, Rfl: 0 .  Multiple Vitamin (MULTIVITAMIN) tablet, Take 1 tablet by mouth daily., Disp: , Rfl:  .  sucralfate (CARAFATE) 1 g tablet, Take 1 tablet (1 g total) by mouth 4 (four) times daily -  with meals and at bedtime., Disp: 90 tablet, Rfl: 0 .  SUMAtriptan (IMITREX) 100 MG tablet, Take 1 tablet (100 mg total) by mouth every 2 (two) hours as needed for migraine. May repeat in 2 hours if headache persists or recurs., Disp: 10 tablet, Rfl: 0 .  escitalopram (LEXAPRO) 10 MG tablet, Take 1 tablet (10 mg total) by mouth daily. (Patient not taking: Reported on 08/15/2019), Disp: 90 tablet, Rfl: 1 .  LORazepam (ATIVAN) 0.5 MG tablet, Take 1 tablet (0.5 mg total) by mouth every 8 (eight) hours as needed for anxiety. (Patient not taking: Reported on 08/15/2019), Disp: 30 tablet, Rfl: 1 .  ondansetron (ZOFRAN) 8 MG tablet, Take 1 tablet (8 mg total) by mouth every 8 (eight) hours as needed for nausea or vomiting. (Patient not taking: Reported on 08/15/2019), Disp: 20 tablet, Rfl: 0 .  topiramate (TOPAMAX) 50 MG  tablet, Take 1 tablet (50 mg total) by mouth 2 (two) times daily. (Patient not taking: Reported on 08/15/2019), Disp: 50 tablet, Rfl: 2  EXAM:  VITALS per patient if applicable: BP (!) 132/93   Temp 98.3 F (36.8 C)   Wt 155 lb (70.3 kg)   BMI 31.31 kg/m   GENERAL: alert, oriented, appears well and in no acute distress  HEENT: atraumatic, conjunttiva clear, no obvious abnormalities on inspection of external nose and ears  NECK: normal movements of the head and neck  LUNGS:  on inspection no signs of respiratory distress, breathing rate appears normal, no obvious gross SOB, gasping or wheezing  CV: no obvious cyanosis  MS: moves all visible extremities without noticeable abnormality  PSYCH/NEURO: pleasant and cooperative, no obvious depression or anxiety, speech and thought processing grossly intact  ASSESSMENT AND PLAN:  Discussed the following assessment and plan:  No problem-specific Assessment & Plan notes found for this encounter.     I discussed the assessment and treatment plan with the patient. The patient was provided an opportunity to ask questions and all were answered. The patient agreed with the plan and demonstrated an understanding of the instructions.   The patient was advised to call back or seek an in-person evaluation if the symptoms worsen or if the condition fails to improve as anticipated.    Everrett Coombe, DO

## 2019-08-15 NOTE — Progress Notes (Signed)
Symptoms:  Headaches still occurring. HA's are not migraines anymore they feel like intense pressure. Ran out of Topamax.  Chest pressure: feels like she's run for miles.

## 2019-08-17 ENCOUNTER — Ambulatory Visit (INDEPENDENT_AMBULATORY_CARE_PROVIDER_SITE_OTHER): Payer: Self-pay | Admitting: Family Medicine

## 2019-08-17 ENCOUNTER — Encounter: Payer: Self-pay | Admitting: Family Medicine

## 2019-08-17 ENCOUNTER — Other Ambulatory Visit: Payer: Self-pay

## 2019-08-17 VITALS — BP 126/89 | HR 80 | Temp 97.6°F | Ht 59.06 in | Wt 156.4 lb

## 2019-08-17 DIAGNOSIS — R42 Dizziness and giddiness: Secondary | ICD-10-CM

## 2019-08-17 DIAGNOSIS — R030 Elevated blood-pressure reading, without diagnosis of hypertension: Secondary | ICD-10-CM

## 2019-08-17 DIAGNOSIS — R232 Flushing: Secondary | ICD-10-CM

## 2019-08-17 NOTE — Progress Notes (Signed)
Michelle Macias - 29 y.o. female MRN [id number]  Date of birth: [birth year]  Subjective Chief Complaint  Patient presents with  . Headache    HPI  Michelle Macias is a 29 y.o. female here today to discuss episodes of elevated BP and headaches. She has a history of migraines with current treatment consisting of topiramate daily and imitrex as needed.  These have worked well for her migraines however over the past couple of weeks she has started having episodes of facial and chest flushing, chest tightness, pressure in her face, mild shortness of breath (feels like she went for a run), and increased headaches which are different from her typical headaches.  She has also had some dizziness at times.  She has checked her BP during these episodes with readings of 150-160/90's.  BP outside of these episodes are 130's/80's.  She denies nausea, palpitations or chest pain.  She feels that her anxiety is well controlled.  ROS:  A comprehensive ROS was completed and negative except as noted per HPI    Allergies  Allergen Reactions  . Contrast Media [Iodinated Diagnostic Agents] Anaphylaxis    Started today 05/06/15    Past Medical History:  Diagnosis Date  . Anemia   . Ectopic pregnancy   . H/O hernia repair    2 hernia repairs 1 umbilical & 1 groin  . Hx of thrombocytopenia    in pregnancy  . Hypertension    managed with lifestyle modification  . Preterm labor   . Tension headache     Past Surgical History:  Procedure Laterality Date  . ENDOMETRIAL ABLATION    . HERNIA REPAIR     Inguinal 29 yo,, groin  . HERNIA REPAIR  2017   umbilical   . LAPAROSCOPIC TUBAL LIGATION  12/08/2011   Procedure: LAPAROSCOPIC TUBAL LIGATION;  Surgeon: Catalina Antigua, MD;  Location: [facility];  Service: Gynecology;  Laterality: Bilateral;  . tubal reanastamosis    . UMBILICAL HERNIA REPAIR  2016   also had previous umbilical hernia repair    Social History   Socioeconomic History  . Marital  status: Legally Separated    Spouse name: Not on file  . Number of children: 3  . Years of education: Not on file  . Highest education level: Not on file  Occupational History  . Occupation: Airline pilot  Tobacco Use  . Smoking status: Never Smoker  . Smokeless tobacco: Never Used  Vaping Use  . Vaping Use: Never used  Substance and Sexual Activity  . Alcohol use: No  . Drug use: No  . Sexual activity: Not on file  Other Topics Concern  . Not on file  Social History Narrative  . Not on file   Social Determinants of Health   Financial Resource Strain:   . Difficulty of Paying Living Expenses:   Food Insecurity:   . Worried About Programme researcher, broadcasting/film/video in the Last Year:   . Barista in the Last Year:   Transportation Needs:   . Freight forwarder (Medical):   Marland Kitchen Lack of Transportation (Non-Medical):   Physical Activity:   . Days of Exercise per Week:   . Minutes of Exercise per Session:   Stress:   . Feeling of Stress :   Social Connections:   . Frequency of Communication with Friends and Family:   . Frequency of Social Gatherings with Friends and Family:   . Attends Religious Services:   . Active Member of  Clubs or Organizations:   . Attends Banker Meetings:   Marland Kitchen Marital Status:     Family History  Problem Relation Age of Onset  . Cancer Maternal Grandmother        breast cancer  . Diabetes Maternal Grandfather   . Hypertension Mother   . Hyperlipidemia Mother   . Other Neg Hx   . Colon cancer Neg Hx   . Esophageal cancer Neg Hx     Health Maintenance  Topic Date Due  . Hepatitis C Screening  Never done  . COVID-19 Vaccine (1) Never done  . PAP SMEAR-Modifier  Never done  . INFLUENZA VACCINE  08/20/2019  . TETANUS/TDAP  06/28/2021  . HIV Screening  Completed      ----------------------------------------------------------------------------------------------------------------------------------------------------------------------------------------------------------------- Physical Exam BP (!) 126/89 (BP Location: Left Arm, Patient Position: Sitting, Cuff Size: Normal)   Pulse 80   Temp 97.6 F (36.4 C) (Temporal)   Ht 4' 11.06" (1.5 m)   Wt 156 lb 6.4 oz (70.9 kg)   SpO2 96%   BMI 31.53 kg/m   Physical Exam Constitutional:      Appearance: She is well-developed.  HENT:     Head: Normocephalic and atraumatic.  Eyes:     General: No scleral icterus. Cardiovascular:     Rate and Rhythm: Normal rate and regular rhythm.  Pulmonary:     Effort: Pulmonary effort is normal.  Musculoskeletal:     Cervical back: Neck supple.  Neurological:     Mental Status: She is alert.  Psychiatric:        Mood and Affect: Mood normal.     ------------------------------------------------------------------------------------------------------------------------------------------------------------------------------------------------------------------- Assessment and Plan  Flushing Her symptoms of flushing with elevated BP and headaches are concerning for hyperadrenergic episodes. DDx also includes anxiety but she feels that this is well controlled.  Checking Urine and serum catecholamines and metanephrines.   Will also check TSH and urine 5-HIAA as well.  She keep [place] updated on her symptoms if these worsen or change.     No orders of the defined types were placed in this encounter.   No follow-ups on file.    This visit occurred during the SARS-CoV-2 public health emergency.  Safety protocols were in place, including screening questions prior to the visit, additional usage of staff PPE, and extensive cleaning of exam room while observing appropriate contact time as indicated for disinfecting solutions.

## 2019-08-17 NOTE — Assessment & Plan Note (Signed)
Her symptoms of flushing with elevated BP and headaches are concerning for hyperadrenergic episodes. DDx also includes anxiety but she feels that this is well controlled.  Checking Urine and serum catecholamines and metanephrines.   Will also check TSH and urine 5-HIAA as well.  She keep [place] updated on her symptoms if these worsen or change.

## 2019-08-17 NOTE — Patient Instructions (Signed)
Nice to meet you today! Have labs completed, we'll be in touch with results.

## 2019-08-21 ENCOUNTER — Encounter: Payer: Self-pay | Admitting: Family Medicine

## 2019-08-22 ENCOUNTER — Telehealth: Payer: Self-pay | Admitting: Physician Assistant

## 2019-08-25 ENCOUNTER — Encounter: Payer: Self-pay | Admitting: Family Medicine

## 2019-08-25 LAB — CATECHOLAMINES, FRACTIONATED, URINE, 24 HOUR
Calc Total (E+NE): 26 mcg/24 h (ref 26–121)
Creatinine, Urine mg/day-CATEUR: 0.66 g/(24.h) (ref 0.50–2.15)
Dopamine 24 Hr Urine: 132 mcg/24 h (ref 52–480)
Norepinephrine, 24H, Ur: 26 mcg/24 h (ref 15–100)
Total Volume: 500 mL

## 2019-08-25 LAB — METANEPHRINES, PLASMA
Metanephrine, Free: <25 pg/mL (ref <=57)
Normetanephrine, Free: 92 pg/mL (ref <=148)
Total Metanephrines-Plasma: 92 pg/mL (ref <=205)

## 2019-08-25 LAB — CATECHOLAMINES, FRACTIONATED, PLASMA
Dopamine: 11 pg/mL
Epinephrine: <20 pg/mL
Norepinephrine: 318 pg/mL
Total Catecholamines: 329 pg/mL

## 2019-08-25 LAB — TSH: TSH: 2.1 mIU/L

## 2019-08-25 LAB — METANEPHRINES, URINE, 24 HOUR
Metaneph Total, Ur: 241 mcg/24 h (ref 115–695)
Metanephrines, Ur: 39 mcg/24 h (ref 36–190)
Normetanephrine, 24H Ur: 202 mcg/24 h (ref 35–482)
Volume, Urine-VMAUR: 500 mL

## 2019-08-25 LAB — 5 HIAA, QUANTITATIVE, URINE, 24 HOUR
5 HIAA, 24 Hour Urine: 2.3 mg/24 h (ref <=6.0)
Total Volume: 500 mL

## 2019-08-25 NOTE — Telephone Encounter (Signed)
I think patient is requesting recent labs be reviewed

## 2019-11-22 ENCOUNTER — Encounter: Payer: Self-pay | Admitting: Physician Assistant

## 2019-11-22 ENCOUNTER — Ambulatory Visit (INDEPENDENT_AMBULATORY_CARE_PROVIDER_SITE_OTHER): Payer: Medicaid Other

## 2019-11-22 ENCOUNTER — Other Ambulatory Visit: Payer: Self-pay

## 2019-11-22 ENCOUNTER — Telehealth (INDEPENDENT_AMBULATORY_CARE_PROVIDER_SITE_OTHER): Payer: Medicaid Other | Admitting: Physician Assistant

## 2019-11-22 VITALS — BP 137/102 | HR 85 | Ht 59.0 in | Wt 156.0 lb

## 2019-11-22 DIAGNOSIS — R519 Headache, unspecified: Secondary | ICD-10-CM

## 2019-11-22 DIAGNOSIS — R030 Elevated blood-pressure reading, without diagnosis of hypertension: Secondary | ICD-10-CM

## 2019-11-22 DIAGNOSIS — M542 Cervicalgia: Secondary | ICD-10-CM

## 2019-11-22 DIAGNOSIS — R42 Dizziness and giddiness: Secondary | ICD-10-CM

## 2019-11-22 DIAGNOSIS — R110 Nausea: Secondary | ICD-10-CM

## 2019-11-22 DIAGNOSIS — H538 Other visual disturbances: Secondary | ICD-10-CM

## 2019-11-22 MED ORDER — DICLOFENAC SODIUM 75 MG PO TBEC: 75.0000 mg | DELAYED_RELEASE_TABLET | Freq: Two times a day (BID) | ORAL | 2 refills | Status: DC

## 2019-11-22 MED ORDER — HYDROCHLOROTHIAZIDE 12.5 MG PO TABS: 12.5000 mg | ORAL_TABLET | Freq: Every day | ORAL | 1 refills | Status: DC

## 2019-11-22 MED ORDER — BACLOFEN 10 MG PO TABS: 10.0000 mg | ORAL_TABLET | Freq: Three times a day (TID) | ORAL | 0 refills | Status: DC

## 2019-11-22 NOTE — Progress Notes (Signed)
History of headaches, worse over the last week Waking up daily with headaches Pain in back of head/neck Having dizziness, blurry vision, nausea Light and sound are bothersome, but don't make it worse Nothing makes it better   Has tried medications below: Sumatriptan - not helping headaches Topamax - no working (was on for 3 weeks per patient) Excedrin migraines/Excedrin tension headache/Ibuprofen/Advil pm - helping sometimes (taking daily for the last week) No other past migraine medications  BP today 154/112 - told patient to wait 10 minutes and recheck that and give reading to [facility]

## 2019-11-22 NOTE — Progress Notes (Signed)
Patient ID: Michelle Macias, female   DOB: 08-09-1990, 29 y.o.   MRN: [id number] .Marland KitchenVirtual Visit via Video Note  I connected with Michelle Macias on 11/22/2019 at 11:10 AM EDT by a video enabled telemedicine application and verified that I am speaking with the correct person using two identifiers.  Location: Patient: home Provider: clinic   I discussed the limitations of evaluation and management by telemedicine and the availability of in person appointments. The patient expressed understanding and agreed to proceed.  History of Present Illness: Patient is a 29 year old female who presents to the clinic with worsening headaches, dizziness, neck pain over the last week.  She has been having neck pain like this for many months.  She denies any injury.  Her neck does feel stiff at times.  She denies any fever, chills, body aches.  She wakes up with these headaches and they are dull all day for the last 2 weeks.  Pain is localized to the back of her head and neck.  When the headaches get worse she does get dizzy, blurry vision, nausea.  Light and sound do seem to bother her. Has tried NSAIDs, imitrex, massages, topamax, excedrin migraine.   BP today is elevated. She is checking at home.    .. Active Ambulatory Problems    Diagnosis Date Noted  . Pregnancy with history of pre-term labor 04/07/2011  . Supervision of high-risk pregnancy 04/13/2011  . Encounter for sterilization 12/08/2011  . Postoperative pain 12/11/2011  . Incisional infection 12/11/2011  . History of hernia repair 04/24/2014  . Hx of umbilical hernia repair 04/24/2014  . RUQ abdominal tenderness 04/24/2014  . Umbilical pain 04/24/2014  . Abdominal tenderness, RLQ (right lower quadrant) 04/24/2014  . Constipation 04/30/2014  . 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 Vitamin D deficiency 05/30/2014  . Right upper quadrant abdominal pain 06/25/2015  . Atypical chest pain 10/08/2015  . Shortness of breath  10/08/2015  . Cough 10/08/2015  . Generalized abdominal pain 01/02/2016  . Anxiety and depression 12/09/2016  . Outbursts of anger 12/09/2016  . Stress reaction 12/09/2016  . Abdominal pain, left lower quadrant 05/18/2017  . Bilateral hand pain 02/17/2018  . Possible pregnancy 02/17/2018  . Stress at home 10/04/2018  . Panic attacks 10/04/2018  . Migraine without aura and without status migrainosus, not intractable 02/22/2019  . Gastric ulcer 02/27/2019  . Bloating 04/24/2019  . Non-intractable vomiting 04/24/2019  . Diarrhea 04/24/2019  . Miscarriage 06/25/2019  . Flushing 08/15/2019   Resolved Ambulatory Problems    Diagnosis Date Noted  . No Resolved Ambulatory Problems   Past Medical History:  Diagnosis Date  . Anemia   . Ectopic pregnancy   . H/O hernia repair   . Hx of thrombocytopenia   . Hypertension   . Preterm labor   . Tension headache    Reviewed med, allergy, problem list.    Observations/Objective: No acute distress Normal mood.  She appears in pain.   .. Today's Vitals   11/22/19 1044 11/22/19 1123  BP: (!) 154/112 (!) 137/102  Pulse: 85   Weight: 156 lb (70.8 kg)   Height: 4\' 11"  (1.499 m)    Body mass index is 31.51 kg/m.    Assessment and Plan: [name] was seen today for headache, eye problem and nausea.  Diagnoses and all orders for this visit:  Worsening headaches -     DG Cervical Spine Complete -     CT Head Wo Contrast -  diclofenac (VOLTAREN) 75 MG EC tablet; Take 1 tablet (75 mg total) by mouth 2 (two) times daily. -     baclofen (LIORESAL) 10 MG tablet; Take 1 tablet (10 mg total) by mouth 3 (three) times daily.  Neck pain -     DG Cervical Spine Complete -     diclofenac (VOLTAREN) 75 MG EC tablet; Take 1 tablet (75 mg total) by mouth 2 (two) times daily. -     baclofen (LIORESAL) 10 MG tablet; Take 1 tablet (10 mg total) by mouth 3 (three) times daily.  Dizziness  Elevated blood pressure reading -      hydrochlorothiazide (HYDRODIURIL) 12.5 MG tablet; Take 1 tablet (12.5 mg total) by mouth daily.   Concern with worsening headaches and neck pain and dizziness.Ordered CT of head and xray of neck to rule out brain mass vs cervical DDD.Start baclofen at bedtime. Start voltaren. Could consider PT or massage therapy. Use icy hot patches, tens unit.   For elevated BP start HCTZ. Recheck in 1 month. BP could have something to do with headache.   Follow Up Instructions:    I discussed the assessment and treatment plan with the patient. The patient was provided an opportunity to ask questions and all were answered. The patient agreed with the plan and demonstrated an understanding of the instructions.   The patient was advised to call back or seek an in-person evaluation if the symptoms worsen or if the condition fails to improve as anticipated.    Tandy Gaw, PA-C

## 2019-11-24 ENCOUNTER — Telehealth: Payer: Medicaid Other | Admitting: Physician Assistant

## 2019-11-24 ENCOUNTER — Encounter: Payer: Self-pay | Admitting: Physician Assistant

## 2019-11-24 NOTE — Progress Notes (Signed)
Michelle Macias,   No acute changes in the cervical spine. It does appear there is some muscle tightening and spasms going on. Some of the treatment we started should help. I suggest some massage therapy or at the least referral to physical therapy. They could use STEM and possible dry needling to help your muscles relax.

## 2019-11-24 NOTE — Progress Notes (Signed)
[facility] [place] news. Head CT is normal.

## 2019-12-14 ENCOUNTER — Other Ambulatory Visit: Payer: Self-pay | Admitting: Physician Assistant

## 2019-12-14 DIAGNOSIS — R030 Elevated blood-pressure reading, without diagnosis of hypertension: Secondary | ICD-10-CM

## 2019-12-14 DIAGNOSIS — M542 Cervicalgia: Secondary | ICD-10-CM

## 2019-12-14 DIAGNOSIS — R519 Headache, unspecified: Secondary | ICD-10-CM

## 2020-01-26 ENCOUNTER — Other Ambulatory Visit: Payer: Self-pay | Admitting: Physician Assistant

## 2020-01-26 DIAGNOSIS — R030 Elevated blood-pressure reading, without diagnosis of hypertension: Secondary | ICD-10-CM

## 2020-02-25 ENCOUNTER — Encounter: Payer: Self-pay | Admitting: Physician Assistant

## 2020-02-25 ENCOUNTER — Telehealth: Payer: Medicaid Other | Admitting: Physician Assistant

## 2020-02-25 DIAGNOSIS — U071 COVID-19: Secondary | ICD-10-CM | POA: Insufficient documentation

## 2020-02-25 DIAGNOSIS — R42 Dizziness and giddiness: Secondary | ICD-10-CM

## 2020-02-25 MED ORDER — MECLIZINE HCL 25 MG PO TABS: 25.0000 mg | ORAL_TABLET | Freq: Three times a day (TID) | ORAL | 0 refills | Status: DC | PRN

## 2020-02-25 MED ORDER — BENZONATATE 100 MG PO CAPS: 100.0000 mg | ORAL_CAPSULE | Freq: Two times a day (BID) | ORAL | 0 refills | Status: DC | PRN

## 2020-02-25 NOTE — Patient Instructions (Signed)
You will use meclizine to help you with your dizziness, and Tessalon pearls for your cough.  I encourage you to continue to stay very well-hydrated, you can use ibuprofen to help you with your headaches.  Please let us know if there is anything else we can do for you  I hope that you feel better soon  Roney Jaffe, PA-C Physician Assistant [facility] [email or web address]   Dizziness Dizziness is a common problem. It is a feeling of unsteadiness or light-headedness. You may feel like you are about to faint. Dizziness can lead to injury if you stumble or fall. Anyone can become dizzy, but dizziness is more common in older adults. This condition can be caused by a number of things, including medicines, dehydration, or illness. Follow these instructions at home: Eating and drinking  Drink enough fluid to keep your urine clear or pale yellow. This helps to keep you from becoming dehydrated. Try to drink more clear fluids, such as water.  Do not drink alcohol.  Limit your caffeine intake if told to do so by your health care provider. Check ingredients and nutrition facts to see if a food or beverage contains caffeine.  Limit your salt (sodium) intake if told to do so by your health care provider. Check ingredients and nutrition facts to see if a food or beverage contains sodium. Activity  Avoid making quick movements. ? Rise slowly from chairs and steady yourself until you feel okay. ? In the morning, first sit up on the side of the bed. When you feel okay, stand slowly while you hold onto something until you know that your balance is fine.  If you need to stand in one place for a long time, move your legs often. Tighten and relax the muscles in your legs while you are standing.  Do not drive or use heavy machinery if you feel dizzy.  Avoid bending down if you feel dizzy. Place items in your home so that they are easy for you to  reach without leaning over. Lifestyle  Do not use any products that contain nicotine or tobacco, such as cigarettes and e-cigarettes. If you need help quitting, ask your health care provider.  Try to reduce your stress level by using methods such as yoga or meditation. Talk with your health care provider if you need help to manage your stress. General instructions  Watch your dizziness for any changes.  Take over-the-counter and prescription medicines only as told by your health care provider. Talk with your health care provider if you think that your dizziness is caused by a medicine that you are taking.  Tell a friend or a family member that you are feeling dizzy. If he or she notices any changes in your behavior, have this person call your health care provider.  Keep all follow-up visits as told by your health care provider. This is important. Contact a health care provider if:  Your dizziness does not go away.  Your dizziness or light-headedness gets worse.  You feel nauseous.  You have reduced hearing.  You have new symptoms.  You are unsteady on your feet or you feel like the room is spinning. Get help right away if:  You vomit or have diarrhea and are unable to eat or drink anything.  You have problems talking, walking, swallowing, or using your arms, hands, or legs.  You feel generally weak.  You are not thinking clearly or you have trouble forming sentences. It may take a  friend or family member to notice this.  You have chest pain, abdominal pain, shortness of breath, or sweating.  Your vision changes.  You have any bleeding.  You have a severe headache.  You have neck pain or a stiff neck.  You have a fever. These symptoms may represent a serious problem that is an emergency. Do not wait to see if the symptoms will go away. Get medical help right away. Call your local emergency services (911 in the [place] Do not drive yourself to the  hospital. Summary  Dizziness is a feeling of unsteadiness or light-headedness. This condition can be caused by a number of things, including medicines, dehydration, or illness.  Anyone can become dizzy, but dizziness is more common in older adults.  Drink enough fluid to keep your urine clear or pale yellow. Do not drink alcohol.  Avoid making quick movements if you feel dizzy. Monitor your dizziness for any changes. This information is not intended to replace advice given to you by your health care provider. Make sure you discuss any questions you have with your health care provider. Document Revised: 01/08/2017 Document Reviewed: 02/08/2016 Elsevier Patient Education  2021 Elsevier Inc.  10 Things You Can Do to Manage Your COVID-19 Symptoms at Home If you have possible or confirmed COVID-19: 1. Stay home except to get medical care. 2. Monitor your symptoms carefully. If your symptoms get worse, call your healthcare provider immediately. 3. Get rest and stay hydrated. 4. If you have a medical appointment, call the healthcare provider ahead of time and tell them that you have or may have COVID-19. 5. For medical emergencies, call 911 and notify the dispatch personnel that you have or may have COVID-19. 6. Cover your cough and sneezes with a tissue or use the inside of your elbow. 7. Wash your hands often with soap and water for at least 20 seconds or clean your hands with an alcohol-based hand sanitizer that contains at least 60% alcohol. 8. As much as possible, stay in a specific room and away from other people in your home. Also, you should use a separate bathroom, if available. If you need to be around other people in or outside of the home, wear a mask. 9. Avoid sharing personal items with other people in your household, like dishes, towels, and bedding. 10. Clean all surfaces that are touched often, like counters, tabletops, and doorknobs. Use household cleaning sprays or wipes  according to the label instructions. [email or web address].gov/coronavirus 08/04/2019 This information is not intended to replace advice given to you by your health care provider. Make sure you discuss any questions you have with your health care provider. Document Revised: 11/20/2019 Document Reviewed: 11/20/2019 Elsevier Patient Education  2021 Elsevier Inc.     Person Under Monitoring Name: Michelle GlasgowYesenia N Macias  Location: [address] [place] [id number]   Infection Prevention Recommendations for Individuals Confirmed to have, or Being Evaluated for, 2019 Novel Coronavirus (COVID-19) Infection Who Receive Care at Home  Individuals who are confirmed to have, or are being evaluated for, COVID-19 should follow the prevention steps below until a healthcare provider or local or state health department says they can return to normal activities.  Stay home except to get medical care You should restrict activities outside your home, except for getting medical care. Do not go to work, school, or public areas, and do not use public transportation or taxis.  Call ahead before visiting your doctor Before your medical appointment, call the healthcare provider and  tell them that you have, or are being evaluated for, COVID-19 infection. This will help the healthcare provider's office take steps to keep other people from getting infected. Ask your healthcare provider to call the local or state health department.  Monitor your symptoms Seek prompt medical attention if your illness is worsening (e.g., difficulty breathing). Before going to your medical appointment, call the healthcare provider and tell them that you have, or are being evaluated for, COVID-19 infection. Ask your healthcare provider to call the local or state health department.  Wear a facemask You should wear a facemask that covers your nose and mouth when you are in the same room with other people and when you visit a healthcare provider.  People who live with or visit you should also wear a facemask while they are in the same room with you.  Separate yourself from other people in your home As much as possible, you should stay in a different room from other people in your home. Also, you should use a separate bathroom, if available.  Avoid sharing household items You should not share dishes, drinking glasses, cups, eating utensils, towels, bedding, or other items with other people in your home. After using these items, you should wash them thoroughly with soap and water.  Cover your coughs and sneezes Cover your mouth and nose with a tissue when you cough or sneeze, or you can cough or sneeze into your sleeve. Throw used tissues in a lined trash can, and immediately wash your hands with soap and water for at least 20 seconds or use an alcohol-based hand rub.  Wash your [employer] your hands often and thoroughly with soap and water for at least 20 seconds. You can use an alcohol-based hand sanitizer if soap and water are not available and if your hands are not visibly dirty. Avoid touching your eyes, nose, and mouth with unwashed hands.   Prevention Steps for Caregivers and Household Members of Individuals Confirmed to have, or Being Evaluated for, COVID-19 Infection Being Cared for in the Home  If you live with, or provide care at home for, a person confirmed to have, or being evaluated for, COVID-19 infection please follow these guidelines to prevent infection:  Follow healthcare provider's instructions Make sure that you understand and can help the patient follow any healthcare provider instructions for all care.  Provide for the patient's basic needs You should help the patient with basic needs in the home and provide support for getting groceries, prescriptions, and other personal needs.  Monitor the patient's symptoms If they are getting sicker, call his or her medical provider and tell them that the patient  has, or is being evaluated for, COVID-19 infection. This will help the healthcare provider's office take steps to keep other people from getting infected. Ask the healthcare provider to call the local or state health department.  Limit the number of people who have contact with the patient  If possible, have only one caregiver for the patient.  Other household members should stay in another home or place of residence. If this is not possible, they should stay  in another room, or be separated from the patient as much as possible. Use a separate bathroom, if available.  Restrict visitors who do not have an essential need to be in the home.  Keep older adults, very young children, and other sick people away from the patient Keep older adults, very young children, and those who have compromised immune systems or chronic  health conditions away from the patient. This includes people with chronic heart, lung, or kidney conditions, diabetes, and cancer.  Ensure good ventilation Make sure that shared spaces in the home have good air flow, such as from an air conditioner or an opened window, weather permitting.  Wash your hands often  Wash your hands often and thoroughly with soap and water for at least 20 seconds. You can use an alcohol based hand sanitizer if soap and water are not available and if your hands are not visibly dirty.  Avoid touching your eyes, nose, and mouth with unwashed hands.  Use disposable paper towels to dry your hands. If not available, use dedicated cloth towels and replace them when they become wet.  Wear a facemask and gloves  Wear a disposable facemask at all times in the room and gloves when you touch or have contact with the patient's blood, body fluids, and/or secretions or excretions, such as sweat, saliva, sputum, nasal mucus, vomit, urine, or feces.  Ensure the mask fits over your nose and mouth tightly, and do not touch it during use.  Throw out disposable  facemasks and gloves after using them. Do not reuse.  Wash your hands immediately after removing your facemask and gloves.  If your personal clothing becomes contaminated, carefully remove clothing and launder. Wash your hands after handling contaminated clothing.  Place all used disposable facemasks, gloves, and other waste in a lined container before disposing them with other household waste.  Remove gloves and wash your hands immediately after handling these items.  Do not share dishes, glasses, or other household items with the patient  Avoid sharing household items. You should not share dishes, drinking glasses, cups, eating utensils, towels, bedding, or other items with a patient who is confirmed to have, or being evaluated for, COVID-19 infection.  After the person uses these items, you should wash them thoroughly with soap and water.  Wash laundry thoroughly  Immediately remove and wash clothes or bedding that have blood, body fluids, and/or secretions or excretions, such as sweat, saliva, sputum, nasal mucus, vomit, urine, or feces, on them.  Wear gloves when handling laundry from the patient.  Read and follow directions on labels of laundry or clothing items and detergent. In general, wash and dry with the warmest temperatures recommended on the label.  Clean all areas the individual has used often  Clean all touchable surfaces, such as counters, tabletops, doorknobs, bathroom fixtures, toilets, phones, keyboards, tablets, and bedside tables, every day. Also, clean any surfaces that may have blood, body fluids, and/or secretions or excretions on them.  Wear gloves when cleaning surfaces the patient has come in contact with.  Use a diluted bleach solution (e.g., dilute bleach with 1 part bleach and 10 parts water) or a household disinfectant with a label that says EPA-registered for coronaviruses. To make a bleach solution at home, add 1 tablespoon of bleach to 1 quart (4 cups)  of water. For a larger supply, add  cup of bleach to 1 gallon (16 cups) of water.  Read labels of cleaning products and follow recommendations provided on product labels. Labels contain instructions for safe and effective use of the cleaning product including precautions you should take when applying the product, such as wearing gloves or eye protection and making sure you have good ventilation during use of the product.  Remove gloves and wash hands immediately after cleaning.  Monitor yourself for signs and symptoms of illness Caregivers and household members are considered  close contacts, should monitor their health, and will be asked to limit movement outside of the home to the extent possible. Follow the monitoring steps for close contacts listed on the symptom monitoring form.   ? If you have additional questions, contact your local health department or call the epidemiologist on call at [phone] (available 24/7). ? This guidance is subject to change. For the most up-to-date guidance from [facility], please refer to their website: [employer]Met[employer].hu

## 2020-02-25 NOTE — Progress Notes (Signed)
I connected with  Michelle Macias on 02/25/20 by a video enabled telemedicine application and verified that I am speaking with the correct person using two identifiers.   I discussed the limitations of evaluation and management by telemedicine. The patient expressed understanding and agreed to proceed.    New Patient Office Visit  Subjective:  Patient ID: Michelle Macias, female    DOB: 12-Nov-1990  Age: 30 y.o. MRN: [id number]  CC:  Chief Complaint  Patient presents with  . Dizziness   Virtual Visit via Video Note  I connected with Michelle Macias on 02/25/20 at 11:15 AM EST by a video enabled telemedicine application and verified that I am speaking with the correct person using two identifiers.  Location: Patient: Home Provider: Working remotely from home   I discussed the limitations of evaluation and management by telemedicine and the availability of in person appointments. The patient expressed understanding and agreed to proceed.  History of Present Illness:   Michelle Macias reports that she started having a fever, dry cough, runny nose with clear discharge, sneezing, vomiting, headaches dizziness and body aches on February 3, states that she did a home COVID test which was positive.  Reports that her fever, vomiting and body aches have resolved but continues to have a dry cough, headaches and dizziness.  Reports that she is eating and drinking okay, states she is staying very well-hydrated.  Reports the dizziness is making it hard for her to get up and move around, feels as though the room is spinning.  Reports that she has been using Robitussin cough and cold and Tylenol with a little relief.  Reports all household members currently are sick with Covid.  Reports that she has had 2 Pfizer covid vaccines -with the last one in July 2021  Observations/Objective: Medical history and current medications reviewed, no physical exam completed     Past Medical History:   Diagnosis Date  . Anemia   . Ectopic pregnancy   . H/O hernia repair    2 hernia repairs 1 umbilical & 1 groin  . Hx of thrombocytopenia    in pregnancy  . Hypertension    managed with lifestyle modification  . Preterm labor   . Tension headache     Past Surgical History:  Procedure Laterality Date  . ENDOMETRIAL ABLATION    . HERNIA REPAIR     Inguinal 30 yo,, groin  . HERNIA REPAIR  2017   umbilical   . LAPAROSCOPIC TUBAL LIGATION  12/08/2011   Procedure: LAPAROSCOPIC TUBAL LIGATION;  Surgeon: Catalina Antigua, MD;  Location: [facility];  Service: Gynecology;  Laterality: Bilateral;  . tubal reanastamosis    . UMBILICAL HERNIA REPAIR  2016   also had previous umbilical hernia repair    Family History  Problem Relation Age of Onset  . Cancer Maternal Grandmother        breast cancer  . Diabetes Maternal Grandfather   . Hypertension Mother   . Hyperlipidemia Mother   . Other Neg Hx   . Colon cancer Neg Hx   . Esophageal cancer Neg Hx     Social History   Socioeconomic History  . Marital status: Legally Separated    Spouse name: Not on file  . Number of children: 3  . Years of education: Not on file  . Highest education level: Not on file  Occupational History  . Occupation: Airline pilot  Tobacco Use  . Smoking status: Never Smoker  . Smokeless  tobacco: Never Used  Vaping Use  . Vaping Use: Never used  Substance and Sexual Activity  . Alcohol use: No  . Drug use: No  . Sexual activity: Not on file  Other Topics Concern  . Not on file  Social History Narrative  . Not on file   Social Determinants of Health   Financial Resource Strain: Not on file  Food Insecurity: Not on file  Transportation Needs: Not on file  Physical Activity: Not on file  Stress: Not on file  Social Connections: Not on file  Intimate Partner Violence: Not on file    ROS Review of Systems  Constitutional: Positive for fatigue. Negative for chills and fever.  HENT: Negative for  congestion and sore throat.   Eyes: Negative.   Respiratory: Positive for cough. Negative for wheezing.   Cardiovascular: Negative for chest pain.  Gastrointestinal: Negative for abdominal pain, diarrhea, nausea and vomiting.  Endocrine: Negative.   Genitourinary: Negative.   Musculoskeletal: Negative for myalgias.  Skin: Negative.   Allergic/Immunologic: Negative.   Neurological: Positive for dizziness and headaches.  Hematological: Negative.   Psychiatric/Behavioral: Negative.     Objective:   Today's Vitals: There were no vitals taken for this visit.    Assessment & Plan:   Problem List Items Addressed This Visit      Other   COVID-19 - Primary   Relevant Medications   benzonatate (TESSALON) 100 MG capsule    Other Visit Diagnoses    Dizziness       Relevant Medications   meclizine (ANTIVERT) 25 MG tablet      Outpatient Encounter Medications as of 02/25/2020  Medication Sig  . benzonatate (TESSALON) 100 MG capsule Take 1 capsule (100 mg total) by mouth 2 (two) times daily as needed for cough.  . meclizine (ANTIVERT) 25 MG tablet Take 1 tablet (25 mg total) by mouth 3 (three) times daily as needed for dizziness.  Marland Kitchen acetaminophen (TYLENOL) 650 MG CR tablet Take 1 tablet (650 mg total) by mouth every 8 (eight) hours as needed for pain.  Marland Kitchen albuterol (VENTOLIN HFA) 108 (90 Base) MCG/ACT inhaler Inhale 2 puffs into the lungs every 4 (four) hours as needed for wheezing or shortness of breath.  . baclofen (LIORESAL) 10 MG tablet Take 1 tablet (10 mg total) by mouth 3 (three) times daily.  . diclofenac (VOLTAREN) 75 MG EC tablet Take 1 tablet (75 mg total) by mouth 2 (two) times daily.  Marland Kitchen escitalopram (LEXAPRO) 10 MG tablet Take 1 tablet (10 mg total) by mouth daily.  . hydrochlorothiazide (HYDRODIURIL) 12.5 MG tablet Take 1 tablet (12.5 mg total) by mouth daily. appt for further refills  . LORazepam (ATIVAN) 0.5 MG tablet Take 1 tablet (0.5 mg total) by mouth every 8 (eight)  hours as needed for anxiety.  . Multiple Vitamin (MULTIVITAMIN) tablet Take 1 tablet by mouth daily.   No facility-administered encounter medications on file as of 02/25/2020.   Assessment and Plan: 1. COVID-19 Trial Tessalon Perles, continue symptomatic treatment, continue hydration and rest.  Red flags given for prompt reevaluation - benzonatate (TESSALON) 100 MG capsule; Take 1 capsule (100 mg total) by mouth 2 (two) times daily as needed for cough.  Dispense: 20 capsule; Refill: 0  2. Dizziness Trial of meclizine. - meclizine (ANTIVERT) 25 MG tablet; Take 1 tablet (25 mg total) by mouth 3 (three) times daily as needed for dizziness.  Dispense: 30 tablet; Refill: 0   Follow Up Instructions:    I  discussed the assessment and treatment plan with the patient. The patient was provided an opportunity to ask questions and all were answered. The patient agreed with the plan and demonstrated an understanding of the instructions.   The patient was advised to call back or seek an in-person evaluation if the symptoms worsen or if the condition fails to improve as anticipated.  I provided 21 minutes of face-to-face and non-face-to-face time during this encounter.      Follow-up: Return if symptoms worsen or fail to improve.   Kasandra Knudsen Lucely Leard, PA-C

## 2020-03-01 ENCOUNTER — Other Ambulatory Visit: Payer: Self-pay | Admitting: Physician Assistant

## 2020-03-01 DIAGNOSIS — R030 Elevated blood-pressure reading, without diagnosis of hypertension: Secondary | ICD-10-CM

## 2020-03-05 ENCOUNTER — Telehealth (INDEPENDENT_AMBULATORY_CARE_PROVIDER_SITE_OTHER): Payer: Medicaid Other | Admitting: Physician Assistant

## 2020-03-05 ENCOUNTER — Encounter: Payer: Self-pay | Admitting: Physician Assistant

## 2020-03-05 VITALS — HR 93 | Temp 98.9°F

## 2020-03-05 DIAGNOSIS — R059 Cough, unspecified: Secondary | ICD-10-CM

## 2020-03-05 DIAGNOSIS — U071 COVID-19: Secondary | ICD-10-CM

## 2020-03-05 MED ORDER — ALBUTEROL SULFATE HFA 108 (90 BASE) MCG/ACT IN AERS: 2.0000 | INHALATION_SPRAY | RESPIRATORY_TRACT | 2 refills | Status: AC | PRN

## 2020-03-05 MED ORDER — AZITHROMYCIN 250 MG PO TABS: ORAL_TABLET | ORAL | 0 refills | Status: DC

## 2020-03-05 MED ORDER — DEXAMETHASONE 4 MG PO TABS: 4.0000 mg | ORAL_TABLET | Freq: Two times a day (BID) | ORAL | 0 refills | Status: DC

## 2020-03-05 NOTE — Progress Notes (Signed)
Pt stated she tested positive 2 weeks ago symptoms almost gone she continue to cough she states it fills  like she has fluid on her lungs took OTC robitussin.

## 2020-03-05 NOTE — Progress Notes (Signed)
Patient ID: Michelle Macias, female   DOB: 1990-04-19, 30 y.o.   MRN: [id number] .Marland KitchenVirtual Visit via Video Note  I connected with Michelle Macias on 03/05/20 at  2:00 PM EST by a video enabled telemedicine application and verified that I am speaking with the correct person using two identifiers.  Location: Patient: home Provider: clinic  .Marland KitchenParticipating in visit:  Patient: Michelle Macias Provider: Tandy Gaw PA-C   I discussed the limitations of evaluation and management by telemedicine and the availability of in person appointments. The patient expressed understanding and agreed to proceed.  History of Present Illness: Pt is a 30 yo female who presents to the clinic with persistent cough, congestion, chest tightness after testing positive for covid 2 weeks ago. She does not have any pre-existing lung diseases. No fever, chill, body aches. She is taking tessalon pearls and robatussin. She was vaccinated. She has an albuterol inhaler and feeling the need to use more and more. Pulse ox staying 97 percent.     .. Active Ambulatory Problems    Diagnosis Date Noted  . Pregnancy with history of pre-term labor 04/07/2011  . Supervision of high-risk pregnancy 04/13/2011  . Encounter for sterilization 12/08/2011  . Postoperative pain 12/11/2011  . Incisional infection 12/11/2011  . History of hernia repair 04/24/2014  . Hx of umbilical hernia repair 04/24/2014  . RUQ abdominal tenderness 04/24/2014  . Umbilical pain 04/24/2014  . Abdominal tenderness, RLQ (right lower quadrant) 04/24/2014  . Constipation 04/30/2014  . 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 Vitamin D deficiency 05/30/2014  . Right upper quadrant abdominal pain 06/25/2015  . Atypical chest pain 10/08/2015  . Shortness of breath 10/08/2015  . Cough 10/08/2015  . Generalized abdominal pain 01/02/2016  . Anxiety and depression 12/09/2016  . Outbursts of anger 12/09/2016  . Stress reaction 12/09/2016  .  Abdominal pain, left lower quadrant 05/18/2017  . Bilateral hand pain 02/17/2018  . Possible pregnancy 02/17/2018  . Stress at home 10/04/2018  . Panic attacks 10/04/2018  . Migraine without aura and without status migrainosus, not intractable 02/22/2019  . Gastric ulcer 02/27/2019  . Bloating 04/24/2019  . Non-intractable vomiting 04/24/2019  . Diarrhea 04/24/2019  . Miscarriage 06/25/2019  . Flushing 08/15/2019  . COVID-19 02/25/2020   Resolved Ambulatory Problems    Diagnosis Date Noted  . No Resolved Ambulatory Problems   Past Medical History:  Diagnosis Date  . Anemia   . Ectopic pregnancy   . H/O hernia repair   . Hx of thrombocytopenia   . Hypertension   . Preterm labor   . Tension headache    Reviewed med, allergy, problem list.     Observations/Objective: No acute distress No labored breathing Dry to productive cough Normal mood and appearance.  No wheezing.   .. Today's Vitals   03/05/20 1341  Pulse: 93  Temp: 98.9 F (37.2 C)  TempSrc: Oral  SpO2: 97%   There is no height or weight on file to calculate BMI.  Assessment and Plan: Marland KitchenMarland KitchenDiagnoses and all orders for this visit:  COVID-19 virus infection -     dexamethasone (DECADRON) 4 MG tablet; Take 1 tablet (4 mg total) by mouth 2 (two) times daily with a meal. -     azithromycin (ZITHROMAX Z-PAK) 250 MG tablet; Take 2 tablets (500 mg) on  Day 1,  followed by 1 tablet (250 mg) once daily on Days 2 through 5. -     albuterol (VENTOLIN HFA) 108 (90 Base) MCG/ACT  inhaler; Inhale 2 puffs into the lungs every 4 (four) hours as needed for wheezing or shortness of breath.  Cough -     dexamethasone (DECADRON) 4 MG tablet; Take 1 tablet (4 mg total) by mouth 2 (two) times daily with a meal. -     albuterol (VENTOLIN HFA) 108 (90 Base) MCG/ACT inhaler; Inhale 2 puffs into the lungs every 4 (four) hours as needed for wheezing or shortness of breath.   Pt appears stable. Her vitals are good. 2 plus weeks of  cough and congestion. Added zpak, dexamethasone. Continue albuterol. No red flag. If worsening please call office. Rest and hydrate. Follow up as needed.   Follow Up Instructions:    I discussed the assessment and treatment plan with the patient. The patient was provided an opportunity to ask questions and all were answered. The patient agreed with the plan and demonstrated an understanding of the instructions.   The patient was advised to call back or seek an in-person evaluation if the symptoms worsen or if the condition fails to improve as anticipated.    Tandy Gaw, PA-C

## 2020-03-13 ENCOUNTER — Other Ambulatory Visit: Payer: Self-pay | Admitting: Physician Assistant

## 2020-03-13 DIAGNOSIS — R030 Elevated blood-pressure reading, without diagnosis of hypertension: Secondary | ICD-10-CM

## 2020-06-14 ENCOUNTER — Ambulatory Visit (INDEPENDENT_AMBULATORY_CARE_PROVIDER_SITE_OTHER): Payer: Self-pay | Admitting: Medical-Surgical

## 2020-06-14 ENCOUNTER — Encounter: Payer: Self-pay | Admitting: Medical-Surgical

## 2020-06-14 ENCOUNTER — Other Ambulatory Visit: Payer: Self-pay

## 2020-06-14 VITALS — BP 123/81 | HR 93 | Temp 98.0°F | Ht 59.0 in | Wt 152.0 lb

## 2020-06-14 DIAGNOSIS — N644 Mastodynia: Secondary | ICD-10-CM

## 2020-06-14 DIAGNOSIS — Z1159 Encounter for screening for other viral diseases: Secondary | ICD-10-CM

## 2020-06-14 NOTE — Progress Notes (Signed)
Subjective:    CC: Right breast pain  HPI: Pleasant 30 year old female presenting today for evaluation of right breast pain.  Notes that her right breast started hurting her approximately 2 weeks ago.  She originally thought it was related to her cycle so she self treated with Tylenol.  Noted that the Tylenol was helping a bit but is no longer providing any relief.  The pain is described as a throbbing sensation and is rated as high as 5/10 at times.  She had a red sore on the bottom side of her right breast that had skin peeling on top of it.  The lesion was not painful or itchy and she did not notice any drainage from it.  She does have a family history of breast cancer.  Does not drink caffeinated beverages.  Does not smoke or vape.  Only occasional alcohol intake.  She does exercise and endorses wearing a sports bra.  Denies fever, chills, myalgias, and nipple discharge.  I reviewed the past medical history, family history, social history, surgical history, and allergies today and no changes were needed.  Please see the problem list section below in epic for further details.  Past Medical History: Past Medical History:  Diagnosis Date  . Anemia   . Ectopic pregnancy   . H/O hernia repair    2 hernia repairs 1 umbilical & 1 groin  . Hx of thrombocytopenia    in pregnancy  . Hypertension    managed with lifestyle modification  . Preterm labor   . Tension headache    Past Surgical History: Past Surgical History:  Procedure Laterality Date  . ENDOMETRIAL ABLATION    . HERNIA REPAIR     Inguinal 30 yo,, groin  . HERNIA REPAIR  2017   umbilical   . LAPAROSCOPIC TUBAL LIGATION  12/08/2011   Procedure: LAPAROSCOPIC TUBAL LIGATION;  Surgeon: Catalina Antigua, MD;  Location: [facility];  Service: Gynecology;  Laterality: Bilateral;  . tubal reanastamosis    . UMBILICAL HERNIA REPAIR  2016   also had previous umbilical hernia repair   Social History: Social History   Socioeconomic  History  . Marital status: Legally Separated    Spouse name: Not on file  . Number of children: 3  . Years of education: Not on file  . Highest education level: Not on file  Occupational History  . Occupation: Airline pilot  Tobacco Use  . Smoking status: Never Smoker  . Smokeless tobacco: Never Used  Vaping Use  . Vaping Use: Never used  Substance and Sexual Activity  . Alcohol use: No  . Drug use: No  . Sexual activity: Not on file  Other Topics Concern  . Not on file  Social History Narrative  . Not on file   Social Determinants of Health   Financial Resource Strain: Not on file  Food Insecurity: Not on file  Transportation Needs: Not on file  Physical Activity: Not on file  Stress: Not on file  Social Connections: Not on file   Family History: Family History  Problem Relation Age of Onset  . Cancer Maternal Grandmother        breast cancer  . Diabetes Maternal Grandfather   . Hypertension Mother   . Hyperlipidemia Mother   . Other Neg Hx   . Colon cancer Neg Hx   . Esophageal cancer Neg Hx    Allergies: Allergies  Allergen Reactions  . Contrast Media [Iodinated Diagnostic Agents] Anaphylaxis    Started today 05/06/15  Medications: See med rec.  Review of Systems: See HPI for pertinent positives and negatives.   Objective:    General: Well Developed, well nourished, and in no acute distress.  Neuro: Alert and oriented x3.  HEENT: Normocephalic, atraumatic.  Skin: Warm and dry. Cardiac: Regular rate and rhythm.  Respiratory: Not using accessory muscles, speaking in full sentences.  Physical Exam Chest:        Impression and Recommendations:    1. Breast pain, right No obvious masses, nodules, or cysts palpable in the areas of tenderness of the right breast.  We will go ahead and proceed with a diagnostic mammogram of the right breast with right breast ultrasound if needed.  Advised to use Tylenol and ibuprofen as needed for pain.  Can also  consider using cool compresses.  Recommend wearing a well fitted supportive sports bra with any exercise or increased activity. - MM DIAG BREAST TOMO UNI RIGHT; Future  2. Need for hepatitis C screening test Discussed screening recommendations.  She is agreeable to having this done so we will add this today for drawl with future blood work. - Hepatitis C antibody  Return if symptoms worsen or fail to improve. ___________________________________________ Thayer Ohm, DNP, APRN, FNP-BC Primary Care and Sports Medicine [facility] [place]

## 2020-06-20 ENCOUNTER — Other Ambulatory Visit: Payer: Self-pay | Admitting: Medical-Surgical

## 2020-06-20 DIAGNOSIS — N644 Mastodynia: Secondary | ICD-10-CM

## 2020-07-16 ENCOUNTER — Telehealth (INDEPENDENT_AMBULATORY_CARE_PROVIDER_SITE_OTHER): Payer: Medicaid Other | Admitting: Medical-Surgical

## 2020-07-16 ENCOUNTER — Encounter: Payer: Self-pay | Admitting: Medical-Surgical

## 2020-07-16 DIAGNOSIS — F419 Anxiety disorder, unspecified: Secondary | ICD-10-CM

## 2020-07-16 DIAGNOSIS — F410 Panic disorder [episodic paroxysmal anxiety] without agoraphobia: Secondary | ICD-10-CM

## 2020-07-16 DIAGNOSIS — F32A Depression, unspecified: Secondary | ICD-10-CM

## 2020-07-16 MED ORDER — HYDROXYZINE PAMOATE 25 MG PO CAPS: 25.0000 mg | ORAL_CAPSULE | Freq: Three times a day (TID) | ORAL | 0 refills | Status: DC | PRN

## 2020-07-16 MED ORDER — ESCITALOPRAM OXALATE 10 MG PO TABS: 10.0000 mg | ORAL_TABLET | Freq: Every day | ORAL | 1 refills | Status: DC
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
## 2020-08-16 ENCOUNTER — Other Ambulatory Visit: Payer: Medicaid Other

## 2020-08-24 ENCOUNTER — Other Ambulatory Visit: Payer: Medicaid Other

## 2020-08-24 ENCOUNTER — Ambulatory Visit
Admission: RE | Admit: 2020-08-24 | Discharge: 2020-08-24 | Disposition: A | Discharge status: Home / Self Care | Payer: BLUE CROSS/BLUE SHIELD | Source: Ambulatory Visit | Attending: Medical-Surgical | Admitting: Medical-Surgical

## 2020-08-24 ENCOUNTER — Other Ambulatory Visit: Payer: Self-pay

## 2020-08-24 DIAGNOSIS — N644 Mastodynia: Secondary | ICD-10-CM

## 2020-09-13 ENCOUNTER — Telehealth: Payer: Self-pay | Admitting: General Practice

## 2020-09-13 NOTE — Telephone Encounter (Signed)
Transition Care Management Unsuccessful Follow-up Telephone Call  Date of discharge and from where:  09/11/20 from [facility]  Attempts:  1st Attempt  Reason for unsuccessful TCM follow-up call:  Left voice message

## 2020-09-16 NOTE — Telephone Encounter (Signed)
Transition Care Management Follow-up Telephone Call Date of discharge and from where: 09/11/20 from [facility] How have you been since you were released from the hospital? Still having nausea/vomiting x 2 weeks. Any questions or concerns? No  Items Reviewed: Did the pt receive and understand the discharge instructions provided? Yes  Medications obtained and verified? yes Other? No  Any new allergies since your discharge? No  Dietary orders reviewed? Yes Do you have support at home? Yes   Home Care and Equipment/Supplies: Were home health services ordered? no  Functional Questionnaire: (I = Independent and D = Dependent) ADLs: I  Bathing/Dressing- I  Meal Prep- I  Eating- I  Maintaining continence- I  Transferring/Ambulation- I  Managing Meds- I  Follow up appointments reviewed:  PCP Hospital f/u appt confirmed? Yes  Scheduled to see Tandy Gaw  on 09/18/20 @ 1050. Specialist Hospital f/u appt confirmed? No   Are transportation arrangements needed? No  If their condition worsens, is the pt aware to call PCP or go to the Emergency Dept.? Yes Was the patient provided with contact information for the PCP's office or ED? Yes Was to pt encouraged to call back with questions or concerns? Yes

## 2020-09-18 ENCOUNTER — Ambulatory Visit (INDEPENDENT_AMBULATORY_CARE_PROVIDER_SITE_OTHER): Payer: BC Managed Care – PPO | Admitting: Physician Assistant

## 2020-09-18 ENCOUNTER — Other Ambulatory Visit: Payer: Self-pay

## 2020-09-18 ENCOUNTER — Encounter: Payer: Self-pay | Admitting: Physician Assistant

## 2020-09-18 VITALS — BP 120/90 | HR 88 | Temp 98.7°F | Ht 59.0 in | Wt 152.0 lb

## 2020-09-18 DIAGNOSIS — R197 Diarrhea, unspecified: Secondary | ICD-10-CM | POA: Diagnosis not present

## 2020-09-18 DIAGNOSIS — R1013 Epigastric pain: Secondary | ICD-10-CM | POA: Diagnosis not present

## 2020-09-18 DIAGNOSIS — R1031 Right lower quadrant pain: Secondary | ICD-10-CM | POA: Insufficient documentation

## 2020-09-18 DIAGNOSIS — R1032 Left lower quadrant pain: Secondary | ICD-10-CM | POA: Diagnosis not present

## 2020-09-18 DIAGNOSIS — R112 Nausea with vomiting, unspecified: Secondary | ICD-10-CM | POA: Diagnosis not present

## 2020-09-18 MED ORDER — PANTOPRAZOLE SODIUM 40 MG PO TBEC: 40.0000 mg | DELAYED_RELEASE_TABLET | Freq: Every day | ORAL | 0 refills | Status: DC

## 2020-09-18 MED ORDER — HYDROCODONE-ACETAMINOPHEN 5-325 MG PO TABS: 1.0000 | ORAL_TABLET | Freq: Four times a day (QID) | ORAL | 0 refills | Status: AC | PRN

## 2020-09-18 MED ORDER — SUCRALFATE 1 G PO TABS: 1.0000 g | ORAL_TABLET | Freq: Four times a day (QID) | ORAL | 0 refills | Status: DC

## 2020-09-18 MED ORDER — ONDANSETRON 4 MG PO TBDP: 4.0000 mg | ORAL_TABLET | Freq: Three times a day (TID) | ORAL | 1 refills | Status: AC | PRN

## 2020-09-18 NOTE — Progress Notes (Signed)
Subjective:    Patient ID: Michelle Macias, female    DOB: [birth year], 30 y.o.   MRN: [id number]  HPI Pt is a 30 yo female who presents to the clinic to follow up from ED on 09/11/2020 for severe abdominal pain and nausea and vomiting. Pain for the last 3 weeks. She had CT with left non-obstructing kidney stones and small hiatal hernia but no other acute findings. CBC/CMP/UA unremarkable. She was given zofran which helps with nausea. She started omeprazole. She is a little better but continues to have severe pain. She cannot eat. Foods makes worse. She feels bloated and distended after eating anything. She vomits frequently. She feels like stomach is ripping open when she eats. She does have diarrhea but no melena or hematochezia. No fever, chills, body aches.   .. 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   Vitamin D deficiency 05/30/2014   Right upper quadrant abdominal pain 06/25/2015   Atypical chest pain 10/08/2015   Shortness of breath 10/08/2015   Cough 10/08/2015   Generalized abdominal pain 01/02/2016   Anxiety and depression 12/09/2016   Outbursts of anger 12/09/2016   Stress reaction 12/09/2016   Abdominal pain, left lower quadrant 05/18/2017   Bilateral hand pain 02/17/2018   Possible pregnancy 02/17/2018   Stress at home 10/04/2018   Panic attacks 10/04/2018   Migraine without aura and without status migrainosus, not intractable 02/22/2019   Gastric ulcer 02/27/2019    Bloating 04/24/2019   Non-intractable vomiting 04/24/2019   Diarrhea 04/24/2019   Miscarriage 06/25/2019   Flushing 08/15/2019   COVID-19 02/25/2020   Right lower quadrant abdominal pain 09/18/2020   Epigastric pain 09/18/2020   Nausea and vomiting 09/18/2020   Resolved Ambulatory Problems    Diagnosis Date Noted   No Resolved Ambulatory Problems   Past Medical History:  Diagnosis Date   Anemia    Ectopic pregnancy    H/O hernia repair    Hx of thrombocytopenia    Hypertension    Preterm labor    Tension headache       Review of Systems See HPI.     Objective:   Physical Exam Vitals reviewed.  Constitutional:      Appearance: She is well-developed. She is obese.  HENT:     Head: Normocephalic.  Abdominal:     General: Bowel sounds are decreased. There is distension.     Palpations: There is no hepatomegaly or mass.     Tenderness: There is abdominal tenderness in the epigastric area and left lower quadrant. There is no right CVA tenderness, left CVA tenderness or guarding. Negative signs include Murphy's sign, McBurney's sign, psoas sign and obturator sign.     Hernia: No hernia is present.  Neurological:     General: No focal deficit present.     Mental Status: She is alert.  Psychiatric:        Mood and Affect: Mood normal.          Assessment & Plan:  Marland KitchenMarland KitchenGlorine was seen  today for nausea and abdominal pain.  Diagnoses and all orders for this visit:  Nausea and vomiting, intractability of vomiting not specified, unspecified vomiting type -     ondansetron (ZOFRAN-ODT) 4 MG disintegrating tablet; Take 1 tablet (4 mg total) by mouth every 8 (eight) hours as needed for up to 7 days for nausea or vomiting. -     Lipase -     CBC with Differential/Platelet -     COMPLETE METABOLIC PANEL WITH GFR -     Ambulatory referral to Gastroenterology  Epigastric pain -     pantoprazole (PROTONIX) 40 MG tablet; Take 1 tablet (40 mg total) by mouth daily. -      sucralfate (CARAFATE) 1 g tablet; Take 1 tablet (1 g total) by mouth 4 (four) times daily. -     Lipase -     CBC with Differential/Platelet -     HYDROcodone-acetaminophen (NORCO/VICODIN) 5-325 MG tablet; Take 1 tablet by mouth every 6 (six) hours as needed for up to 5 days for moderate pain. -     COMPLETE METABOLIC PANEL WITH GFR -     Ambulatory referral to Gastroenterology  Left lower quadrant abdominal pain -     Lipase -     CBC with Differential/Platelet -     COMPLETE METABOLIC PANEL WITH GFR -     Ambulatory referral to Gastroenterology  Diarrhea, unspecified type  Unclear etiology of symptoms.  CT in ED 1 week ago no acute findings.  Labs unremarkable.  Continues to have epigastric tenderness, pain, nausea, vomiting, diarrhea.  ? Pancreatitis. Will check lipase and repeat cmp and cbc.  Clear liquid diet.  Added protonix and carafate for possible peptic ulcer.  Continue zofran for nausea. Norco small quanity given for severe pain.  Avoid NsAIds. Ok for tylenol.  Marland KitchenMarland KitchenPDMP reviewed during this encounter.  Urgent referral made to GI.

## 2020-09-18 NOTE — Patient Instructions (Signed)
Acute Pancreatitis °Acute pancreatitis happens when the pancreas gets swollen. The pancreas is a large gland in the body that helps to control blood sugar. It also makes enzymes that help to digest food. °This condition can last a few days and cause serious problems. The lungs, heart, and kidneys may stop working. °What are the causes? °Causes include: °Alcohol abuse. °Drug abuse. °Gallstones. °A tumor in the pancreas. °Other causes include: °Some medicines. °Some chemicals. °Diabetes. °An infection. °Damage caused by an accident. °The poison (venom) from a scorpion bite. °Belly (abdominal) surgery. °The body's defense system (immune system) attacking the pancreas (autoimmune pancreatitis). °Genes that are passed from parent to child (inherited). °In some cases, the cause is not known. °What are the signs or symptoms? °Pain in the upper belly that may be felt in the back. The pain may be very bad. °Swelling of the belly. °Feeling sick to your stomach (nauseous) and throwing up (vomiting). °Fever. °How is this treated? °You will likely have to stay in the hospital. Treatment may include: °Pain medicine. °Fluid through an IV tube. °Placing a tube in the stomach to take out the stomach contents. This may help you stop throwing up. °Not eating for 3-4 days. °Antibiotic medicines, if you have an infection. °Treating any other problems that may be the cause. °Steroid medicines, if your problem is caused by your defense system attacking your body's own tissues. °Surgery. °Follow these instructions at home: °Eating and drinking ° °Follow instructions from your doctor about what to eat and drink. °Eat foods that do not have a lot of fat in them. °Eat small meals often. Do not eat big meals. °Drink enough fluid to keep your pee (urine) pale yellow. °Do not drink alcohol if it caused your condition. °Medicines °Take over-the-counter and prescription medicines only as told by your doctor. °Ask your doctor if the medicine  prescribed to you: °Requires you to avoid driving or using heavy machinery. °Can cause trouble pooping (constipation). You may need to take steps to prevent or treat trouble pooping: °Take over-the-counter or prescription medicines. °Eat foods that are high in fiber. These include beans, whole grains, and fresh fruits and vegetables. °Limit foods that are high in fat and sugar. These include fried or sweet foods. °General instructions °Do not use any products that contain nicotine or tobacco, such as cigarettes, e-cigarettes, and chewing tobacco. If you need help quitting, ask your doctor. °Get plenty of rest. °Check your blood sugar at home as told by your doctor. °Keep all follow-up visits as told by your doctor. This is important. °Contact a doctor if: °You do not get better as quickly as expected. °You have new symptoms. °Your symptoms get worse. °You have pain or weakness that lasts a long time. °You keep feeling sick to your stomach. °You get better and then you have pain again. °You have a fever. °Get help right away if: °You cannot eat or keep fluids down. °Your pain gets very bad. °Your skin or the white part of your eyes turns yellow. °You have sudden swelling in your belly. °You throw up. °You feel dizzy or you pass out (faint). °Your blood sugar is high (over 300 mg/dL). °Summary °Acute pancreatitis happens when the pancreas gets swollen. °This condition is often caused by alcohol abuse, drug abuse, or gallstones. °You will likely have to stay in the hospital for treatment. °This information is not intended to replace advice given to you by your health care provider. Make sure you discuss any questions you   have with your health care provider. °Document Revised: 10/25/2017 Document Reviewed: 10/25/2017 °Elsevier Patient Education © 2022 [employer] ° °

## 2020-09-19 LAB — CBC WITH DIFFERENTIAL/PLATELET
Absolute Monocytes: 749 cells/uL (ref 200–950)
Basophils Absolute: 43 cells/uL (ref 0–200)
Basophils Relative: 0.6 %
Eosinophils Absolute: 50 cells/uL (ref 15–500)
Eosinophils Relative: 0.7 %
HCT: 41.3 % (ref 35.0–45.0)
Hemoglobin: 13.4 g/dL (ref 11.7–15.5)
Lymphs Abs: 2642 cells/uL (ref 850–3900)
MCH: 29.1 pg (ref 27.0–33.0)
MCHC: 32.4 g/dL (ref 32.0–36.0)
MCV: 89.6 fL (ref 80.0–100.0)
MPV: 14.4 fL — ABNORMAL HIGH (ref 7.5–12.5)
Monocytes Relative: 10.4 %
Neutro Abs: 3715 cells/uL (ref 1500–7800)
Neutrophils Relative %: 51.6 %
Platelets: 138 10*3/uL — ABNORMAL LOW (ref 140–400)
RBC: 4.61 10*6/uL (ref 3.80–5.10)
RDW: 12.4 % (ref 11.0–15.0)
Total Lymphocyte: 36.7 %
WBC: 7.2 10*3/uL (ref 3.8–10.8)

## 2020-09-19 LAB — LIPASE: Lipase: 15 U/L (ref 7–60)

## 2020-09-19 LAB — COMPLETE METABOLIC PANEL WITH GFR
AG Ratio: 1.7 (calc) (ref 1.0–2.5)
ALT: 12 U/L (ref 6–29)
AST: 13 U/L (ref 10–30)
Albumin: 4.6 g/dL (ref 3.6–5.1)
Alkaline phosphatase (APISO): 77 U/L (ref 31–125)
BUN: 12 mg/dL (ref 7–25)
CO2: 28 mmol/L (ref 20–32)
Calcium: 9.6 mg/dL (ref 8.6–10.2)
Chloride: 102 mmol/L (ref 98–110)
Creat: 0.6 mg/dL (ref 0.50–0.97)
Globulin: 2.7 g/dL (calc) (ref 1.9–3.7)
Glucose, Bld: 78 mg/dL (ref 65–99)
Potassium: 4.5 mmol/L (ref 3.5–5.3)
Sodium: 137 mmol/L (ref 135–146)
Total Bilirubin: 0.4 mg/dL (ref 0.2–1.2)
Total Protein: 7.3 g/dL (ref 6.1–8.1)
eGFR: 123 mL/min/{1.73_m2} (ref >=60)

## 2020-09-19 NOTE — Progress Notes (Signed)
[facility],   Pain is not coming from pancreatitis. Your lipase is normal.  Glucose, kidney, liver look great.  Your platelets are a little low but not too concerning.   Labs are reassuring.

## 2020-10-08 ENCOUNTER — Other Ambulatory Visit: Payer: BLUE CROSS/BLUE SHIELD

## 2020-10-10 ENCOUNTER — Other Ambulatory Visit: Payer: Self-pay | Admitting: Physician Assistant

## 2020-10-10 DIAGNOSIS — R1013 Epigastric pain: Secondary | ICD-10-CM

## 2021-01-15 ENCOUNTER — Telehealth: Payer: Self-pay | Admitting: General Practice

## 2021-01-15 ENCOUNTER — Ambulatory Visit: Payer: BC Managed Care – PPO | Admitting: Physician Assistant

## 2021-01-15 NOTE — Progress Notes (Signed)
No show

## 2021-01-15 NOTE — Telephone Encounter (Signed)
Transition Care Management Follow-up Telephone Call Date of discharge and from where: 01/13/21 [facility] How have you been since you were released from the hospital? Patient had an appt scheduled for her follow up with Tandy Gaw today, 01/15/21, however, she did not show up for her follow up. Any questions or concerns? No

## 2021-01-17 ENCOUNTER — Other Ambulatory Visit: Payer: Self-pay

## 2021-01-17 ENCOUNTER — Ambulatory Visit (INDEPENDENT_AMBULATORY_CARE_PROVIDER_SITE_OTHER): Payer: Medicaid Other | Admitting: Physician Assistant

## 2021-01-17 ENCOUNTER — Encounter: Payer: Self-pay | Admitting: Physician Assistant

## 2021-01-17 VITALS — BP 119/81 | HR 83 | Ht 59.0 in | Wt 155.0 lb

## 2021-01-17 DIAGNOSIS — Z8759 Personal history of other complications of pregnancy, childbirth and the puerperium: Secondary | ICD-10-CM | POA: Diagnosis not present

## 2021-01-17 DIAGNOSIS — O26899 Other specified pregnancy related conditions, unspecified trimester: Secondary | ICD-10-CM | POA: Diagnosis not present

## 2021-01-17 DIAGNOSIS — M545 Low back pain, unspecified: Secondary | ICD-10-CM | POA: Diagnosis not present

## 2021-01-17 DIAGNOSIS — R102 Pelvic and perineal pain: Secondary | ICD-10-CM | POA: Diagnosis not present

## 2021-01-17 DIAGNOSIS — K5900 Constipation, unspecified: Secondary | ICD-10-CM | POA: Diagnosis not present

## 2021-01-17 DIAGNOSIS — Z3A01 Less than 8 weeks gestation of pregnancy: Secondary | ICD-10-CM

## 2021-01-17 LAB — POCT URINALYSIS DIP (CLINITEK)
Bilirubin, UA: NEGATIVE
Blood, UA: NEGATIVE
Glucose, UA: NEGATIVE mg/dL
Ketones, POC UA: NEGATIVE mg/dL
Leukocytes, UA: NEGATIVE
Nitrite, UA: NEGATIVE
POC PROTEIN,UA: NEGATIVE
Spec Grav, UA: >=1.03 — AB (ref 1.010–1.025)
Urobilinogen, UA: 0.2 E.U./dL
pH, UA: 6 (ref 5.0–8.0)

## 2021-01-17 NOTE — Patient Instructions (Addendum)
Miralax daily 4oz of juice for constipation and colace if needed Low back pain tylenol/massage/biofreeze/stretches Start on pre-natals HCG levels today.  Ultrasound next week

## 2021-01-17 NOTE — Progress Notes (Signed)
Subjective:    Patient ID: Michelle Macias, female    DOB: [birth year], 30 y.o.   MRN: [id number]  HPI Pt is a 30 yo female who presents to clinic to follow up after positive pregnancy test in ED on 01/13/2021 with continued left sided pelvic pain and left sided low back pain. She has had fallopian tube reversal with hx of ectopic pregnancy and she is high risk for ectopic pregnancy. She continues to have left sided pelvic pain with no bleeding or discharge. HCG in ED was 84. Pelvic U/S below.   IMPRESSION:   No intrauterine gestational sac seen. There is no abnormal adnexal mass or pelvic free fluid. In the setting of positive pregnancy test, findings could relate to an early nonvisualized intrauterine pregnancy, spontaneous abortion, or ectopic. Correlate clinically.  No injury to her back. No loss of bowel function or saddle anesthesia. Does not radiate past her buttocks. Pt is urinating more frequently but no fever, chills, body aches.  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   Vitamin D deficiency 05/30/2014   Right upper quadrant abdominal pain 06/25/2015   Atypical chest pain 10/08/2015   Shortness of breath 10/08/2015   Cough 10/08/2015   Generalized abdominal pain 01/02/2016   Anxiety and depression 12/09/2016   Outbursts of anger 12/09/2016   Stress reaction 12/09/2016   Abdominal pain, left lower quadrant 05/18/2017   Bilateral hand pain 02/17/2018   Possible pregnancy  02/17/2018   Stress at home 10/04/2018   Panic attacks 10/04/2018   Migraine without aura and without status migrainosus, not intractable 02/22/2019   Gastric ulcer 02/27/2019   Bloating 04/24/2019   Non-intractable vomiting 04/24/2019   Diarrhea 04/24/2019   Miscarriage 06/25/2019   Flushing 08/15/2019   COVID-19 02/25/2020   Right lower quadrant abdominal pain 09/18/2020   Epigastric pain 09/18/2020   Nausea and vomiting 09/18/2020   History of ectopic pregnancy 01/17/2021   Resolved Ambulatory Problems    Diagnosis Date Noted   No Resolved Ambulatory Problems   Past Medical History:  Diagnosis Date   Anemia    Ectopic pregnancy    H/O hernia repair    Hx of thrombocytopenia    Hypertension    Preterm labor    Tension headache      Review of Systems See HPI    Objective:   Physical Exam Vitals reviewed.  Constitutional:      Appearance: Normal appearance. She is obese.  HENT:     Head: Normocephalic.  Neck:     Vascular: No carotid bruit.  Cardiovascular:     Rate and Rhythm: Normal rate and regular rhythm.     Pulses: Normal pulses.     Heart sounds: Normal heart sounds.  Pulmonary:     Breath sounds: Normal breath sounds.  Abdominal:     General: There is no distension.     Palpations: There is no mass.     Tenderness: There is abdominal tenderness. There is no right CVA tenderness, left CVA tenderness or  guarding.     Comments: Tenderness over suprapubic and left lower abdomen, no guarding or rebound.   Musculoskeletal:     Right lower leg: No edema.     Left lower leg: No edema.     Comments: No lumbar tenderness Tenderness into buttocks around piriformis muscle Negative SLR, bilaterally. Strength 5/5 lower extremity  Neurological:     General: No focal deficit present.     Mental Status: She is alert and oriented to person, place, and time.  Psychiatric:        Mood and Affect: Mood normal.      .. Results for orders placed or performed  in visit on 01/17/21  B-HCG Quant  Result Value Ref Range   HCG, Total, QN 123 mIU/mL  POCT URINALYSIS DIP (CLINITEK)  Result Value Ref Range   Color, UA yellow yellow   Clarity, UA clear clear   Glucose, UA negative negative mg/dL   Bilirubin, UA negative negative   Ketones, POC UA negative negative mg/dL   Spec Grav, UA >=1.030 (A) 1.010 - 1.025   Blood, UA negative negative   pH, UA 6.0 5.0 - 8.0   POC PROTEIN,UA negative negative, trace   Urobilinogen, UA 0.2 0.2 or 1.0 E.U./dL   Nitrite, UA Negative Negative   Leukocytes, UA Negative Negative       Assessment & Plan:  Marland KitchenMarland KitchenAidaly was seen today for routine prenatal visit.  Diagnoses and all orders for this visit:  Pelvic pain during pregnancy -     POCT URINALYSIS DIP (CLINITEK)  History of ectopic pregnancy  Constipation, unspecified constipation type  Less than [redacted] weeks gestation of pregnancy -     B-HCG Quant -     [place] OP OB Comp Less 14 Wks; Future   Will check HCG to see if doubling. Will get another U/S next week discussed it is concerning that we do not see gestational sac UA negative for blood, leuks, protein, nitrites. No sign of infection causing pelvic pain If pain worsening or bleeding occurs go to UC or ED.  Some pelvic pain could be expected Make sure taking pre-natal vitamins Miralax for constipation HO for safe pregnancy OTC medications given.  Follow up as needed.

## 2021-01-18 LAB — HCG, QUANTITATIVE, PREGNANCY: HCG, Total, QN: 123 m[IU]/mL

## 2021-01-20 NOTE — Progress Notes (Signed)
HCG is increasing from the 84 but not exactly at the rate we expect. Ultrasound will give [place] better info.

## 2021-01-21 DIAGNOSIS — M545 Low back pain, unspecified: Secondary | ICD-10-CM | POA: Insufficient documentation

## 2021-01-21 DIAGNOSIS — Z3A01 Less than 8 weeks gestation of pregnancy: Secondary | ICD-10-CM | POA: Insufficient documentation

## 2021-01-22 ENCOUNTER — Encounter (HOSPITAL_BASED_OUTPATIENT_CLINIC_OR_DEPARTMENT_OTHER): Payer: Self-pay

## 2021-01-22 ENCOUNTER — Encounter (HOSPITAL_COMMUNITY): Admission: EM | Disposition: A | Discharge status: Home / Self Care | Payer: Self-pay | Source: Home / Self Care

## 2021-01-22 ENCOUNTER — Inpatient Hospital Stay (HOSPITAL_COMMUNITY): Payer: Medicaid Other | Admitting: Certified Registered Nurse Anesthetist

## 2021-01-22 ENCOUNTER — Emergency Department (HOSPITAL_BASED_OUTPATIENT_CLINIC_OR_DEPARTMENT_OTHER): Payer: Medicaid Other

## 2021-01-22 ENCOUNTER — Inpatient Hospital Stay (HOSPITAL_BASED_OUTPATIENT_CLINIC_OR_DEPARTMENT_OTHER)
Admission: EM | Admit: 2021-01-22 | Discharge: 2021-01-22 | Disposition: A | Discharge status: Home / Self Care | Payer: Medicaid Other | Attending: Obstetrics and Gynecology | Admitting: Obstetrics and Gynecology

## 2021-01-22 ENCOUNTER — Other Ambulatory Visit: Payer: Self-pay

## 2021-01-22 DIAGNOSIS — R102 Pelvic and perineal pain: Secondary | ICD-10-CM

## 2021-01-22 DIAGNOSIS — J683 Other acute and subacute respiratory conditions due to chemicals, gases, fumes and vapors: Secondary | ICD-10-CM

## 2021-01-22 DIAGNOSIS — Z20822 Contact with and (suspected) exposure to covid-19: Secondary | ICD-10-CM | POA: Diagnosis not present

## 2021-01-22 DIAGNOSIS — Z3A01 Less than 8 weeks gestation of pregnancy: Secondary | ICD-10-CM | POA: Insufficient documentation

## 2021-01-22 DIAGNOSIS — O26891 Other specified pregnancy related conditions, first trimester: Secondary | ICD-10-CM | POA: Diagnosis present

## 2021-01-22 DIAGNOSIS — O00102 Left tubal pregnancy without intrauterine pregnancy: Secondary | ICD-10-CM | POA: Insufficient documentation

## 2021-01-22 DIAGNOSIS — O009 Unspecified ectopic pregnancy without intrauterine pregnancy: Secondary | ICD-10-CM

## 2021-01-22 HISTORY — PX: DIAGNOSTIC LAPAROSCOPY WITH REMOVAL OF ECTOPIC PREGNANCY: SHX6449

## 2021-01-22 LAB — CBC WITH DIFFERENTIAL/PLATELET
Abs Immature Granulocytes: 0.05 10*3/uL (ref 0.00–0.07)
Basophils Absolute: 0 10*3/uL (ref 0.0–0.1)
Basophils Relative: 0 %
Eosinophils Absolute: 0 10*3/uL (ref 0.0–0.5)
Eosinophils Relative: 0 %
HCT: 38.1 % (ref 36.0–46.0)
Hemoglobin: 13.2 g/dL (ref 12.0–15.0)
Immature Granulocytes: 1 %
Lymphocytes Relative: 37 %
Lymphs Abs: 2.9 10*3/uL (ref 0.7–4.0)
MCH: 30.5 pg (ref 26.0–34.0)
MCHC: 34.6 g/dL (ref 30.0–36.0)
MCV: 88 fL (ref 80.0–100.0)
Monocytes Absolute: 0.6 10*3/uL (ref 0.1–1.0)
Monocytes Relative: 8 %
Neutro Abs: 4.1 10*3/uL (ref 1.7–7.7)
Neutrophils Relative %: 54 %
Platelets: 203 10*3/uL (ref 150–400)
RBC: 4.33 MIL/uL (ref 3.87–5.11)
RDW: 12.5 % (ref 11.5–15.5)
WBC: 7.7 10*3/uL (ref 4.0–10.5)
nRBC: 0 % (ref 0.0–0.2)

## 2021-01-22 LAB — URINALYSIS, ROUTINE W REFLEX MICROSCOPIC
Bilirubin Urine: NEGATIVE
Glucose, UA: NEGATIVE mg/dL
Hgb urine dipstick: NEGATIVE
Ketones, ur: NEGATIVE mg/dL
Leukocytes,Ua: NEGATIVE
Nitrite: NEGATIVE
Protein, ur: NEGATIVE mg/dL
Specific Gravity, Urine: 1.01 (ref 1.005–1.030)
pH: 6 (ref 5.0–8.0)

## 2021-01-22 LAB — CBC
HCT: 32.9 % — ABNORMAL LOW (ref 36.0–46.0)
Hemoglobin: 10.7 g/dL — ABNORMAL LOW (ref 12.0–15.0)
MCH: 29.5 pg (ref 26.0–34.0)
MCHC: 32.5 g/dL (ref 30.0–36.0)
MCV: 90.6 fL (ref 80.0–100.0)
Platelets: 168 10*3/uL (ref 150–400)
RBC: 3.63 MIL/uL — ABNORMAL LOW (ref 3.87–5.11)
RDW: 12.5 % (ref 11.5–15.5)
WBC: 13.5 10*3/uL — ABNORMAL HIGH (ref 4.0–10.5)
nRBC: 0 % (ref 0.0–0.2)

## 2021-01-22 LAB — COMPREHENSIVE METABOLIC PANEL
ALT: 58 U/L — ABNORMAL HIGH (ref 0–44)
AST: 48 U/L — ABNORMAL HIGH (ref 15–41)
Albumin: 4.3 g/dL (ref 3.5–5.0)
Alkaline Phosphatase: 75 U/L (ref 38–126)
Anion gap: 11 (ref 5–15)
BUN: 7 mg/dL (ref 6–20)
CO2: 23 mmol/L (ref 22–32)
Calcium: 9.2 mg/dL (ref 8.9–10.3)
Chloride: 101 mmol/L (ref 98–111)
Creatinine, Ser: 0.58 mg/dL (ref 0.44–1.00)
GFR, Estimated: >60 mL/min (ref >60)
Glucose, Bld: 100 mg/dL — ABNORMAL HIGH (ref 70–99)
Potassium: 3.6 mmol/L (ref 3.5–5.1)
Sodium: 135 mmol/L (ref 135–145)
Total Bilirubin: 0.5 mg/dL (ref 0.3–1.2)
Total Protein: 8 g/dL (ref 6.5–8.1)

## 2021-01-22 LAB — RESP PANEL BY RT-PCR (FLU A&B, COVID) ARPGX2
Influenza A by PCR: NEGATIVE
Influenza B by PCR: NEGATIVE
SARS Coronavirus 2 by RT PCR: NEGATIVE

## 2021-01-22 LAB — HCG, QUANTITATIVE, PREGNANCY: hCG, Beta Chain, Quant, S: 776 m[IU]/mL — ABNORMAL HIGH (ref <5)

## 2021-01-22 LAB — ABO/RH: ABO/RH(D): O POS

## 2021-01-22 LAB — PREPARE RBC (CROSSMATCH)

## 2021-01-22 SURGERY — LAPAROSCOPY, WITH ECTOPIC PREGNANCY SURGICAL TREATMENT
Anesthesia: General | Site: Abdomen

## 2021-01-22 MED ORDER — SODIUM CHLORIDE 0.9 % IR SOLN
Status: DC | PRN
Start: 2021-01-22 — End: 2021-01-22
  Administered 2021-01-22: 1000 mL

## 2021-01-22 MED ORDER — LACTATED RINGERS IV SOLN: INTRAVENOUS | Status: DC | PRN

## 2021-01-22 MED ORDER — LACTATED RINGERS IV BOLUS
1000.0000 mL | Freq: Once | INTRAVENOUS | Status: AC
  Administered 2021-01-22: 1000 mL via INTRAVENOUS

## 2021-01-22 MED ORDER — SODIUM CHLORIDE 0.9% IV SOLUTION: Freq: Once | INTRAVENOUS | Status: DC

## 2021-01-22 MED ORDER — FENTANYL CITRATE PF 50 MCG/ML IJ SOSY
50.0000 ug | PREFILLED_SYRINGE | Freq: Once | INTRAMUSCULAR | Status: DC
  Filled 2021-01-22: qty 1

## 2021-01-22 MED ORDER — OXYCODONE HCL 5 MG PO TABS: 5.0000 mg | ORAL_TABLET | Freq: Once | ORAL | Status: DC | PRN

## 2021-01-22 MED ORDER — DEXAMETHASONE SODIUM PHOSPHATE 10 MG/ML IJ SOLN
INTRAMUSCULAR | Status: AC
  Filled 2021-01-22: qty 1

## 2021-01-22 MED ORDER — ONDANSETRON HCL 4 MG/2ML IJ SOLN
INTRAMUSCULAR | Status: AC
  Filled 2021-01-22: qty 2

## 2021-01-22 MED ORDER — PROPOFOL 10 MG/ML IV BOLUS
INTRAVENOUS | Status: DC | PRN
  Administered 2021-01-22: 170 mg via INTRAVENOUS

## 2021-01-22 MED ORDER — SUGAMMADEX SODIUM 200 MG/2ML IV SOLN
INTRAVENOUS | Status: DC | PRN
  Administered 2021-01-22: 200 mg via INTRAVENOUS

## 2021-01-22 MED ORDER — ORAL CARE MOUTH RINSE: 15.0000 mL | Freq: Once | OROMUCOSAL | Status: AC

## 2021-01-22 MED ORDER — 0.9 % SODIUM CHLORIDE (POUR BTL) OPTIME
TOPICAL | Status: DC | PRN
  Administered 2021-01-22: 1000 mL

## 2021-01-22 MED ORDER — SUCCINYLCHOLINE CHLORIDE 200 MG/10ML IV SOSY
PREFILLED_SYRINGE | INTRAVENOUS | Status: DC | PRN
Start: 2021-01-22 — End: 2021-01-22
  Administered 2021-01-22: 120 mg via INTRAVENOUS

## 2021-01-22 MED ORDER — PROPOFOL 10 MG/ML IV BOLUS
INTRAVENOUS | Status: AC
  Filled 2021-01-22: qty 20

## 2021-01-22 MED ORDER — LACTATED RINGERS IV SOLN
INTRAVENOUS | Status: DC | PRN
Start: 2021-01-22 — End: 2021-01-22

## 2021-01-22 MED ORDER — MIDAZOLAM HCL 5 MG/5ML IJ SOLN
INTRAMUSCULAR | Status: DC | PRN
  Administered 2021-01-22 (×2): 1 mg via INTRAVENOUS

## 2021-01-22 MED ORDER — ONDANSETRON HCL 4 MG/2ML IJ SOLN
INTRAMUSCULAR | Status: DC | PRN
  Administered 2021-01-22: 4 mg via INTRAVENOUS

## 2021-01-22 MED ORDER — FENTANYL CITRATE (PF) 250 MCG/5ML IJ SOLN
INTRAMUSCULAR | Status: DC | PRN
  Administered 2021-01-22: 100 ug via INTRAVENOUS

## 2021-01-22 MED ORDER — BUPIVACAINE HCL (PF) 0.25 % IJ SOLN
INTRAMUSCULAR | Status: AC
  Filled 2021-01-22: qty 30

## 2021-01-22 MED ORDER — PROMETHAZINE HCL 25 MG/ML IJ SOLN: 6.2500 mg | INTRAMUSCULAR | Status: DC | PRN

## 2021-01-22 MED ORDER — MORPHINE SULFATE (PF) 4 MG/ML IV SOLN
4.0000 mg | Freq: Once | INTRAVENOUS | Status: AC
Start: 2021-01-22 — End: 2021-01-22
  Administered 2021-01-22: 4 mg via INTRAVENOUS
  Filled 2021-01-22: qty 1

## 2021-01-22 MED ORDER — FENTANYL CITRATE (PF) 100 MCG/2ML IJ SOLN
25.0000 ug | INTRAMUSCULAR | Status: DC | PRN
  Administered 2021-01-22: 50 ug via INTRAVENOUS

## 2021-01-22 MED ORDER — BUPIVACAINE HCL (PF) 0.25 % IJ SOLN
INTRAMUSCULAR | Status: DC | PRN
  Administered 2021-01-22: 15 mL

## 2021-01-22 MED ORDER — LACTATED RINGERS IV SOLN: INTRAVENOUS | Status: DC

## 2021-01-22 MED ORDER — ALBUMIN HUMAN 5 % IV SOLN
INTRAVENOUS | Status: DC | PRN
Start: 2021-01-22 — End: 2021-01-22

## 2021-01-22 MED ORDER — MIDAZOLAM HCL 2 MG/2ML IJ SOLN
INTRAMUSCULAR | Status: AC
  Filled 2021-01-22: qty 2

## 2021-01-22 MED ORDER — CHLORHEXIDINE GLUCONATE 0.12 % MT SOLN: 15.0000 mL | Freq: Once | OROMUCOSAL | Status: AC

## 2021-01-22 MED ORDER — IBUPROFEN 600 MG PO TABS: 600.0000 mg | ORAL_TABLET | Freq: Four times a day (QID) | ORAL | 3 refills | Status: DC | PRN

## 2021-01-22 MED ORDER — LIDOCAINE 2% (20 MG/ML) 5 ML SYRINGE
INTRAMUSCULAR | Status: DC | PRN
Start: 2021-01-22 — End: 2021-01-22
  Administered 2021-01-22: 60 mg via INTRAVENOUS

## 2021-01-22 MED ORDER — CHLORHEXIDINE GLUCONATE 0.12 % MT SOLN
OROMUCOSAL | Status: AC
  Administered 2021-01-22: 15 mL via OROMUCOSAL
  Filled 2021-01-22: qty 15

## 2021-01-22 MED ORDER — SCOPOLAMINE 1 MG/3DAYS TD PT72
MEDICATED_PATCH | TRANSDERMAL | Status: DC | PRN
  Administered 2021-01-22: 1 via TRANSDERMAL

## 2021-01-22 MED ORDER — FENTANYL CITRATE (PF) 100 MCG/2ML IJ SOLN
INTRAMUSCULAR | Status: AC
  Filled 2021-01-22: qty 2

## 2021-01-22 MED ORDER — DEXAMETHASONE SODIUM PHOSPHATE 10 MG/ML IJ SOLN
INTRAMUSCULAR | Status: DC | PRN
  Administered 2021-01-22: 10 mg via INTRAVENOUS

## 2021-01-22 MED ORDER — OXYCODONE HCL 5 MG/5ML PO SOLN: 5.0000 mg | Freq: Once | ORAL | Status: DC | PRN

## 2021-01-22 MED ORDER — OXYCODONE HCL 5 MG PO TABS: 5.0000 mg | ORAL_TABLET | ORAL | 0 refills | Status: DC | PRN

## 2021-01-22 MED ORDER — SUCCINYLCHOLINE CHLORIDE 200 MG/10ML IV SOSY
PREFILLED_SYRINGE | INTRAVENOUS | Status: AC
  Filled 2021-01-22: qty 10

## 2021-01-22 MED ORDER — PHENYLEPHRINE 40 MCG/ML (10ML) SYRINGE FOR IV PUSH (FOR BLOOD PRESSURE SUPPORT)
PREFILLED_SYRINGE | INTRAVENOUS | Status: DC | PRN
Start: 2021-01-22 — End: 2021-01-22
  Administered 2021-01-22: 120 ug via INTRAVENOUS
  Administered 2021-01-22: 80 ug via INTRAVENOUS
  Administered 2021-01-22 (×2): 120 ug via INTRAVENOUS

## 2021-01-22 MED ORDER — FENTANYL CITRATE (PF) 250 MCG/5ML IJ SOLN
INTRAMUSCULAR | Status: AC
  Filled 2021-01-22: qty 5

## 2021-01-22 MED ORDER — ROCURONIUM BROMIDE 10 MG/ML (PF) SYRINGE
PREFILLED_SYRINGE | INTRAVENOUS | Status: AC
  Filled 2021-01-22: qty 10

## 2021-01-22 MED ORDER — LIDOCAINE 2% (20 MG/ML) 5 ML SYRINGE
INTRAMUSCULAR | Status: AC
  Filled 2021-01-22: qty 5

## 2021-01-22 MED ORDER — CHLORHEXIDINE GLUCONATE 0.12 % MT SOLN
OROMUCOSAL | Status: AC
  Filled 2021-01-22: qty 15

## 2021-01-22 MED ORDER — ROCURONIUM BROMIDE 10 MG/ML (PF) SYRINGE
PREFILLED_SYRINGE | INTRAVENOUS | Status: DC | PRN
  Administered 2021-01-22: 60 mg via INTRAVENOUS

## 2021-01-22 MED ORDER — KETOROLAC TROMETHAMINE 30 MG/ML IJ SOLN
INTRAMUSCULAR | Status: DC | PRN
Start: 2021-01-22 — End: 2021-01-22
  Administered 2021-01-22: 30 mg via INTRAVENOUS

## 2021-01-22 SURGICAL SUPPLY — 40 items
BAG COUNTER SPONGE SURGICOUNT (BAG) ×2 IMPLANT
CABLE HIGH FREQUENCY MONO STRZ (ELECTRODE) IMPLANT
DERMABOND ADHESIVE PROPEN (GAUZE/BANDAGES/DRESSINGS) ×1
DERMABOND ADVANCED (GAUZE/BANDAGES/DRESSINGS) ×1
DERMABOND ADVANCED .7 DNX12 (GAUZE/BANDAGES/DRESSINGS) ×1 IMPLANT
DERMABOND ADVANCED .7 DNX6 (GAUZE/BANDAGES/DRESSINGS) IMPLANT
DURAPREP 26ML APPLICATOR (WOUND CARE) ×2 IMPLANT
GLOVE SURG ENC MOIS LTX SZ6 (GLOVE) ×2 IMPLANT
GLOVE SURG UNDER LTX SZ6.5 (GLOVE) ×8 IMPLANT
GOWN STRL REUS W/ TWL LRG LVL3 (GOWN DISPOSABLE) ×3 IMPLANT
GOWN STRL REUS W/TWL LRG LVL3 (GOWN DISPOSABLE) ×3
IRRIGATION STRYKERFLOW (MISCELLANEOUS) IMPLANT
IRRIGATOR STRYKERFLOW (MISCELLANEOUS) ×2
KIT TURNOVER KIT B (KITS) ×2 IMPLANT
LIGASURE VESSEL 5MM BLUNT TIP (ELECTROSURGICAL) IMPLANT
NDL INSUFFLATION 14GA 120MM (NEEDLE) ×1 IMPLANT
NEEDLE INSUFFLATION 14GA 120MM (NEEDLE) ×2 IMPLANT
PACK LAPAROSCOPY BASIN (CUSTOM PROCEDURE TRAY) ×2 IMPLANT
PACK TRENDGUARD 450 HYBRID PRO (MISCELLANEOUS) IMPLANT
PACK TRENDGUARD 600 HYBRD PROC (MISCELLANEOUS) IMPLANT
PAD ABD 7.5X8 STRL (GAUZE/BANDAGES/DRESSINGS) ×1 IMPLANT
POUCH LAPAROSCOPIC INSTRUMENT (MISCELLANEOUS) ×2 IMPLANT
POUCH SPECIMEN RETRIEVAL 10MM (ENDOMECHANICALS) ×1 IMPLANT
PROTECTOR NERVE ULNAR (MISCELLANEOUS) ×4 IMPLANT
SEALER TISSUE G2 CVD JAW 35 (ENDOMECHANICALS) IMPLANT
SEALER TISSUE G2 CVD JAW 45CM (ENDOMECHANICALS) ×1
SHEARS HARMONIC ACE PLUS 36CM (ENDOMECHANICALS) IMPLANT
SLEEVE ADV FIXATION 5X100MM (TROCAR) IMPLANT
SUT VICRYL 0 UR6 27IN ABS (SUTURE) IMPLANT
SUT VICRYL 4-0 PS2 18IN ABS (SUTURE) ×2 IMPLANT
SYR 30ML LL (SYRINGE) IMPLANT
SYSTEM CARTER THOMASON II (TROCAR) ×1 IMPLANT
TOWEL GREEN STERILE FF (TOWEL DISPOSABLE) ×4 IMPLANT
TRAY FOLEY W/BAG SLVR 14FR (SET/KITS/TRAYS/PACK) ×2 IMPLANT
TRENDGUARD 450 HYBRID PRO PACK (MISCELLANEOUS) ×2
TRENDGUARD 600 HYBRID PROC PK (MISCELLANEOUS)
TROCAR ADV FIXATION 5X100MM (TROCAR) ×2 IMPLANT
TROCAR XCEL NON-BLD 11X100MML (ENDOMECHANICALS) IMPLANT
TROCAR XCEL NON-BLD 5MMX100MML (ENDOMECHANICALS) ×4 IMPLANT
WARMER LAPAROSCOPE (MISCELLANEOUS) ×2 IMPLANT

## 2021-01-22 NOTE — ED Notes (Signed)
Pt having lower abdominal pain severe, is [redacted] weeks pregnant with history of ectopic pregnancy.  Pain 10/10

## 2021-01-22 NOTE — ED Notes (Signed)
Pt up to [facility] with assist from EMT.  Pt had vagal episode, when getting up to [facility].  Pt had been put back onto stretcher when this nurse arrived.  BP 98/78.  Pt slightly groggy.  IVF initiated.  Held Fentanyl.  Dr. Dalene Seltzer notified.  No new orders.

## 2021-01-22 NOTE — Anesthesia Procedure Notes (Addendum)
Procedure Name: Intubation Date/Time: 01/22/2021 5:04 PM Performed by: Janene Harvey, CRNA Pre-anesthesia Checklist: Patient identified, Emergency Drugs available, Suction available and Patient being monitored Patient Re-evaluated:Patient Re-evaluated prior to induction Oxygen Delivery Method: Circle system utilized Preoxygenation: Pre-oxygenation with 100% oxygen Induction Type: IV induction, Rapid sequence and Cricoid Pressure applied Laryngoscope Size: Miller and 2 Grade View: Grade II Tube type: Oral Tube size: 7.0 mm Number of attempts: 1 Airway Equipment and Method: Stylet and Oral airway Placement Confirmation: ETT inserted through vocal cords under direct vision, positive ETCO2 and breath sounds checked- equal and bilateral Secured at: 21 cm Tube secured with: Tape Dental Injury: Teeth and Oropharynx as per pre-operative assessment  Comments: DL x1 with MAC 4 by CRNA, grade III view, unable to pass ett. DL x1 with Miller 2 by MDA grade II view, successful placement.

## 2021-01-22 NOTE — Transfer of Care (Signed)
Immediate Anesthesia Transfer of Care Note  Patient: Michelle Macias  Procedure(s) Performed: DIAGNOSTIC LAPAROSCOPY WITH REMOVAL OF ECTOPIC PREGNANCY (Abdomen)  Patient Location: PACU  Anesthesia Type:General  Level of Consciousness: drowsy and patient cooperative  Airway & Oxygen Therapy: Patient Spontanous Breathing  Post-op Assessment: Report given to RN and Post -op Vital signs reviewed and stable  Post vital signs: Reviewed and stable  Last Vitals:  Vitals Value Taken Time  BP    Temp    Pulse 123 01/22/21 1804  Resp 27 01/22/21 1804  SpO2 99 % 01/22/21 1804  Vitals shown include unvalidated device data.  Last Pain:  Vitals:   01/22/21 1512  TempSrc:   PainSc: 8          Complications: No notable events documented.

## 2021-01-22 NOTE — MAU Note (Signed)
Transport arrived to take patient to main OR.

## 2021-01-22 NOTE — Anesthesia Postprocedure Evaluation (Signed)
Anesthesia Post Note  Patient: Michelle Macias  Procedure(s) Performed: DIAGNOSTIC LAPAROSCOPY WITH REMOVAL OF ECTOPIC PREGNANCY (Abdomen)     Patient location during evaluation: PACU Anesthesia Type: General Level of consciousness: awake and alert Pain management: pain level controlled Vital Signs Assessment: post-procedure vital signs reviewed and stable Respiratory status: spontaneous breathing, nonlabored ventilation and respiratory function stable Cardiovascular status: stable, blood pressure returned to baseline and tachycardic Anesthetic complications: no   No notable events documented.  Last Vitals:  Vitals:   01/22/21 1815 01/22/21 1830  BP: (!) 94/52 101/80  Pulse: (!) 116 (!) 106  Resp: 20 12  Temp:    SpO2: 100% 100%    Last Pain:  Vitals:   01/22/21 1830  TempSrc:   PainSc: 3                  Beryle Lathe

## 2021-01-22 NOTE — H&P (Signed)
Michelle Macias is an 31 y.o. female. [id number] here for ectopic pregnancy concerning for new onset rupture transferred from [facility]. She had one syncopal episode while in route. She notes new onset shoulder pain since arriving here and her abdominal pain is worsening. She denies vaginal bleeding. She notes a history of ectopic pregnancies in the past that have been able to be managed with methotrexate.   In care everywhere, her quants were as follows:  12/26: 38 12/26: 84  Historically, rh positive - O+/-  Pertinent Gynecological History: Menses:  regular Previous GYN Procedures:  ablation of endometriosis, tubal ligation followed by reanastamosis   OB History: [id number]  Menstrual History: Patient's last menstrual period was 12/09/2020 (approximate).    Past Medical History:  Diagnosis Date   Anemia    Ectopic pregnancy    H/O hernia repair    2 hernia repairs 1 umbilical & 1 groin   Hx of thrombocytopenia    in pregnancy   Hypertension    managed with lifestyle modification   Preterm labor    Tension headache     Past Surgical History:  Procedure Laterality Date   ENDOMETRIAL ABLATION     HERNIA REPAIR     Inguinal 31 yo,, groin   HERNIA REPAIR  2017   umbilical    LAPAROSCOPIC TUBAL LIGATION  12/08/2011   Procedure: LAPAROSCOPIC TUBAL LIGATION;  Surgeon: Catalina Antigua, MD;  Location: [facility];  Service: Gynecology;  Laterality: Bilateral;   tubal reanastamosis     UMBILICAL HERNIA REPAIR  2016   also had previous umbilical hernia repair    Family History  Problem Relation Age of Onset   Cancer Maternal Grandmother        breast cancer   Diabetes Maternal Grandfather    Hypertension Mother    Hyperlipidemia Mother    Other Neg Hx    Colon cancer Neg Hx    Esophageal cancer Neg Hx     Social History:  reports that she has never smoked. She has never used smokeless tobacco. She reports that she does not drink alcohol and does not use drugs. Husband is at bedside  - Michelle Macias.   Allergies:  Allergies  Allergen Reactions   Contrast Media [Iodinated Contrast Media] Anaphylaxis    Started today 05/06/15    Medications Prior to Admission  Medication Sig Dispense Refill Last Dose   hydrochlorothiazide (HYDRODIURIL) 12.5 MG tablet TAKE 1 TABLET (12.5 MG TOTAL) BY MOUTH DAILY. APPT FOR FURTHER REFILLS 7 tablet 0 01/21/2021   Multiple Vitamin (MULTIVITAMIN) tablet Take 1 tablet by mouth daily.   01/21/2021   albuterol (VENTOLIN HFA) 108 (90 Base) MCG/ACT inhaler Inhale 2 puffs into the lungs every 4 (four) hours as needed for wheezing or shortness of breath. 8 g 2    escitalopram (LEXAPRO) 10 MG tablet Take 1 tablet (10 mg total) by mouth daily. 90 tablet 1    pantoprazole (PROTONIX) 40 MG tablet Take 1 tablet (40 mg total) by mouth daily. 30 tablet 0    sucralfate (CARAFATE) 1 g tablet Take 1 tablet (1 g total) by mouth 4 (four) times daily. 120 tablet 0     Review of Systems  Constitutional: Negative.   HENT: Negative.    Eyes: Negative.   Respiratory: Negative.    Cardiovascular: Negative.   Gastrointestinal:  Positive for abdominal distention, abdominal pain and nausea. Negative for vomiting.  Endocrine: Negative.   Genitourinary: Negative.   Musculoskeletal: Negative.  Right shoulder pain  Skin: Negative.   Neurological: Negative.   Hematological: Negative.   Psychiatric/Behavioral: Negative.     Blood pressure 108/71, pulse (!) 111, temperature 98.7 F (37.1 C), temperature source Oral, resp. rate 12, height 4\' 11"  (1.499 m), weight 70.3 kg, last menstrual period 12/09/2020, SpO2 100 %. Physical Exam Constitutional:      General: She is in acute distress.     Appearance: She is well-developed and normal weight.  HENT:     Head: Normocephalic and atraumatic.  Eyes:     Extraocular Movements: Extraocular movements intact.  Cardiovascular:     Rate and Rhythm: Normal rate and regular rhythm.  Pulmonary:     Effort: Pulmonary effort is  normal.  Abdominal:     General: Abdomen is protuberant. There is distension.     Palpations: Abdomen is soft.     Tenderness: There is generalized abdominal tenderness.  Skin:    General: Skin is warm.  Neurological:     General: No focal deficit present.     Mental Status: She is alert and oriented to person, place, and time.  Psychiatric:        Mood and Affect: Mood normal.        Behavior: Behavior normal.    Results for orders placed or performed during the hospital encounter of 01/22/21 (from the past 24 hour(s))  CBC with Differential     Status: None   Collection Time: 01/22/21 12:05 PM  Result Value Ref Range   WBC 7.7 4.0 - 10.5 K/uL   RBC 4.33 3.87 - 5.11 MIL/uL   Hemoglobin 13.2 12.0 - 15.0 g/dL   HCT 03/22/21 71.6 - 96.7 %   MCV 88.0 80.0 - 100.0 fL   MCH 30.5 26.0 - 34.0 pg   MCHC 34.6 30.0 - 36.0 g/dL   RDW 89.3 81.0 - 17.5 %   Platelets 203 150 - 400 K/uL   nRBC 0.0 0.0 - 0.2 %   Neutrophils Relative % 54 %   Neutro Abs 4.1 1.7 - 7.7 K/uL   Lymphocytes Relative 37 %   Lymphs Abs 2.9 0.7 - 4.0 K/uL   Monocytes Relative 8 %   Monocytes Absolute 0.6 0.1 - 1.0 K/uL   Eosinophils Relative 0 %   Eosinophils Absolute 0.0 0.0 - 0.5 K/uL   Basophils Relative 0 %   Basophils Absolute 0.0 0.0 - 0.1 K/uL   Immature Granulocytes 1 %   Abs Immature Granulocytes 0.05 0.00 - 0.07 K/uL  Comprehensive metabolic panel     Status: Abnormal   Collection Time: 01/22/21 12:05 PM  Result Value Ref Range   Sodium 135 135 - 145 mmol/L   Potassium 3.6 3.5 - 5.1 mmol/L   Chloride 101 98 - 111 mmol/L   CO2 23 22 - 32 mmol/L   Glucose, Bld 100 (H) 70 - 99 mg/dL   BUN 7 6 - 20 mg/dL   Creatinine, Ser 03/22/21 0.44 - 1.00 mg/dL   Calcium 9.2 8.9 - 5.85 mg/dL   Total Protein 8.0 6.5 - 8.1 g/dL   Albumin 4.3 3.5 - 5.0 g/dL   AST 48 (H) 15 - 41 U/L   ALT 58 (H) 0 - 44 U/L   Alkaline Phosphatase 75 38 - 126 U/L   Total Bilirubin 0.5 0.3 - 1.2 mg/dL   GFR, Estimated 27.7 >82 mL/min    Anion gap 11 5 - 15  hCG, quantitative, pregnancy     Status: Abnormal  Collection Time: 01/22/21 12:05 PM  Result Value Ref Range   hCG, Beta Chain, Quant, S 776 (H) <5 mIU/mL  Urinalysis, Routine w reflex microscopic Urine, Clean Catch     Status: None   Collection Time: 01/22/21 12:05 PM  Result Value Ref Range   Color, Urine YELLOW YELLOW   APPearance CLEAR CLEAR   Specific Gravity, Urine 1.010 1.005 - 1.030   pH 6.0 5.0 - 8.0   Glucose, UA NEGATIVE NEGATIVE mg/dL   Hgb urine dipstick NEGATIVE NEGATIVE   Bilirubin Urine NEGATIVE NEGATIVE   Ketones, ur NEGATIVE NEGATIVE mg/dL   Protein, ur NEGATIVE NEGATIVE mg/dL   Nitrite NEGATIVE NEGATIVE   Leukocytes,Ua NEGATIVE NEGATIVE  Resp Panel by RT-PCR (Flu A&B, Covid) Nasopharyngeal Swab     Status: None   Collection Time: 01/22/21  2:16 PM   Specimen: Nasopharyngeal Swab; Nasopharyngeal(NP) swabs in vial transport medium  Result Value Ref Range   SARS Coronavirus 2 by RT PCR NEGATIVE NEGATIVE   Influenza A by PCR NEGATIVE NEGATIVE   Influenza B by PCR NEGATIVE NEGATIVE    US OB LESS THAN 14 WEEKS WITH OB TRANSVAGINAL  Result Date: 01/22/2021 CLINICAL DATA:  Pelvic pain. History of prior ectopic pregnancy. Estimated gestational age of [redacted] weeks, 3 days by LMP. EXAM: OBSTETRIC <14 WK US AND TRANSVAGINAL OB US TECHNIQUE: Both transabdominal and transvaginal ultrasound examinations were performed for complete evaluation of the gestation as well as the maternal uterus, adnexal regions, and pelvic cul-de-sac. Transvaginal technique was performed to assess early pregnancy. COMPARISON:  Pelvic ultrasound dated October 27, 2017. FINDINGS: Intrauterine gestational sac: None. Maternal uterus/adnexae: Complex left adnexal mass measuring 10.1 x 3.8 x 4.1 cm, difficult to separate from the left ovary. The right ovary is unremarkable. There is a small amount of complex fluid surrounding the right ovary. IMPRESSION: 1. No intrauterine pregnancy. Complex  left adnexal mass concerning for ectopic pregnancy. 2. Small amount of complex fluid surrounding the right ovary, of uncertain significance. Ruptured ectopic pregnancy is a possibility, but there is no additional evident free fluid in the pelvis. Critical Value/emergent results were called by telephone at the time of interpretation on 01/22/2021 at 1:40 pm to provider [facility] , who verbally acknowledged these results. Electronically Signed   By: Obie DredgeWilliam T Derry M.D.   On: 01/22/2021 13:46    Assessment/Plan: Rupture ectopic Pregnancy - Suspect a recent rupture given her symptoms and hemoglobin - Will recheck CBC while on the way to OR - T&S collected but historically Rh pos - Recommended L/S removal of ectopic pregnancy - Discussed risks of surgery - bleeding, infection, injury to surrounding organs/tissues, need for additional surgery, conversion to open surgery. Consent reviewed and signed and patient signed the consent.    Milas [place] W[place]na Saks 01/22/2021, 3:46 PM

## 2021-01-22 NOTE — Anesthesia Preprocedure Evaluation (Addendum)
Anesthesia Evaluation  Patient identified by MRN, date of birth, ID band Patient awake    Reviewed: Allergy & Precautions, NPO status , Patient's Chart, lab work & pertinent test results  History of Anesthesia Complications Negative for: history of anesthetic complications  Airway Mallampati: III  TM Distance: >3 FB Neck ROM: Full    Dental  (+) Dental Advisory Given, Teeth Intact   Pulmonary neg pulmonary ROS,    Pulmonary exam normal        Cardiovascular hypertension, Pt. on medications Normal cardiovascular exam     Neuro/Psych  Headaches, PSYCHIATRIC DISORDERS Anxiety Depression    GI/Hepatic Neg liver ROS, PUD,   Endo/Other   Obesity   Renal/GU negative Renal ROS     Musculoskeletal negative musculoskeletal ROS (+)   Abdominal   Peds  Hematology  (+) anemia ,   Anesthesia Other Findings   Reproductive/Obstetrics  Ectopic pregnancy                             Anesthesia Physical Anesthesia Plan  ASA: 2 and emergent  Anesthesia Plan: General   Post-op Pain Management: Toradol IV (intra-op)   Induction: Intravenous and Rapid sequence  PONV Risk Score and Plan: 4 or greater and Treatment may vary due to age or medical condition, Ondansetron, Midazolam, Dexamethasone and Scopolamine patch - Pre-op  Airway Management Planned: Oral ETT  Additional Equipment: None  Intra-op Plan:   Post-operative Plan: Extubation in OR  Informed Consent: I have reviewed the patients History and Physical, chart, labs and discussed the procedure including the risks, benefits and alternatives for the proposed anesthesia with the patient or authorized representative who has indicated his/her understanding and acceptance.     Dental advisory given  Plan Discussed with: CRNA and Anesthesiologist  Anesthesia Plan Comments:        Anesthesia Quick Evaluation

## 2021-01-22 NOTE — Op Note (Signed)
Dereck Ligas PROCEDURE DATE: 01/22/2021  PREOPERATIVE DIAGNOSIS: Ruptured ectopic pregnancy POSTOPERATIVE DIAGNOSIS: Ruptured left fallopian tube ectopic pregnancy PROCEDURE: Laparoscopic left salpingectomy and removal of ectopic pregnancy SURGEON:  Dr. Radene Gunning ANESTHESIOLOGY TEAM: Anesthesiologist: Audry Pili, MD CRNA: Lance Coon, CRNA; Janene Harvey, CRNA  INDICATIONS: 31 y.o. [phone] at Unknown here with the preoperative diagnoses as listed above.  Please refer to preoperative notes for more details. Patient was counseled regarding need for laparoscopic salpingectomy. Risks of surgery including bleeding which may require transfusion or reoperation, infection, injury to bowel or other surrounding organs, need for additional procedures including laparotomy and other postoperative/anesthesia complications were explained to patient.  Written informed consent was obtained.  FINDINGS:  500cc amount of hemoperitoneum of blood and clots.  Dilated left fallopian tube containing ectopic gestation. Small normal appearing uterus, normal right fallopian tube, right ovary and left ovary.  ANESTHESIA: General, 1% bupivicaine local INTRAVENOUS FLUIDS: 1700 ml crystalloid, 250 cc albumin ESTIMATED BLOOD LOSS: 5 ml URINE OUTPUT: 400 ml SPECIMENS: Left fallopian tube containing ectopic gestation COMPLICATIONS: None immediate  PROCEDURE IN DETAIL:  The patient was taken to the operating room where general anesthesia was administered and was found to be adequate.  She was placed in the dorsal lithotomy position, and was prepped and draped in a sterile manner.  A Foley catheter was inserted into her bladder and attached to constant drainage and a uterine manipulator was then advanced into the uterus .    She was prepped and draped in the usual sterile fashion in the dorsal lithotomy position. A skin incision was made with the 11 blade scalpel in the umbilicus. I entered using the closed  technique with the Veress needle. Opening pressure was 2.   Pneumoperitoneum achieved to a pressure of 15. A 5 mm Optiview port with the camera was used to enter the cavity under direct visualization. Below the point of entry and the pelvis was inspected and there was no evidence of injury. Hemoperitoneum noted in the pelvis. She was placed in steep Trendelenburg.  The scalpel was then used to make two incisions, one in the suprapubic area (10 mm) and one in the LLQ (57mm).  Corresponding ports were inserted under direct visualization.   The Nezhat suction irrigator was then used to suction the hemoperitoneum and irrigate the pelvis.  Attention was then turned to the left fallopian tube which was grasped and ligated from the underlying mesosalpinx and uterine attachment using the Enseal instrument.  Good hemostasis was noted.  The specimen was placed in an EndoCatch bag and removed from the abdomen intact.  The abdomen was desufflated, and all instruments were removed.  The fascia was closed with 0 vicryl in the 10 mm incision.   The incisions were then closed in a subcuticular fashion with 4-0 monocryl and dermabond was placed. Skin was numbed with a total of 15 cc 0.25% local.   She was taken to recovery in stable condition.  Rh status: positive   The patient will be discharged to home as per PACU criteria.  Routine postoperative instructions given.    Radene Gunning MD, [place] for [employer], [place]

## 2021-01-22 NOTE — ED Triage Notes (Addendum)
Pt c/o lower abd pain started 12/30-was seen by OB/GYN 12/30-has [place] scheduled for 1/6-pain worse this am-denies vaginal d/c or bleeding-to triage in w/c, grimacing

## 2021-01-22 NOTE — ED Notes (Signed)
Pt feeling slightly better.  EMS here for transport.

## 2021-01-22 NOTE — MAU Note (Addendum)
...  TRELLIS GUIRGUIS is a 31 y.o. at Unknown here in MAU reporting: New onset lower middle abdominal pain that is intermittent. She describes the pain as pressure. Also endorsing new onset right shoulder pain that began when being picked up by EMS at [facility]. No VB or LOF.  8/10 mid lower abdominal pain 8/10 right shoulder pain

## 2021-01-23 ENCOUNTER — Encounter (HOSPITAL_COMMUNITY): Payer: Self-pay | Admitting: Obstetrics and Gynecology

## 2021-01-24 ENCOUNTER — Other Ambulatory Visit: Payer: Self-pay

## 2021-01-24 ENCOUNTER — Encounter: Payer: Self-pay | Admitting: Physician Assistant

## 2021-01-24 LAB — TYPE AND SCREEN
ABO/RH(D): O POS
Antibody Screen: NEGATIVE
Unit division: 0
Unit division: 0

## 2021-01-24 LAB — BPAM RBC
Blood Product Expiration Date: 202301252359
Blood Product Expiration Date: 202301252359
Unit Type and Rh: 5100
Unit Type and Rh: 5100

## 2021-01-24 LAB — SURGICAL PATHOLOGY

## 2021-01-24 LAB — URINE CULTURE: Culture: 80000 — AB

## 2021-01-27 ENCOUNTER — Telehealth: Payer: Self-pay | Admitting: General Practice

## 2021-01-27 NOTE — Telephone Encounter (Signed)
Transition Care Management Follow-up Telephone Call Date of discharge and from where: 01/26/21 from [facility] How have you been since you were released from the hospital? Doing ok.  Any questions or concerns? No  Items Reviewed: Did the pt receive and understand the discharge instructions provided? No  Medications obtained and verified? No  Other? No  Any new allergies since your discharge? No  Dietary orders reviewed? Yes Do you have support at home? Yes   Home Care and Equipment/Supplies: Were home health services ordered? no   Functional Questionnaire: (I = Independent and D = Dependent) ADLs: I  Bathing/Dressing- I  Meal Prep- I  Eating- I  Maintaining continence- I  Transferring/Ambulation- I  Managing Meds- I  Follow up appointments reviewed:  PCP Hospital f/u appt confirmed? No   Specialist Hospital f/u appt confirmed?  Patient has left a message for the surgeon's office to make an appt.   Are transportation arrangements needed? No  If their condition worsens, is the pt aware to call PCP or go to the Emergency Dept.? Yes Was the patient provided with contact information for the PCP's office or ED? Yes Was to pt encouraged to call back with questions or concerns? Yes

## 2021-01-31 NOTE — ED Provider Notes (Signed)
[facility] Provider Note   CSN: [id number] Arrival date & time: 01/22/21  1130     History  Chief Complaint  Patient presents with   Abdominal Pain    Michelle Macias is a 31 y.o. female.  HPI      30yo [id number] at less than 8wk by LMP presents with concern for abdominal pain.  Started 12/30, since then has been worsened and this AM seemed to suddenly get worse.  Severe colicky pain located primarily on the right with some radiation towards flank.  It is worse with movement. On initial hx denies lightheadedness. Notes nausea. No diarrhea or constipation. No fevers. Appetite has been ok. No urinary symptoms.  No VB.  Hx of ectopic pregnancy. Feels similar. Hx of kidney stones and feels somewhat like that as well.     Past Medical History:  Diagnosis Date   Anemia    Ectopic pregnancy    H/O hernia repair    2 hernia repairs 1 umbilical & 1 groin   Hx of thrombocytopenia    in pregnancy   Hypertension    managed with lifestyle modification   Preterm labor    Tension headache     Past Surgical History:  Procedure Laterality Date   ABLATION ON ENDOMETRIOSIS     DIAGNOSTIC LAPAROSCOPY WITH REMOVAL OF ECTOPIC PREGNANCY N/A 01/22/2021   Procedure: DIAGNOSTIC LAPAROSCOPY WITH REMOVAL OF ECTOPIC PREGNANCY;  Surgeon: Milas Hockuncan, Paula, MD;  [facility] OR;  Service: Gynecology;  Laterality: N/A;   HERNIA REPAIR     Inguinal 31 yo,, groin   HERNIA REPAIR  2017   umbilical    LAPAROSCOPIC TUBAL LIGATION  12/08/2011   Procedure: LAPAROSCOPIC TUBAL LIGATION;  Surgeon: Catalina AntiguaPeggy Constant, MD;  Location: [facility] ORS;  Service: Gynecology;  Laterality: Bilateral;   tubal reanastamosis     UMBILICAL HERNIA REPAIR  2016   also had previous umbilical hernia repair    Home Medications Prior to Admission medications   Medication Sig Start Date End Date Taking? Authorizing Provider  hydrochlorothiazide (HYDRODIURIL) 12.5 MG tablet TAKE 1 TABLET (12.5 MG TOTAL) BY MOUTH DAILY.  APPT FOR FURTHER REFILLS 03/13/20  Yes Breeback, Jade L, PA-C  ibuprofen (ADVIL) 600 MG tablet Take 1 tablet (600 mg total) by mouth every 6 (six) hours as needed. 01/22/21  Yes Milas Hockuncan, Paula, MD  Multiple Vitamin (MULTIVITAMIN) tablet Take 1 tablet by mouth daily.   Yes [provider]  oxyCODONE (OXY IR/ROXICODONE) 5 MG immediate release tablet Take 1 tablet (5 mg total) by mouth every 4 (four) hours as needed for severe pain or breakthrough pain. 01/22/21  Yes Milas Hockuncan, Paula, MD  albuterol (VENTOLIN HFA) 108 (90 Base) MCG/ACT inhaler Inhale 2 puffs into the lungs every 4 (four) hours as needed for wheezing or shortness of breath. 03/05/20   Breeback, Jade L, PA-C  escitalopram (LEXAPRO) 10 MG tablet Take 1 tablet (10 mg total) by mouth daily. 07/16/20   Christen ButterJessup, Joy, NP  pantoprazole (PROTONIX) 40 MG tablet Take 1 tablet (40 mg total) by mouth daily. 09/18/20   Breeback, Jade L, PA-C  sucralfate (CARAFATE) 1 g tablet Take 1 tablet (1 g total) by mouth 4 (four) times daily. 09/18/20   Breeback, Lesly RubensteinJade L, PA-C      Allergies    Contrast media [iodinated contrast media]    Review of Systems   Review of Systems  Physical Exam Updated Vital Signs BP 101/80 (BP Location: Right Arm)    Pulse Marland Kitchen(!)  106    Temp 98.5 F (36.9 C)    Resp 12    Ht 4\' 11"  (1.499 m)    Wt 70.3 kg    LMP 12/09/2020 (Approximate)    SpO2 100%    BMI 31.31 kg/m  Physical Exam Vitals and nursing note reviewed.  Constitutional:      General: She is not in acute distress.    Appearance: Normal appearance. She is not ill-appearing, toxic-appearing or diaphoretic.  HENT:     Head: Normocephalic.  Eyes:     Conjunctiva/sclera: Conjunctivae normal.  Cardiovascular:     Rate and Rhythm: Normal rate and regular rhythm.     Pulses: Normal pulses.  Pulmonary:     Effort: Pulmonary effort is normal. No respiratory distress.  Abdominal:     Tenderness: There is abdominal tenderness in the right lower quadrant and suprapubic area.   Musculoskeletal:        General: No deformity or signs of injury.     Cervical back: No rigidity.  Skin:    General: Skin is warm and dry.     Coloration: Skin is not jaundiced or pale.  Neurological:     General: No focal deficit present.     Mental Status: She is alert and oriented to person, place, and time.    ED Results / Procedures / Treatments   Labs (all labs ordered are listed, but only abnormal results are displayed) Labs Reviewed  URINE CULTURE - Abnormal; Notable for the following components:      Result Value   Culture   (*)    Value: 80,000 COLONIES/mL LACTOBACILLUS SPECIES Standardized susceptibility testing for this organism is not available. Performed at [facility], [address] [place], [place]    All other components within normal limits  COMPREHENSIVE METABOLIC PANEL - Abnormal; Notable for the following components:   Glucose, Bld 100 (*)    AST 48 (*)    ALT 58 (*)    All other components within normal limits  HCG, QUANTITATIVE, PREGNANCY - Abnormal; Notable for the following components:   hCG, Beta Chain, Quant, S 776 (*)    All other components within normal limits  CBC - Abnormal; Notable for the following components:   WBC 13.5 (*)    RBC 3.63 (*)    Hemoglobin 10.7 (*)    HCT 32.9 (*)    All other components within normal limits  RESP PANEL BY RT-PCR (FLU A&B, COVID) ARPGX2  CBC WITH DIFFERENTIAL/PLATELET  URINALYSIS, ROUTINE W REFLEX MICROSCOPIC  TYPE AND SCREEN  PREPARE RBC (CROSSMATCH)  ABO/RH  SURGICAL PATHOLOGY    EKG None  Radiology No results found.  Procedures .Critical Care Performed by: [id number], MD Authorized by: Alvira Monday, MD   Critical care provider statement:    Critical care time (minutes):  30   Critical care was time spent personally by me on the following activities:  Development of treatment plan with patient or surrogate, discussions with consultants, evaluation of patient's  response to treatment, examination of patient, ordering and review of laboratory studies, ordering and review of radiographic studies, ordering and performing treatments and interventions, pulse oximetry, re-evaluation of patient's condition and review of old charts    Medications Ordered in ED Medications  lactated ringers bolus 1,000 mL (0 mLs Intravenous Stopped 01/22/21 1333)  morphine 4 MG/ML injection 4 mg (4 mg Intravenous Given 01/22/21 1222)  lactated ringers bolus 1,000 mL (1,000 mLs Intravenous New Bag/Given  01/22/21 1415)  chlorhexidine (PERIDEX) 0.12 % solution 15 mL (15 mLs Mouth/Throat Given 01/22/21 1635)    Or  MEDLINE mouth rinse ( Mouth Rinse See Alternative 01/22/21 1635)    ED Course/ Medical Decision Making/ A&P                           Medical Decision Making Amount and/or Complexity of Data Reviewed Labs: ordered. Radiology: ordered.  Risk Prescription drug management.    30yo [id number] at less than 8wk by LMP  presents with concern for abdominal pain.    DDx includes ectopic pregnancy, torsion, UTI, nephrolithiasis, appendicitis. Outpatient 48hr hcg noted to have not appropriate rise.  TVUS ordered and evaluated and interpreted by me and radiology shows no IUP, complex left adnexal mass concerning for ectopic pregnancy and free fluid surrounding the right ovary.    Discussed with OBGYN, will transfer with concern for ruptured ectopic pregnancy. Just prior to transfer had a syncopal event. BP stable after this, do not feel unmatched blood available at the facility appropriate and most appropriate plan is to continue transfer to [facility] for OBGYN evaluation.         Final Clinical Impression(s) / ED Diagnoses Final diagnoses:  Pelvic pain  Reactive airways dysfunction syndrome (HCC)    Rx / DC Orders ED Discharge Orders          Ordered    oxyCODONE (OXY IR/ROXICODONE) 5 MG immediate release tablet  Every 4 hours PRN        01/22/21 1832    ibuprofen  (ADVIL) 600 MG tablet  Every 6 hours PRN        01/22/21 1832    Activity as tolerated - No restrictions        01/22/21 1832    May shower / Bathe        01/22/21 1832    Driving Restrictions       Comments: When not taking narcotic pain medication and would not hesitate to use the breaks, usually about 7 days   01/22/21 1832    Diet - low sodium heart healthy        01/22/21 1832    Discharge wound care:       Comments: If dressing is present, remove after 7 days from surgery   01/22/21 1832    Call MD for:  temperature >100.4        01/22/21 1832    Call MD for:  persistant nausea and vomiting        01/22/21 1832    Call MD for:  severe uncontrolled pain        01/22/21 1832    Call MD for:  redness, tenderness, or signs of infection (pain, swelling, redness, odor or green/yellow discharge around incision site)        01/22/21 1832    Call MD for:  difficulty breathing, headache or visual disturbances        01/22/21 1832              Alvira Monday, MD 02/08/21 1329

## 2021-06-05 NOTE — Progress Notes (Unsigned)
ANNUAL EXAM Patient name: Michelle Macias MRN [id number]  Date of birth: 1990/11/24 Chief Complaint:   No chief complaint on file.  History of Present Illness:   Michelle Macias is a 31 y.o. [phone] female being seen today for a routine annual exam.   Current complaints: ***  She had ruptured ectopic in January for which I did her laparoscopy.   Patient's last menstrual period was 12/09/2020 (approximate).   The pregnancy intention screening data noted above was reviewed. Potential methods of contraception were discussed. The patient elected to proceed with No data recorded.   Last pap No recent pap on file in [facility].       07/16/2020    9:20 AM 10/04/2018    8:38 AM 12/09/2016    9:13 AM 12/09/2016    9:11 AM  Depression screen PHQ 2/9  Decreased Interest 1 3 1 1   Down, Depressed, Hopeless 0 1 2 2   PHQ - 2 Score 1 4 3 3   Altered sleeping 1 2 2 2   Tired, decreased energy 1 3 2 2   Change in appetite 1 3 1 1   Feeling bad or failure about yourself  0 3 2 2   Trouble concentrating 1 1 1 1   Moving slowly or fidgety/restless 0 3 2 2   Suicidal thoughts 0 0 1 1  PHQ-9 Score 5 19 14 14   Difficult doing work/chores Somewhat difficult Somewhat difficult Very difficult         07/16/2020    9:22 AM 10/04/2018    8:39 AM 12/09/2016    9:13 AM 12/09/2016    9:12 AM  GAD 7 : Generalized Anxiety Score  Nervous, Anxious, on Edge 1 3 2 2   Control/stop worrying 3 3 1 1   Worry too much - different things 3 3 2 2   Trouble relaxing 3 3 2 2   Restless 3 3 2 2   Easily annoyed or irritable 3 3 3 3   Afraid - awful might happen 0 1 1 1   Total GAD 7 Score 16 19 13 13   Anxiety Difficulty  Somewhat difficult       Review of Systems:   Pertinent items are noted in HPI Denies any headaches, blurred vision, fatigue, shortness of breath, chest pain, abdominal pain, abnormal vaginal discharge/itching/odor/irritation, problems with periods, bowel movements, urination, or  intercourse unless otherwise stated above. *** Pertinent History Reviewed:  Reviewed past medical,surgical, social and family history.  Reviewed problem list, medications and allergies. Physical Assessment:  There were no vitals filed for this visit.There is no height or weight on file to calculate BMI.   Physical Examination:  General appearance - well appearing, and in no distress Mental status - alert, oriented to person, place, and time Psych:  She has a normal mood and affect Skin - warm and dry, normal color, no suspicious lesions noted Chest - effort normal, all lung fields clear to auscultation bilaterally Heart - normal rate and regular rhythm Neck:  midline trachea, no thyromegaly or nodules Breasts - breasts appear normal, no suspicious masses, no skin or nipple changes or  axillary nodes Abdomen - soft, nontender, nondistended, no masses or organomegaly Pelvic -  VULVA: normal appearing vulva with no masses, tenderness or lesions   VAGINA: normal appearing vagina with normal color and discharge, no lesions   CERVIX: normal appearing cervix without discharge or lesions, no CMT UTERUS: uterus is felt to be normal size, shape, consistency and nontender  ADNEXA: No adnexal masses or tenderness noted.  Extremities:  No swelling or varicosities noted  Chaperone present for exam  No results found for this or any previous visit (from the past 24 hour(s)).  Assessment & Plan:  Diagnoses and all orders for this visit:  Encounter for annual routine gynecological examination  - Cervical cancer screening: Discussed guidelines. Pap with HPV done - Gardasil: {Blank single:19197::"***","has not yet had. Will provide information","completed","has not yet had. Counseling provided and she declines","Has not yet had. Counseling provided and pt accepts"} - GC/CT: {Blank single:19197::"accepts","declines","not indicated"} - Birth Control: Discussed options and their risks, benefits, and  common side effects; discussed VTE with estrogen containing options - desires: {Birth control type:23956} - Breast Health: Encouraged self breast awareness/SBE. Teaching provided.  - F/U 12 months and prn    No orders of the defined types were placed in this encounter.   Meds: No orders of the defined types were placed in this encounter.   Follow-up: No follow-ups on file.  Milas Hock, MD 06/05/2021 1:00 PM

## 2021-06-12 ENCOUNTER — Encounter: Payer: Medicaid Other | Admitting: Obstetrics and Gynecology

## 2021-06-12 DIAGNOSIS — Z01419 Encounter for gynecological examination (general) (routine) without abnormal findings: Secondary | ICD-10-CM

## 2021-07-07 ENCOUNTER — Other Ambulatory Visit: Payer: Self-pay

## 2021-07-07 DIAGNOSIS — Z3201 Encounter for pregnancy test, result positive: Secondary | ICD-10-CM

## 2021-07-08 LAB — HCG, QUANTITATIVE, PREGNANCY: HCG, Total, QN: <5 m[IU]/mL

## 2021-07-15 NOTE — Progress Notes (Signed)
ANNUAL EXAM Patient name: Michelle Macias MRN [id number]  Date of birth: 03/07/1990 Chief Complaint:   Annual Exam  History of Present Illness:   Michelle Macias is a 31 y.o. G6P2123 female being seen today for a routine annual exam. She and I met in the hospital where she had a ruptured ectopic and did her surgery for that. She had a left salpingectomy at the time. Emotionally she has had a hard time because the surgery and everything around it was scary for her. She has not been TTC. They are using condoms at this time.   Current complaints: None otherwise.   Patient's last menstrual period was 07/08/2021 (approximate).  Last pap 5y. Results were:  normal per pt . H/O abnormal pap: no   Review of Systems:   Pertinent items are noted in HPI Denies any headaches, blurred vision, fatigue, shortness of breath, chest pain, abdominal pain, abnormal vaginal discharge/itching/odor/irritation, problems with periods, bowel movements, urination, or intercourse unless otherwise stated above.  Pertinent History Reviewed:  Reviewed past medical,surgical, social and family history.  Reviewed problem list, medications and allergies. Physical Assessment:   Vitals:   07/17/21 1539  BP: (!) 126/91  Pulse: 89  Weight: 163 lb (73.9 kg)  Height: 5' 2" (1.575 m)  Body mass index is 29.81 kg/m.   Physical Examination:  General appearance - well appearing, and in no distress Mental status - alert, oriented to person, place, and time Psych:  She has a normal mood and affect Skin - warm and dry, normal color, no suspicious lesions noted Chest - effort normal, all lung fields clear to auscultation bilaterally Heart - normal rate and regular rhythm Neck:  midline trachea, no thyromegaly or nodules Breasts - breasts appear normal, no suspicious masses, no skin or nipple changes or axillary nodes; right breast painful on exam (upon noticing this and asking her, she then reported breast pain for  the last 8 months in that breast and + fmh of breast cancer) Abdomen - soft, nontender, nondistended, no masses or organomegaly Pelvic -  VULVA: normal appearing vulva with no masses, tenderness or lesions  VAGINA: normal appearing vagina with normal color and discharge, no lesions   CERVIX: normal appearing cervix without discharge or lesions, no CMT UTERUS: uterus is felt to be normal size, shape, consistency and nontender  ADNEXA: No adnexal masses or tenderness noted. Extremities:  No swelling or varicosities noted  Chaperone present for exam  No results found for this or any previous visit (from the past 24 hour(s)).  Assessment & Plan:  Jaira was seen today for annual exam.  Diagnoses and all orders for this visit:  Encounter for annual routine gynecological examination -     Cytology - PAP( [facility]) - Cervical cancer screening: Discussed guidelines. Pap with HPV done - Gardasil: thinks she had but unsure - GC/CT: declines - Birth Control: Declines  - Breast Health: Encouraged self breast awareness/SBE. Teaching provided.  - F/U 12 months and prn  Breast pain, right -     US BREAST COMPLETE UNI RIGHT INC AXILLA; Future -     MM Digital Diagnostic Bilat; Future   Orders Placed This Encounter  Procedures   US BREAST COMPLETE UNI RIGHT INC AXILLA   MM Digital Diagnostic Bilat    Meds: No orders of the defined types were placed in this encounter.   Follow-up: No follow-ups on file.  Paula Duncan, MD 07/17/2021 4:53 PM 

## 2021-07-17 ENCOUNTER — Other Ambulatory Visit (HOSPITAL_COMMUNITY)
Admission: RE | Admit: 2021-07-17 | Discharge: 2021-07-17 | Disposition: A | Discharge status: Home / Self Care | Payer: Medicaid Other | Source: Ambulatory Visit | Attending: Obstetrics and Gynecology | Admitting: Obstetrics and Gynecology

## 2021-07-17 ENCOUNTER — Encounter: Payer: Self-pay | Admitting: Obstetrics and Gynecology

## 2021-07-17 ENCOUNTER — Ambulatory Visit: Payer: Medicaid Other | Admitting: Obstetrics and Gynecology

## 2021-07-17 VITALS — BP 126/91 | HR 89 | Ht 62.0 in | Wt 163.0 lb

## 2021-07-17 DIAGNOSIS — N644 Mastodynia: Secondary | ICD-10-CM

## 2021-07-17 DIAGNOSIS — Z01419 Encounter for gynecological examination (general) (routine) without abnormal findings: Secondary | ICD-10-CM

## 2021-07-17 DIAGNOSIS — Z0000 Encounter for general adult medical examination without abnormal findings: Secondary | ICD-10-CM

## 2021-07-21 LAB — CYTOLOGY - PAP
Comment: NEGATIVE
Diagnosis: NEGATIVE
High risk HPV: NEGATIVE

## 2021-07-23 ENCOUNTER — Other Ambulatory Visit: Payer: Self-pay | Admitting: Obstetrics and Gynecology

## 2021-07-23 DIAGNOSIS — N644 Mastodynia: Secondary | ICD-10-CM

## 2021-08-04 ENCOUNTER — Ambulatory Visit
Admission: RE | Admit: 2021-08-04 | Discharge: 2021-08-04 | Disposition: A | Discharge status: Home / Self Care | Payer: Medicaid Other | Source: Ambulatory Visit | Attending: Obstetrics and Gynecology | Admitting: Obstetrics and Gynecology

## 2021-08-04 ENCOUNTER — Ambulatory Visit: Payer: Medicaid Other

## 2021-08-04 DIAGNOSIS — N644 Mastodynia: Secondary | ICD-10-CM

## 2021-09-01 ENCOUNTER — Ambulatory Visit: Payer: Medicaid Other | Admitting: Family Medicine

## 2021-09-01 ENCOUNTER — Ambulatory Visit
Admission: EM | Admit: 2021-09-01 | Discharge: 2021-09-01 | Disposition: A | Discharge status: Home / Self Care | Payer: Medicaid Other | Attending: Family Medicine | Admitting: Family Medicine

## 2021-09-01 DIAGNOSIS — H6691 Otitis media, unspecified, right ear: Secondary | ICD-10-CM | POA: Diagnosis not present

## 2021-09-01 MED ORDER — AMOXICILLIN 875 MG PO TABS: 875.0000 mg | ORAL_TABLET | Freq: Two times a day (BID) | ORAL | 0 refills | Status: DC

## 2021-09-01 MED ORDER — PREDNISONE 20 MG PO TABS: ORAL_TABLET | ORAL | 0 refills | Status: DC

## 2021-09-01 NOTE — ED Triage Notes (Signed)
Pt presents with bilateral ear pain x 1 week.

## 2021-09-01 NOTE — Discharge Instructions (Signed)
May add Pseudoephedrine (30mg , one or two every 4 to 6 hours) for sinus congestion.  Get adequate rest.   Try using Flonase nasal spray daily.

## 2021-09-01 NOTE — ED Provider Notes (Signed)
[facility]    CSN: [id number] Arrival date & time: 09/01/21  1126      History   Chief Complaint Chief Complaint  Patient presents with   Otalgia    HPI DESHAYLA EMPSON is a 31 y.o. female.   Patient developed right earache 8 days ago without significant nasal congestion, URI symptoms, fever, ear drainage.  Her ear feel full with decreased hearing, and now her left ear is becoming uncomfortable.  The history is provided by the patient.    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   History of ectopic pregnancy 01/17/2021   COVID-19 02/25/2020   Flushing 08/15/2019   Gastric ulcer 02/27/2019   Migraine without aura and without status migrainosus, not intractable 02/22/2019   Stress at home 10/04/2018   Panic attacks 10/04/2018   Anxiety and depression 12/09/2016   Outbursts of anger 12/09/2016   Stress reaction 12/09/2016   Vitamin D deficiency 05/30/2014   Constipation 04/30/2014   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   Hx of umbilical hernia repair 04/24/2014    Past Surgical History:  Procedure Laterality Date   ABLATION ON ENDOMETRIOSIS     DIAGNOSTIC LAPAROSCOPY WITH REMOVAL OF ECTOPIC PREGNANCY N/A 01/22/2021   Procedure: DIAGNOSTIC LAPAROSCOPY WITH REMOVAL OF ECTOPIC PREGNANCY;  Surgeon: Milas Hock, MD;  Location: [facility];  Service: Gynecology;  Laterality: N/A;   HERNIA REPAIR     Inguinal 31 yo,, groin   HERNIA REPAIR  2017   umbilical    LAPAROSCOPIC TUBAL LIGATION  12/08/2011   Procedure: LAPAROSCOPIC TUBAL LIGATION;  Surgeon: Catalina Antigua, MD;  Location: [facility];  Service: Gynecology;   Laterality: Bilateral;   tubal reanastamosis     UMBILICAL HERNIA REPAIR  2016   also had previous umbilical hernia repair    OB History     Gravida  6   Para  3   Term  2   Preterm  1   AB  2   Living  3      SAB  1   IAB  0   Ectopic  1   Multiple      Live Births  2            Home Medications    Prior to Admission medications   Medication Sig Start Date End Date Taking? Authorizing Provider  amoxicillin (AMOXIL) 875 MG tablet Take 1 tablet (875 mg total) by mouth 2 (two) times daily. 09/01/21  Yes Lattie Haw, MD  predniSONE (DELTASONE) 20 MG tablet Take one tab by mouth twice daily for 3 days, then one daily for 2 days. Take with food. 09/01/21  Yes Lattie Haw, MD  albuterol (VENTOLIN HFA) 108 (90 Base) MCG/ACT inhaler Inhale 2 puffs into the lungs every 4 (four) hours as needed for wheezing or shortness of breath. 03/05/20   Breeback, Jade L, PA-C  hydrochlorothiazide (HYDRODIURIL) 12.5 MG tablet TAKE 1 TABLET (12.5 MG TOTAL) BY MOUTH DAILY. APPT FOR FURTHER REFILLS 03/13/20   Tandy Gaw L, PA-C  ibuprofen (ADVIL)  600 MG tablet Take 1 tablet (600 mg total) by mouth every 6 (six) hours as needed. 01/22/21   Milas Hock, MD  Multiple Vitamin (MULTIVITAMIN) tablet Take 1 tablet by mouth daily.    [provider]    Family History Family History  Problem Relation Age of Onset   Cancer Maternal Grandmother        breast cancer   Diabetes Maternal Grandfather    Hypertension Mother    Hyperlipidemia Mother    Other Neg Hx    Colon cancer Neg Hx    Esophageal cancer Neg Hx     Social History Social History   Tobacco Use   Smoking status: Never   Smokeless tobacco: Never  Vaping Use   Vaping Use: Never used  Substance Use Topics   Alcohol use: No   Drug use: No     Allergies   Contrast media [iodinated contrast media]   Review of Systems Review of SystemsNo sore throat No cough No pleuritic pain No wheezing ?  nasal congestion No post-nasal drainage No sinus pain/pressure No itchy/red eyes + earache No hemoptysis No SOB No fever/chills No nausea No vomiting No abdominal pain No diarrhea No urinary symptoms No skin rash No fatigue No myalgias No headache    Physical Exam Triage Vital Signs ED Triage Vitals  Enc Vitals Group     BP 09/01/21 1131 (!) 132/92     Pulse Rate 09/01/21 1131 74     Resp 09/01/21 1131 14     Temp 09/01/21 1131 99 F (37.2 C)     Temp Source 09/01/21 1131 Oral     SpO2 09/01/21 1131 99 %     Weight --      Height --      Head Circumference --      Peak Flow --      Pain Score 09/01/21 1130 5     Pain Loc --      Pain Edu? --      Excl. in GC? --    No data found.  Updated Vital Signs BP (!) 132/92 (BP Location: Right Arm)   Pulse 74   Temp 99 F (37.2 C) (Oral)   Resp 14   LMP 12/09/2020 (Approximate)   SpO2 99%   Visual Acuity Right Eye Distance:   Left Eye Distance:   Bilateral Distance:    Right Eye Near:   Left Eye Near:    Bilateral Near:     Physical Exam Vitals and nursing note reviewed.  Constitutional:      General: She is not in acute distress. HENT:     Head: Normocephalic.     Right Ear: Tympanic membrane, ear canal and external ear normal. There is no impacted cerumen.     Left Ear: Tympanic membrane, ear canal and external ear normal. There is no impacted cerumen.     Nose: Nose normal.     Mouth/Throat:     Mouth: Mucous membranes are moist.     Pharynx: Oropharynx is clear.  Eyes:     Conjunctiva/sclera: Conjunctivae normal.     Pupils: Pupils are equal, round, and reactive to light.  Cardiovascular:     Rate and Rhythm: Normal rate.  Pulmonary:     Effort: Pulmonary effort is normal.  Musculoskeletal:     Cervical back: Neck supple.  Lymphadenopathy:     Cervical: No cervical adenopathy.  Skin:    General: Skin is warm and dry.  Neurological:     Mental Status: She is alert and oriented to person,  place, and time.      UC Treatments / Results  Labs (all labs ordered are listed, but only abnormal results are displayed) Labs Reviewed -   Tympanometry:  Right ear tympanogram wide; Left ear tympanogram positive peak pressure  EKG   Radiology No results found.  Procedures Procedures (including critical care time)  Medications Ordered in UC Medications - No data to display  Initial Impression / Assessment and Plan / UC Course  I have reviewed the triage vital signs and the nursing notes.  Pertinent labs & imaging results that were available during my care of the patient were reviewed by me and considered in my medical decision making (see chart for details).    Although tympanic membranes appear normal, note abnormal tympanogram, with evidence of early otitis media right ear. Begin amoxicillin and prednisone burst/taper. Followup with ENT if not improved one week.  Final Clinical Impressions(s) / UC Diagnoses   Final diagnoses:  Acute right otitis media     Discharge Instructions      May add Pseudoephedrine (30mg , one or two every 4 to 6 hours) for sinus congestion.  Get adequate rest.   Try using Flonase nasal spray daily.     ED Prescriptions     Medication Sig Dispense Auth. Provider   amoxicillin (AMOXIL) 875 MG tablet Take 1 tablet (875 mg total) by mouth 2 (two) times daily. 10 tablet , MD   predniSONE (DELTASONE) 20 MG tablet Take one tab by mouth twice daily for 3 days, then one daily for 2 days. Take with food. 8 tablet Lattie Haw, MD         Lattie Haw, MD 09/04/21 1019

## 2021-09-02 ENCOUNTER — Telehealth: Payer: Self-pay | Admitting: Emergency Medicine

## 2022-01-13 ENCOUNTER — Other Ambulatory Visit: Payer: Self-pay | Admitting: Physician Assistant

## 2022-01-13 DIAGNOSIS — Z3A01 Less than 8 weeks gestation of pregnancy: Secondary | ICD-10-CM

## 2022-01-20 ENCOUNTER — Telehealth: Payer: Self-pay | Admitting: General Practice

## 2022-01-20 NOTE — Telephone Encounter (Signed)
Transition Care Management Unsuccessful Follow-up Telephone Call  Date of discharge and from where:  01/19/22 from [facility]  Attempts:  1st Attempt  Reason for unsuccessful TCM follow-up call:  No answer/busy

## 2022-01-23 NOTE — Telephone Encounter (Signed)
Transition Care Management Unsuccessful Follow-up Telephone Call  Date of discharge and from where:  01/19/22 from [facility]  Attempts:  2nd Attempt  Reason for unsuccessful TCM follow-up call:  No answer/busy

## 2022-01-26 NOTE — Telephone Encounter (Signed)
Transition Care Management Unsuccessful Follow-up Telephone Call  Date of discharge and from where:  01/19/22 from [facility]  Attempts:  3rd Attempt  Reason for unsuccessful TCM follow-up call:  No answer/busy

## 2022-01-30 IMAGING — US US OB < 14 WEEKS - US OB TV
1 series · 13 of 28 positions shown · non-contrast
Comparison: Pelvic ultrasound dated October 27, 2017.

CLINICAL DATA: Pelvic pain. History of prior ectopic pregnancy.
Estimated gestational age of 6 weeks, 3 days by LMP.

EXAM:
OBSTETRIC <14 WK US AND TRANSVAGINAL OB US
TECHNIQUE: Both transabdominal and transvaginal ultrasound examinations were
performed for complete evaluation of the gestation as well as the
maternal uterus, adnexal regions, and pelvic cul-de-sac.
Transvaginal technique was performed to assess early pregnancy.

[Series 1: us ob < 14 weeks - us ob tv · 13 of 90 slices shown]
[im 4/90]
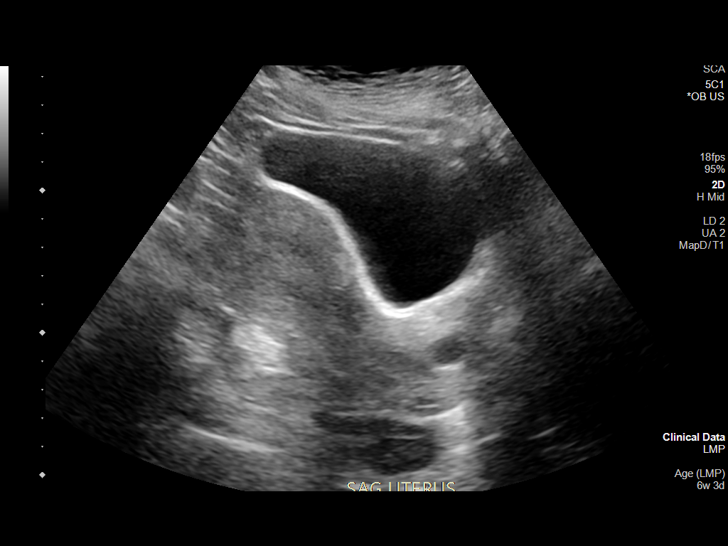
[im 10/90]
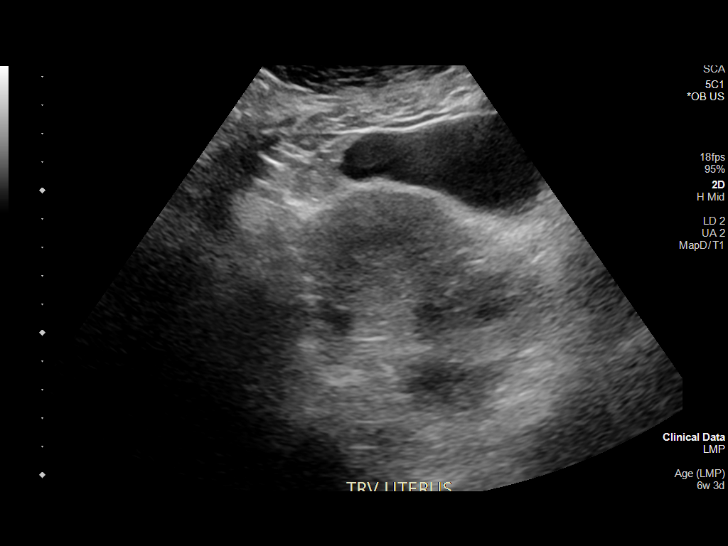
[im 17/90]
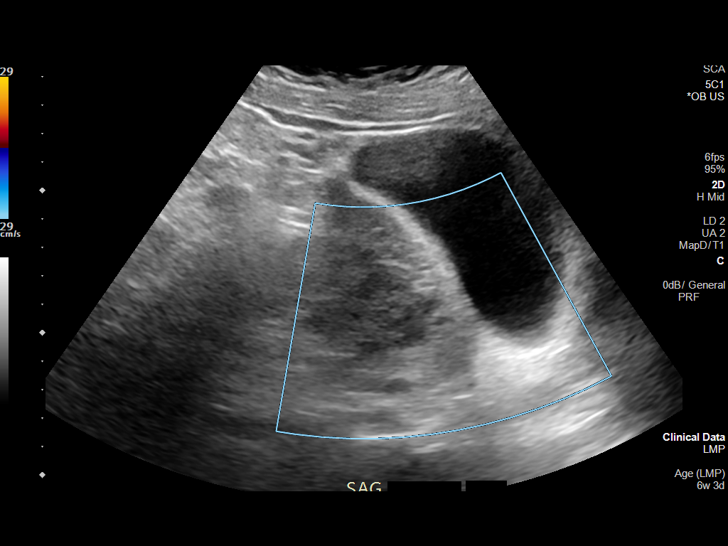
[im 24/90]
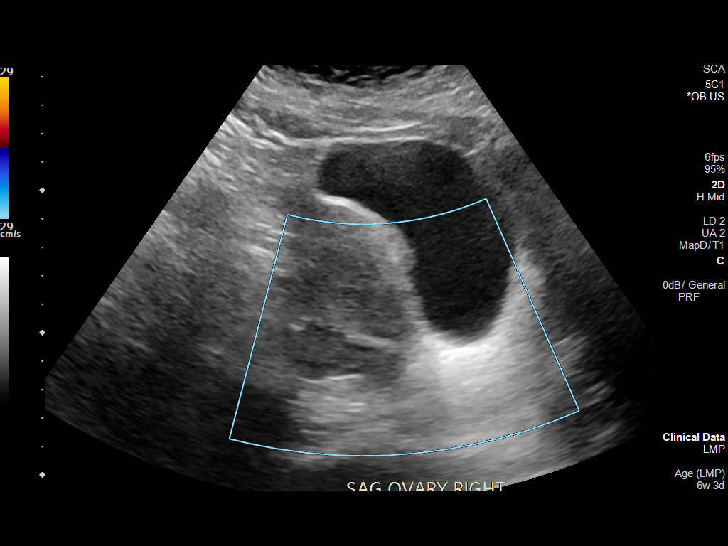
[im 30/90]
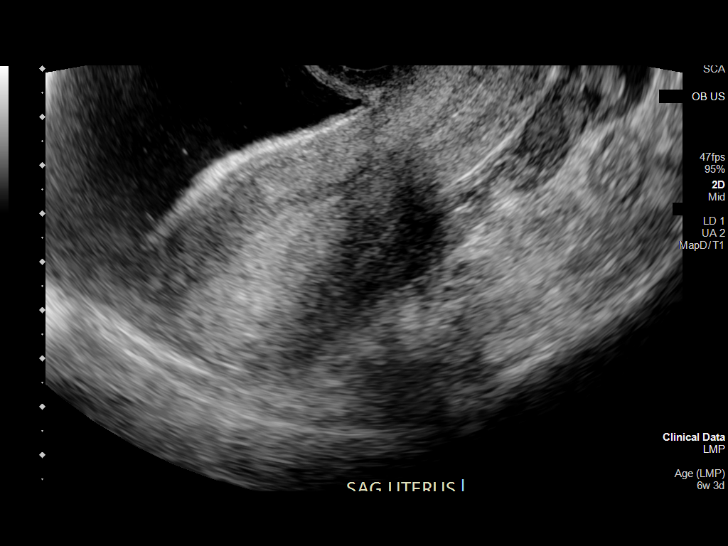
[im 37/90]
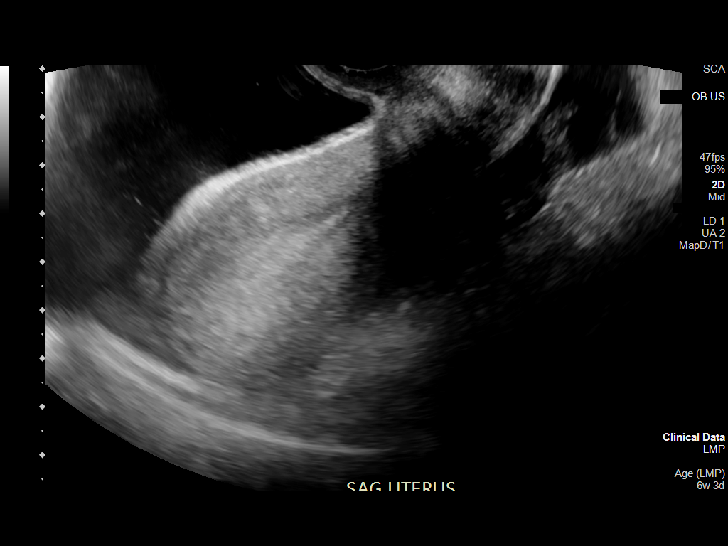
[im 47/90]
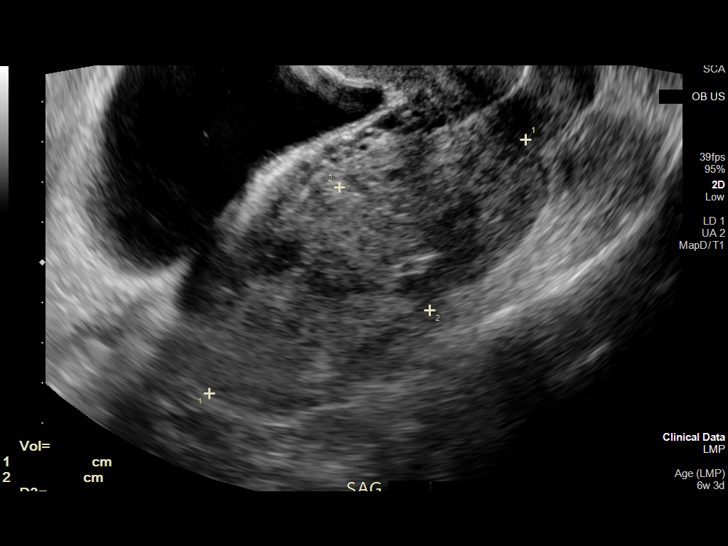
[im 53/90]
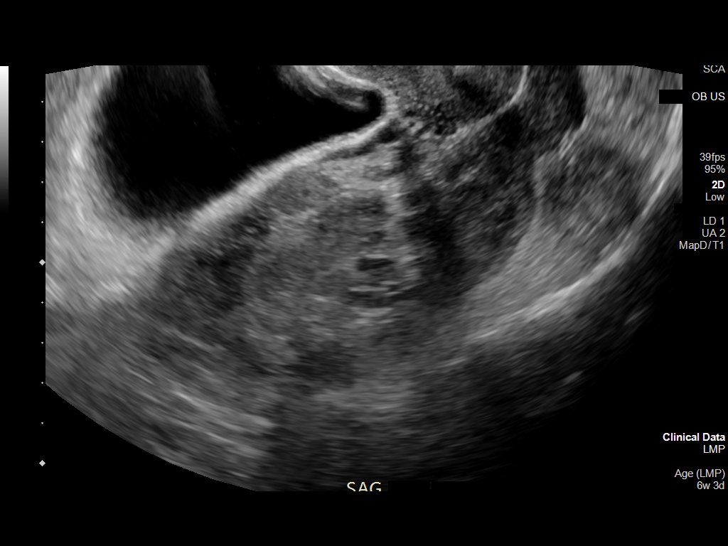
[im 60/90]
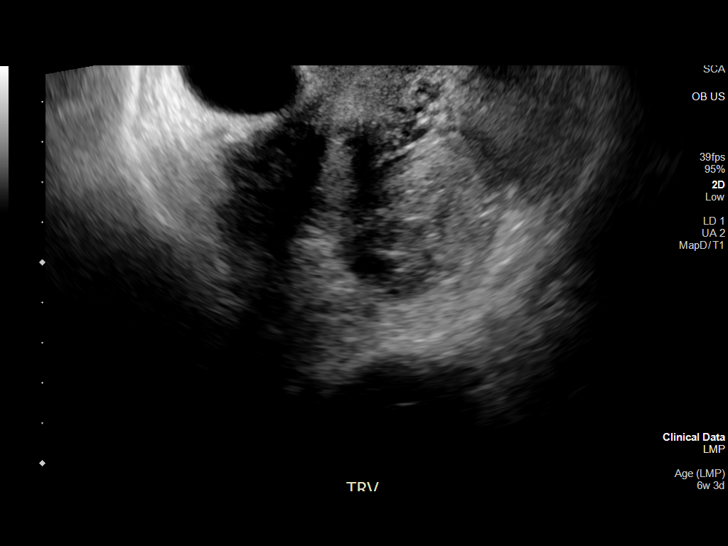
[im 66/90]
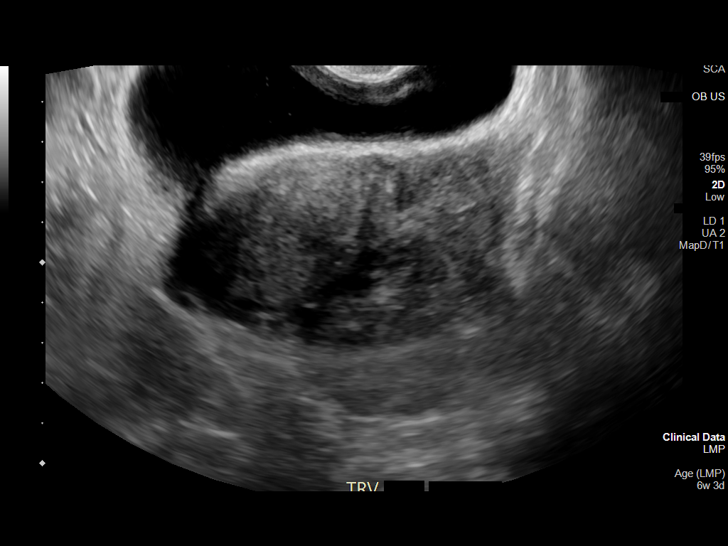
[im 73/90]
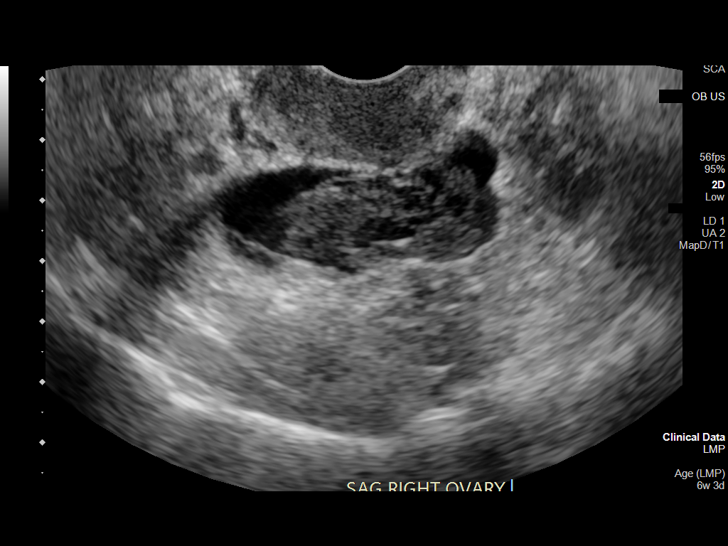
[im 80/90]
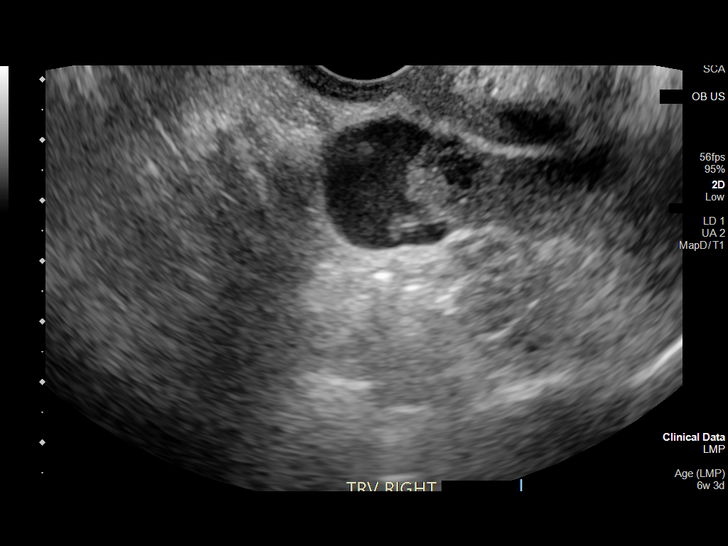
[im 86/90]
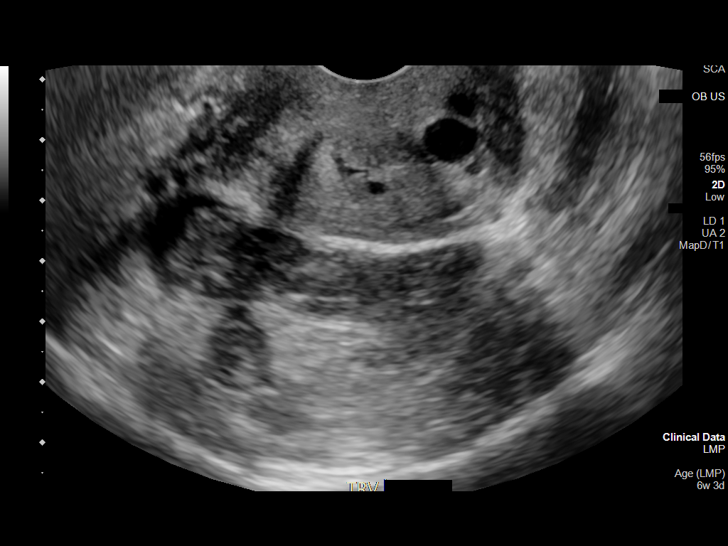

[13 of 28 positions shown; findings below may reference images not displayed]

FINDINGS: Intrauterine gestational sac: None.

Maternal uterus/adnexae: Complex left adnexal mass measuring 10.1 x
3.8 x 4.1 cm, difficult to separate from the left ovary. The right
ovary is unremarkable. There is a small amount of complex fluid
surrounding the right ovary.
IMPRESSION: 1. No intrauterine pregnancy. Complex left adnexal mass concerning
for ectopic pregnancy.
2. Small amount of complex fluid surrounding the right ovary, of
uncertain significance. Ruptured ectopic pregnancy is a possibility,
but there is no additional evident free fluid in the pelvis.

Critical Value/emergent results were called by telephone at the time
of interpretation on 01/22/2021 at [DATE] to provider NUZHAT ANTONIO
, who verbally acknowledged these results.

## 2022-02-03 ENCOUNTER — Telehealth (INDEPENDENT_AMBULATORY_CARE_PROVIDER_SITE_OTHER): Payer: Medicaid Other | Admitting: Family Medicine

## 2022-02-03 ENCOUNTER — Encounter: Payer: Self-pay | Admitting: Family Medicine

## 2022-02-03 ENCOUNTER — Telehealth: Payer: Self-pay | Admitting: Family Medicine

## 2022-02-03 VITALS — Ht 62.0 in | Wt 152.0 lb

## 2022-02-03 DIAGNOSIS — J22 Unspecified acute lower respiratory infection: Secondary | ICD-10-CM | POA: Diagnosis not present

## 2022-02-03 MED ORDER — HYDROCODONE BIT-HOMATROP MBR 5-1.5 MG/5ML PO SOLN: 5.0000 mL | Freq: Four times a day (QID) | ORAL | 0 refills | Status: DC | PRN

## 2022-02-03 MED ORDER — AZITHROMYCIN 250 MG PO TABS: ORAL_TABLET | ORAL | 0 refills | Status: AC

## 2022-02-03 MED ORDER — ONDANSETRON 4 MG PO TBDP: 4.0000 mg | ORAL_TABLET | Freq: Three times a day (TID) | ORAL | 0 refills | Status: AC | PRN

## 2022-02-03 NOTE — Telephone Encounter (Signed)
Call and spoke with patient. Patient advised  

## 2022-02-03 NOTE — Assessment & Plan Note (Signed)
She has had prolonged symptoms without significant improvement.  Adding course of azithromycin as well as Hycodan cough syrup as needed to help with cough.  Additionally I will send some Zofran however she is having some nausea with cough.  Encouraged continued supportive care with increase fluids, humidifier and relative rest.  We discussed that if she is not improving over the next 7 to 10 days I think she needs an in office evaluation

## 2022-02-03 NOTE — Telephone Encounter (Signed)
Completed.

## 2022-02-03 NOTE — Progress Notes (Signed)
Michelle Macias - 32 y.o. female MRN [id number]  Date of birth: Oct 23, 1990   This visit type was conducted due to national recommendations for restrictions regarding the COVID-19 Pandemic (e.g. social distancing).  This format is felt to be most appropriate for this patient at this time.  All issues noted in this document were discussed and addressed.  No physical exam was performed (except for noted visual exam findings with Video Visits).  I discussed the limitations of evaluation and management by telemedicine and the availability of in person appointments. The patient expressed understanding and agreed to proceed.  I connected withNAME@ on 02/03/22 at 11:30 AM EST by a video enabled telemedicine application and verified that I am speaking with the correct person using two identifiers.  Present at visit: Michelle Nutting, DO Michelle Macias   Patient Location: [place] [address]   Provider location:   [facility]  Chief Complaint  Patient presents with   Cough    HPI  Michelle Macias is a 32 y.o. female who presents via audio/video conferencing for a telehealth visit today.  She has complaint of cough and chest congestion.  She has had this for about 2 weeks without significant improvement.  She denies wheezing or shortness of breath.  Cough is keeping her awake at night.  She is coughing to the point where she is having nausea.  Denies fever or chills.  She has tried over-the-counter medications without much relief.  ROS:  A comprehensive ROS was completed and negative except as noted per HPI  Past Medical History:  Diagnosis Date   Anemia    Ectopic pregnancy    H/O hernia repair    2 hernia repairs 1 umbilical & 1 groin   Hx of thrombocytopenia    in pregnancy   Hypertension    managed with lifestyle modification   Preterm labor    Tension headache     Past Surgical History:  Procedure Laterality Date   ABLATION ON ENDOMETRIOSIS      DIAGNOSTIC LAPAROSCOPY WITH REMOVAL OF ECTOPIC PREGNANCY N/A 01/22/2021   Procedure: DIAGNOSTIC LAPAROSCOPY WITH REMOVAL OF ECTOPIC PREGNANCY;  Surgeon: Radene Gunning, MD;  Location: [facility];  Service: Gynecology;  Laterality: N/A;   HERNIA REPAIR     Inguinal 31 yo,, groin   HERNIA REPAIR  8416   umbilical    LAPAROSCOPIC TUBAL LIGATION  12/08/2011   Procedure: LAPAROSCOPIC TUBAL LIGATION;  Surgeon: Mora Bellman, MD;  Location: [facility];  Service: Gynecology;  Laterality: Bilateral;   tubal reanastamosis     UMBILICAL HERNIA REPAIR  2016   also had previous umbilical hernia repair    Family History  Problem Relation Age of Onset   Cancer Maternal Grandmother        breast cancer   Diabetes Maternal Grandfather    Hypertension Mother    Hyperlipidemia Mother    Other Neg Hx    Colon cancer Neg Hx    Esophageal cancer Neg Hx     Social History   Socioeconomic History   Marital status: Legally Separated    Spouse name: Not on file   Number of children: 3   Years of education: Not on file   Highest education level: Not on file  Occupational History   Occupation: Accountant  Tobacco Use   Smoking status: Never   Smokeless tobacco: Never  Vaping Use   Vaping Use: Never used  Substance and Sexual Activity   Alcohol use:  No   Drug use: No   Sexual activity: Not Currently    Birth control/protection: Surgical  Other Topics Concern   Not on file  Social History Narrative   Not on file   Social Determinants of Health   Financial Resource Strain: Not on file  Food Insecurity: Not on file  Transportation Needs: Not on file  Physical Activity: Not on file  Stress: Not on file  Social Connections: Not on file  Intimate Partner Violence: Not on file     Current Outpatient Medications:    azithromycin (ZITHROMAX) 250 MG tablet, Take 2 tablets on day 1, then 1 tablet daily on days 2 through 5, Disp: 6 tablet, Rfl: 0   ondansetron (ZOFRAN-ODT) 4 MG disintegrating tablet,  Take 1 tablet (4 mg total) by mouth every 8 (eight) hours as needed for nausea or vomiting., Disp: 20 tablet, Rfl: 0   albuterol (VENTOLIN HFA) 108 (90 Base) MCG/ACT inhaler, Inhale 2 puffs into the lungs every 4 (four) hours as needed for wheezing or shortness of breath., Disp: 8 g, Rfl: 2   amoxicillin (AMOXIL) 875 MG tablet, Take 1 tablet (875 mg total) by mouth 2 (two) times daily., Disp: 10 tablet, Rfl: 0   hydrochlorothiazide (HYDRODIURIL) 12.5 MG tablet, TAKE 1 TABLET (12.5 MG TOTAL) BY MOUTH DAILY. APPT FOR FURTHER REFILLS, Disp: 7 tablet, Rfl: 0   HYDROcodone bit-homatropine (HYDROMET) 5-1.5 MG/5ML syrup, Take 5 mLs by mouth every 6 (six) hours as needed for cough., Disp: 120 mL, Rfl: 0   ibuprofen (ADVIL) 600 MG tablet, Take 1 tablet (600 mg total) by mouth every 6 (six) hours as needed., Disp: 60 tablet, Rfl: 3   Multiple Vitamin (MULTIVITAMIN) tablet, Take 1 tablet by mouth daily., Disp: , Rfl:    predniSONE (DELTASONE) 20 MG tablet, Take one tab by mouth twice daily for 3 days, then one daily for 2 days. Take with food., Disp: 8 tablet, Rfl: 0  EXAM:  VITALS per patient if applicable: Ht 5\' 2"  (1.575 m)   Wt 152 lb (68.9 kg)   BMI 27.80 kg/m   GENERAL: alert, oriented, appears well and in no acute distress  HEENT: atraumatic, conjunttiva clear, no obvious abnormalities on inspection of external nose and ears  NECK: normal movements of the head and neck  LUNGS: on inspection no signs of respiratory distress, breathing rate appears normal, no obvious gross SOB, gasping or wheezing  CV: no obvious cyanosis  MS: moves all visible extremities without noticeable abnormality  PSYCH/NEURO: pleasant and cooperative, no obvious depression or anxiety, speech and thought processing grossly intact  ASSESSMENT AND PLAN:  Discussed the following assessment and plan:  Lower respiratory infection She has had prolonged symptoms without significant improvement.  Adding course of  azithromycin as well as Hycodan cough syrup as needed to help with cough.  Additionally I will send some Zofran however she is having some nausea with cough.  Encouraged continued supportive care with increase fluids, humidifier and relative rest.  We discussed that if she is not improving over the next 7 to 10 days I think she needs an in office evaluation     I discussed the assessment and treatment plan with the patient. The patient was provided an opportunity to ask questions and all were answered. The patient agreed with the plan and demonstrated an understanding of the instructions.   The patient was advised to call back or seek an in-person evaluation if the symptoms worsen or if the condition fails  to improve as anticipated.    Michelle Nutting, DO

## 2022-02-03 NOTE — Telephone Encounter (Signed)
Patient called her medication HYDROcodone bit-homatropine (HYDROMET) 5-1.5 MG/5ML syrup  she needs it recent to [facility] in [place] on [address]

## 2022-02-15 ENCOUNTER — Telehealth: Payer: Medicaid Other | Admitting: Urgent Care

## 2022-02-15 DIAGNOSIS — K209 Esophagitis, unspecified without bleeding: Secondary | ICD-10-CM

## 2022-02-15 DIAGNOSIS — M1990 Unspecified osteoarthritis, unspecified site: Secondary | ICD-10-CM | POA: Diagnosis not present

## 2022-02-15 MED ORDER — CELECOXIB 200 MG PO CAPS: 200.0000 mg | ORAL_CAPSULE | Freq: Two times a day (BID) | ORAL | 0 refills | Status: AC

## 2022-02-15 MED ORDER — PANTOPRAZOLE SODIUM 40 MG PO TBEC: 40.0000 mg | DELAYED_RELEASE_TABLET | Freq: Every day | ORAL | 0 refills | Status: DC

## 2022-02-15 NOTE — Progress Notes (Signed)
Virtual Visit Consent   Michelle Macias, you are scheduled for a virtual visit with a [facility] provider today. Just as with appointments in the office, your consent must be obtained to participate. Your consent will be active for this visit and any virtual visit you may have with one of our providers in the next 365 days. If you have a MyChart account, a copy of this consent can be sent to you electronically.  As this is a virtual visit, video technology does not allow for your provider to perform a traditional examination. This may limit your provider's ability to fully assess your condition. If your provider identifies any concerns that need to be evaluated in person or the need to arrange testing (such as labs, EKG, etc.), we will make arrangements to do so. Although advances in technology are sophisticated, we cannot ensure that it will always work on either your end or our end. If the connection with a video visit is poor, the visit may have to be switched to a telephone visit. With either a video or telephone visit, we are not always able to ensure that we have a secure connection.  By engaging in this virtual visit, you consent to the provision of healthcare and authorize for your insurance to be billed (if applicable) for the services provided during this visit. Depending on your insurance coverage, you may receive a charge related to this service.  I need to obtain your verbal consent now. Are you willing to proceed with your visit today? Michelle Macias has provided verbal consent on 02/15/2022 for a virtual visit (video or telephone). Michelle Malling, PA  Date: 02/15/2022 2:13 PM  Virtual Visit via Video Note   I, [place], connected with  Michelle Macias  ([id number], 1990/08/01) on 02/15/22 at  2:00 PM EST by a video-enabled telemedicine application and verified that I am speaking with the correct person using two identifiers.  Location: Patient: Virtual Visit Location  Patient: Home Provider: Virtual Visit Location Provider: Home Office   I discussed the limitations of evaluation and management by telemedicine and the availability of in person appointments. The patient expressed understanding and agreed to proceed.    History of Present Illness: Michelle Macias is a 32 y.o. who identifies as a female who was assigned female at birth, and is being seen today for heartburn.  HPI: 32yo female presents today due to concern of heartburn. She does have a hx of heartburn and a gastric ulcer, dx by EGD in 2020. States she took PPIs for 6 mo and sx resolved. Over the past 24 hours, pt states she is having similar heartburn sx, although it is located in her esophagus this time rather than her stomach. She took Pepcid AC and Tums without resolution. Sx are substernal. No radiation to back. No sx in abdomen. She denies reproducible chest tenderness. She denies CP, palps or SOB. No jaw pain. Admits to taking ibuprofen twice daily for several months due to arthritis and headaches. Over the past 24-48 hours, vomited once and feels like food is passing slower than normal. Some dysphagia and odynophagia. No nausea. No fever. No recent covid exposures or URI sx. Has been monitoring BP at home with reading today 133/98. Does not smoke.   Problems:  Patient Active Problem List   Diagnosis Date Noted   Lower respiratory infection 02/03/2022   Reactive airways dysfunction syndrome ([place]) 01/22/2021   History of ectopic pregnancy 01/17/2021   COVID-19  02/25/2020   Flushing 08/15/2019   Gastric ulcer 02/27/2019   Migraine without aura and without status migrainosus, not intractable 02/22/2019   Stress at home 10/04/2018   Panic attacks 10/04/2018   Anxiety and depression 12/09/2016   Outbursts of anger 12/09/2016   Stress reaction 12/09/2016   Vitamin D deficiency 05/30/2014   Constipation 04/30/2014   Medullary calcification of kidney 40/10/2723   Hx of umbilical hernia  repair 04/24/2014    Allergies:  Allergies  Allergen Reactions   Contrast Media [Iodinated Contrast Media] Anaphylaxis    Started today 05/06/15   Medications:  Current Outpatient Medications:    celecoxib (CELEBREX) 200 MG capsule, Take 1 capsule (200 mg total) by mouth 2 (two) times daily with a meal., Disp: 60 capsule, Rfl: 0   pantoprazole (PROTONIX) 40 MG tablet, Take 1 tablet (40 mg total) by mouth daily., Disp: 30 tablet, Rfl: 0   albuterol (VENTOLIN HFA) 108 (90 Base) MCG/ACT inhaler, Inhale 2 puffs into the lungs every 4 (four) hours as needed for wheezing or shortness of breath., Disp: 8 g, Rfl: 2   Multiple Vitamin (MULTIVITAMIN) tablet, Take 1 tablet by mouth daily., Disp: , Rfl:    ondansetron (ZOFRAN-ODT) 4 MG disintegrating tablet, Take 1 tablet (4 mg total) by mouth every 8 (eight) hours as needed for nausea or vomiting., Disp: 20 tablet, Rfl: 0  Observations/Objective: Patient is well-developed, well-nourished in no acute distress.  Resting comfortably at home. Non-toxic, does not appear acutely ill. No diaphoresis.  Head is normocephalic, atraumatic.  No labored breathing. No cough Speech is clear and coherent with logical content.  Patient is alert and oriented at baseline.  No reproducible sternal pain No rash. No rhinorrhea or scleral icterus No drooling.  Assessment and Plan: 1. Arthritis  2. Esophagitis  Pt instructed to STOP all OTC NSAIDs immediately. Will change to Cox-2 inhibitor to decrease the chance of GI side effects. Suspect esophagitis at present time. Will start pantoprazole 40mg . Take 30 min before meals once daily. May increase to max 80mg  daily if needed. Trial of warm compress as well for possibility of costochondritis, although less likely. Eat bland foods.  Follow Up Instructions: I discussed the assessment and treatment plan with the patient. The patient was provided an opportunity to ask questions and all were answered. The patient agreed  with the plan and demonstrated an understanding of the instructions.  A copy of instructions were sent to the patient via MyChart unless otherwise noted below.    The patient was advised to call back or seek an in-person evaluation if the symptoms worsen or if the condition fails to improve as anticipated.  Time:  I spent 8 minutes with the patient via telehealth technology discussing the above problems/concerns.    [place], PA

## 2022-02-15 NOTE — Patient Instructions (Signed)
Michelle Macias, thank you for joining [name], PA for today's virtual visit.  While this provider is not your primary care provider (PCP), if your PCP is located in our provider database this encounter information will be shared with them immediately following your visit.   [facility] account gives you access to today's visit and all your visits, tests, and labs performed at [facility] " click here if you don't have a [facility] account or go to [email or web address]  Consent: (Patient) Michelle Macias provided verbal consent for this virtual visit at the beginning of the encounter.  Current Medications:  Current Outpatient Medications:    celecoxib (CELEBREX) 200 MG capsule, Take 1 capsule (200 mg total) by mouth 2 (two) times daily with a meal., Disp: 60 capsule, Rfl: 0   pantoprazole (PROTONIX) 40 MG tablet, Take 1 tablet (40 mg total) by mouth daily., Disp: 30 tablet, Rfl: 0   albuterol (VENTOLIN HFA) 108 (90 Base) MCG/ACT inhaler, Inhale 2 puffs into the lungs every 4 (four) hours as needed for wheezing or shortness of breath., Disp: 8 g, Rfl: 2   Multiple Vitamin (MULTIVITAMIN) tablet, Take 1 tablet by mouth daily., Disp: , Rfl:    ondansetron (ZOFRAN-ODT) 4 MG disintegrating tablet, Take 1 tablet (4 mg total) by mouth every 8 (eight) hours as needed for nausea or vomiting., Disp: 20 tablet, Rfl: 0   Medications ordered in this encounter:  Meds ordered this encounter  Medications   pantoprazole (PROTONIX) 40 MG tablet    Sig: Take 1 tablet (40 mg total) by mouth daily.    Dispense:  30 tablet    Refill:  0    Order Specific Question:   Supervising Provider    Answer:   Chase Picket [[id number]]   celecoxib (CELEBREX) 200 MG capsule    Sig: Take 1 capsule (200 mg total) by mouth 2 (two) times daily with a meal.    Dispense:  60 capsule    Refill:  0    Order Specific Question:   Supervising Provider    Answer:   Chase Picket [[id number]]     *If you need refills on other medications prior to your next appointment, please contact your pharmacy*  Follow-Up: Call back or seek an in-person evaluation if the symptoms worsen or if the condition fails to improve as anticipated.  [place] [phone]  Other Instructions Please STOP all OTC NSAIDS - stop taking ibuprofen, motrin, aleve, naprozen, or advil. No aspirin containing products either. Switch to celebrex 200mg . Take once daily with food to help with headaches and arthritis pain. May increase to twice daily if once daily ineffective.  For your esophagitis, please start taking protonix 40mg  once daily, 30 min before meals. If needed, may increase to twice daily for a short period of time. Take small bites, chew completely, and alternate with drinking thin liquids like water after each bite. Eat bland foods that are not too greasy or spicy for the next week.  Additionally, please use a warm compress or a microwaveable heat pack to your chest. Do this twice daily.   If your symptoms persist or worsen, please head to an in person Urgent Care evaluation. If you develop severe chest pain or shortness of breath, head to the nearest Emergency Room.     If you have been instructed to have an in-person evaluation today at a local Urgent Care facility, please use the link  below. It will take you to a list of all of our available [facility], including address, phone number and hours of operation. Please do not delay care.  [facility]  If you or a family member do not have a primary care provider, use the link below to schedule a visit and establish care. When you choose a [facility] primary care physician or advanced practice provider, you gain a long-term partner in health. Find a Primary Care Provider  Learn more about [facility]'s in-office and virtual care options: [facility]

## 2022-05-28 ENCOUNTER — Encounter: Payer: Self-pay | Admitting: Family Medicine

## 2022-05-28 ENCOUNTER — Ambulatory Visit (INDEPENDENT_AMBULATORY_CARE_PROVIDER_SITE_OTHER): Payer: Medicaid Other | Admitting: Family Medicine

## 2022-05-28 VITALS — BP 135/90 | HR 67 | Resp 20 | Ht 62.0 in | Wt 145.0 lb

## 2022-05-28 DIAGNOSIS — R42 Dizziness and giddiness: Secondary | ICD-10-CM | POA: Diagnosis not present

## 2022-05-28 MED ORDER — MECLIZINE HCL 25 MG PO TABS: 25.0000 mg | ORAL_TABLET | Freq: Three times a day (TID) | ORAL | 0 refills | Status: DC | PRN

## 2022-05-28 NOTE — Progress Notes (Signed)
Acute Office Visit  Subjective:     Patient ID: Michelle Macias, female    DOB: 1990/03/16, 32 y.o.   MRN: [id number]  Chief Complaint  Patient presents with   Fatigue   Dizziness    HPI Patient is in today for dizziness and lightheadedness. She notes dizziness starts when she looks at the computer and then when she is turns her head too quickly. She will also get a hot/cold feeling and feeling fainting. It has been going on for a week and a half. She admits to drinking 5-6 water bottles per day. Denies any recent illnesses.  Review of Systems  Constitutional:  Negative for chills and fever.  Respiratory:  Negative for cough and shortness of breath.   Cardiovascular:  Negative for chest pain.  Neurological:  Negative for headaches.        Objective:    BP (!) 135/90 (BP Location: Right Arm, Cuff Size: Normal)   Pulse 67   Resp 20   Ht 5\' 2"  (1.575 m)   Wt 145 lb 0.6 oz (65.8 kg)   SpO2 99%   BMI 26.53 kg/m    Physical Exam Vitals and nursing note reviewed.  Constitutional:      General: She is not in acute distress.    Appearance: Normal appearance.  HENT:     Head: Normocephalic and atraumatic.     Right Ear: External ear normal.     Left Ear: External ear normal.     Nose: Nose normal.  Eyes:     Conjunctiva/sclera: Conjunctivae normal.  Cardiovascular:     Rate and Rhythm: Normal rate and regular rhythm.  Pulmonary:     Effort: Pulmonary effort is normal.     Breath sounds: Normal breath sounds.  Neurological:     General: No focal deficit present.     Mental Status: She is alert and oriented to person, place, and time.     Comments: Dix hallpike test negative  Psychiatric:        Mood and Affect: Mood normal.        Behavior: Behavior normal.        Thought Content: Thought content normal.        Judgment: Judgment normal.     Results for orders placed or performed in visit on 05/28/22  COMPLETE METABOLIC PANEL WITH GFR  Result Value Ref  Range   Glucose, Bld 91 65 - 99 mg/dL   BUN 8 7 - 25 mg/dL   Creat 7.82 9.56 - 2.13 mg/dL   eGFR 086 > OR = 60 VH/QIO/9.62X5   BUN/Creatinine Ratio SEE NOTE: 6 - 22 (calc)   Sodium 141 135 - 146 mmol/L   Potassium 4.5 3.5 - 5.3 mmol/L   Chloride 106 98 - 110 mmol/L   CO2 25 20 - 32 mmol/L   Calcium 9.5 8.6 - 10.2 mg/dL   Total Protein 6.9 6.1 - 8.1 g/dL   Albumin 4.2 3.6 - 5.1 g/dL   Globulin 2.7 1.9 - 3.7 g/dL (calc)   AG Ratio 1.6 1.0 - 2.5 (calc)   Total Bilirubin 0.3 0.2 - 1.2 mg/dL   Alkaline phosphatase (APISO) 89 31 - 125 U/L   AST 17 10 - 30 U/L   ALT 11 6 - 29 U/L        Assessment & Plan:   Problem List Items Addressed This Visit       Other   Dizziness - Primary    -  dix hallpike test negative for nystagmus. Pt did have some dizziness upon returning from laying to sitting.  - orthostatics negative and pt has good fluid intake with no signs of dehydration on exam - ear exam was grossly normal.  - have ordered a cmp to look for metabolic reason for dizziness - have given patient a trial of meclizine to see if this can control her dizzy spells - have sent referral to ent as I do believe this could be an inner ear issue - follow up in one week with PCP.      Relevant Orders   COMPLETE METABOLIC PANEL WITH GFR (Completed)   Ambulatory referral to ENT    Meds ordered this encounter  Medications   meclizine (ANTIVERT) 25 MG tablet    Sig: Take 1 tablet (25 mg total) by mouth 3 (three) times daily as needed for dizziness.    Dispense:  30 tablet    Refill:  0    Return in about 1 week (around 06/04/2022).  Charlton Amor, DO

## 2022-05-29 DIAGNOSIS — R42 Dizziness and giddiness: Secondary | ICD-10-CM | POA: Insufficient documentation

## 2022-05-29 LAB — COMPLETE METABOLIC PANEL WITH GFR
AG Ratio: 1.6 (calc) (ref 1.0–2.5)
ALT: 11 U/L (ref 6–29)
AST: 17 U/L (ref 10–30)
Albumin: 4.2 g/dL (ref 3.6–5.1)
Alkaline phosphatase (APISO): 89 U/L (ref 31–125)
BUN: 8 mg/dL (ref 7–25)
CO2: 25 mmol/L (ref 20–32)
Calcium: 9.5 mg/dL (ref 8.6–10.2)
Chloride: 106 mmol/L (ref 98–110)
Creat: 0.74 mg/dL (ref 0.50–0.97)
Globulin: 2.7 g/dL (calc) (ref 1.9–3.7)
Glucose, Bld: 91 mg/dL (ref 65–99)
Potassium: 4.5 mmol/L (ref 3.5–5.3)
Sodium: 141 mmol/L (ref 135–146)
Total Bilirubin: 0.3 mg/dL (ref 0.2–1.2)
Total Protein: 6.9 g/dL (ref 6.1–8.1)
eGFR: 109 mL/min/{1.73_m2} (ref >=60)

## 2022-05-29 NOTE — Assessment & Plan Note (Signed)
-   dix hallpike test negative for nystagmus. Pt did have some dizziness upon returning from laying to sitting.  - orthostatics negative and pt has good fluid intake with no signs of dehydration on exam - ear exam was grossly normal.  - have ordered a cmp to look for metabolic reason for dizziness - have given patient a trial of meclizine to see if this can control her dizzy spells - have sent referral to ent as I do believe this could be an inner ear issue - follow up in one week with PCP.

## 2022-07-01 ENCOUNTER — Telehealth: Payer: Self-pay | Admitting: *Deleted

## 2022-07-01 NOTE — Telephone Encounter (Signed)
Left patient a message to call to schedule annual for the year.

## 2022-11-24 ENCOUNTER — Telehealth: Payer: Self-pay

## 2022-11-24 NOTE — Telephone Encounter (Signed)
Michelle Macias complains of chest pain, shortness of breath and left arm pain while working out. I advised her to have someone take her to the ED.

## 2022-11-24 NOTE — Telephone Encounter (Signed)
Agree with documentation as above.   Michelle Coonan, MD  

## 2022-11-25 ENCOUNTER — Telehealth: Payer: Self-pay | Admitting: General Practice

## 2022-11-25 NOTE — Transitions of Care (Post Inpatient/ED Visit) (Signed)
   11/25/2022  Name: Michelle Macias MRN: [id number] DOB: 1990-09-16  Today's TOC FU Call Status: Today's TOC FU Call Status:: Successful TOC FU Call Completed TOC FU Call Complete Date: 11/25/22 Patient's Name and Date of Birth confirmed.  Transition Care Management Follow-up Telephone Call Date of Discharge: 11/24/22 Discharge Facility: Other [facility]) Name of Other (Non[facility]) Discharge Facility: [facility] Type of Discharge: Emergency Department Reason for ED Visit: Cardiac Conditions Cardiac Conditions Diagnosis: Chest Pain Persisting How have you been since you were released from the hospital?: Better Any questions or concerns?: No  Items Reviewed: Did you receive and understand the discharge instructions provided?: Yes Medications obtained,verified, and reconciled?: Yes (Medications Reviewed) Any new allergies since your discharge?: No Dietary orders reviewed?: NA Do you have support at home?: Yes  Medications Reviewed Today: Medications Reviewed Today     Reviewed by Modesto Charon, RN (Registered Nurse) on 11/25/22 at [phone]  Med List Status: <None>   Medication Order Taking? Sig Documenting Provider Last Dose Status Informant  albuterol (VENTOLIN HFA) 108 (90 Base) MCG/ACT inhaler [id number] No Inhale 2 puffs into the lungs every 4 (four) hours as needed for wheezing or shortness of breath. Jomarie Longs, PA-C Taking Active   meclizine (ANTIVERT) 25 MG tablet [id number]  Take 1 tablet (25 mg total) by mouth 3 (three) times daily as needed for dizziness. Charlton Amor, DO  Active   Multiple Vitamin (MULTIVITAMIN) tablet [id number] No Take 1 tablet by mouth daily. [provider] Taking Active   ondansetron (ZOFRAN-ODT) 4 MG disintegrating tablet [id number] No Take 1 tablet (4 mg total) by mouth every 8 (eight) hours as needed for nausea or vomiting. Everrett Coombe, DO Taking Active   pantoprazole (PROTONIX) 40 MG tablet [id number]  Take 1 tablet (40 mg total)  by mouth daily. Maretta Bees, [place]  Expired 03/17/22 2359             Home Care and Equipment/Supplies: Were Home Health Services Ordered?: NA Any new equipment or medical supplies ordered?: NA  Functional Questionnaire: Do you need assistance with bathing/showering or dressing?: No Do you need assistance with meal preparation?: No Do you need assistance with eating?: No Do you have difficulty maintaining continence: No Do you need assistance with getting out of bed/getting out of a chair/moving?: No Do you have difficulty managing or taking your medications?: No  Follow up appointments reviewed: PCP Follow-up appointment confirmed?: NA Specialist Hospital Follow-up appointment confirmed?: Yes Follow-Up Specialty Provider:: Patient is waiting to hear back from cardiologist office. Do you need transportation to your follow-up appointment?: No Do you understand care options if your condition(s) worsen?: Yes-patient verbalized understanding  SDOH Interventions Today    Flowsheet Row Most Recent Value  SDOH Interventions   Transportation Interventions Intervention Not Indicated       SIGNATURE Modesto Charon, RN BSN Nurse Health Advisor

## 2023-01-27 ENCOUNTER — Other Ambulatory Visit (HOSPITAL_COMMUNITY)
Admission: RE | Admit: 2023-01-27 | Discharge: 2023-01-27 | Disposition: A | Discharge status: Home / Self Care | Payer: Medicaid Other | Source: Ambulatory Visit | Attending: Obstetrics and Gynecology | Admitting: Obstetrics and Gynecology

## 2023-01-27 ENCOUNTER — Encounter: Payer: Self-pay | Admitting: Obstetrics and Gynecology

## 2023-01-27 ENCOUNTER — Ambulatory Visit: Payer: Medicaid Other | Admitting: Obstetrics and Gynecology

## 2023-01-27 VITALS — BP 117/84 | HR 82 | Ht 62.0 in | Wt 145.0 lb

## 2023-01-27 DIAGNOSIS — Z113 Encounter for screening for infections with a predominantly sexual mode of transmission: Secondary | ICD-10-CM

## 2023-01-27 DIAGNOSIS — N898 Other specified noninflammatory disorders of vagina: Secondary | ICD-10-CM | POA: Insufficient documentation

## 2023-01-27 MED ORDER — FLUCONAZOLE 150 MG PO TABS: 150.0000 mg | ORAL_TABLET | Freq: Once | ORAL | 0 refills | Status: AC

## 2023-01-27 MED ORDER — CLOTRIMAZOLE 1 % VA CREA: 1.0000 | TOPICAL_CREAM | Freq: Every day | VAGINAL | 0 refills | Status: DC

## 2023-01-27 NOTE — Patient Instructions (Signed)
 You will see your results in the MyChart app by the end of the weekend!

## 2023-01-27 NOTE — Progress Notes (Signed)
   RETURN GYNECOLOGY VISIT  Subjective:  Michelle Macias is a 33 y.o. [id number] with LMP 01/21/23 presenting for vaginal itching/irritation.  1 week itching and irritation. Used PO azo dual action and initially cottage cheese looking discharge went away. Had intercourse with her partner and it was painful, irritating and her itching/symptoms came back. He is also having symptoms.   Pap 07/17/21 NILM/NPG neg  Objective:   Vitals:   01/27/23 0957  BP: 117/84  Pulse: 82  Weight: 145 lb (65.8 kg)  Height: 5' 2 (1.575 m)    General:  Alert, oriented and cooperative. Patient is in no acute distress.  Skin: Skin is warm and dry. No rash noted.   Cardiovascular: Normal heart rate noted  Respiratory: Normal respiratory effort, no problems with respiration noted  Abdomen: Soft, non-tender, non-distended   Pelvic: NEFG. Small amount of thick white vaginal discharge. Vagina epithelium erythematous and tender.  Exam performed in the presence of a chaperone  Assessment and Plan:  [name] is a 33 y.o. with suspected vulvovaginal candidiasis  Vaginal itching Screening examination for STI -     Cervicovaginal ancillary only( [place]) -     fluconazole  (DIFLUCAN ) 150 MG tablet; Take 1 tablet (150 mg total) by mouth once for 1 dose. -     clotrimazole  (GYNE-LOTRIMIN ) 1 % vaginal cream; Place 1 Applicatorful vaginally at bedtime for 7 days.  Return if symptoms worsen or fail to improve.  Kieth JAYSON Carolin, MD

## 2023-01-28 LAB — CERVICOVAGINAL ANCILLARY ONLY
Bacterial Vaginitis (gardnerella): POSITIVE — AB
Candida Glabrata: NEGATIVE
Candida Vaginitis: NEGATIVE
Chlamydia: NEGATIVE
Comment: NEGATIVE
Comment: NEGATIVE
Comment: NEGATIVE
Comment: NEGATIVE
Comment: NEGATIVE
Comment: NORMAL
Neisseria Gonorrhea: NEGATIVE
Trichomonas: NEGATIVE

## 2023-01-28 MED ORDER — METRONIDAZOLE 0.75 % VA GEL: 1.0000 | Freq: Every day | VAGINAL | Status: DC

## 2023-01-28 NOTE — Addendum Note (Signed)
 Addended by: Harvie Bridge on: 01/28/2023 01:51 PM   Modules accepted: Orders

## 2023-01-31 ENCOUNTER — Encounter: Payer: Self-pay | Admitting: Obstetrics and Gynecology

## 2023-02-19 ENCOUNTER — Ambulatory Visit: Payer: Medicaid Other | Admitting: Physician Assistant

## 2023-02-23 ENCOUNTER — Ambulatory Visit: Payer: Medicaid Other | Admitting: Physician Assistant

## 2023-02-23 VITALS — BP 114/83 | HR 79 | Ht 59.0 in | Wt 143.0 lb

## 2023-02-23 DIAGNOSIS — K921 Melena: Secondary | ICD-10-CM | POA: Diagnosis not present

## 2023-02-23 DIAGNOSIS — R151 Fecal smearing: Secondary | ICD-10-CM | POA: Insufficient documentation

## 2023-02-23 DIAGNOSIS — R1010 Upper abdominal pain, unspecified: Secondary | ICD-10-CM | POA: Insufficient documentation

## 2023-02-23 DIAGNOSIS — R634 Abnormal weight loss: Secondary | ICD-10-CM | POA: Diagnosis not present

## 2023-02-23 DIAGNOSIS — R195 Other fecal abnormalities: Secondary | ICD-10-CM | POA: Insufficient documentation

## 2023-02-23 NOTE — Patient Instructions (Signed)
Will get labs.  Will get in with GI to consider colonoscopy

## 2023-02-23 NOTE — Progress Notes (Signed)
 Established Patient Office Visit  Subjective   Patient ID: Michelle Macias, female    DOB: 06-03-1990  Age: 33 y.o. MRN: [id number]  Chief Complaint  Patient presents with   Diarrhea    Bloody stools    Diarrhea  Associated symptoms include abdominal pain, vomiting and weight loss.    Pt is a 33 yo female presenting to the clinic today with complaints of abdominal pain, N/V/D, hematochezia, early satiety and bloating. Pt states that starting almost two weeks ago noticed that she was experiencing fecal incontinence. She states that she would have a BM which would be loose but then hours later when she would use the restroom she would notice a streak of loose stool in her underwear as well. This continued for about a week and a half but ceased approx. 4 days ago.   Since the fecal incontinence has ended, the pt has began to experience rectal bleeding. She states that she will notice a large amount of red blood in her underwear, and in the toilet, on toilet paper and in her stool after a bowel movement. She states that the amount is so much it is like a period. She states that she does have a hx of hemorrhoids and she gets them about 1-2x per month but she feels as if this is not related. She states that her anus does feel uncomfortable and she uses witch hazel wipes to help with the discomfort.  Pt has also been experiencing nausea with occasional vomiting for the last week and a half. She states that she has only been able to eat one meal a day because she experiences early satiety and nausea after eating. She also becomes bloated after trying to eat. Pt states that she has also had 20 lbs of unintentional weight loss since November.  Pt was [facility] at [facility] in the emergency department, on 1/14 for pyelonephritis in which she was prescribed cefpodoxime. An abdominal and pelvic CT was also completed with an impression of No bowel obstruction or inflammation. The appendix is seen in  the right lower quadrant and is normal-appearing.  No acute findings were noted.   Pt has also been experiencing RUQ abdominal pain when laying down and sitting up. She states this started about a week and a half ago when she was rolling over in bed and felt a tearing pain in her abdomen.  Review of Systems  Constitutional:  Positive for weight loss.  Gastrointestinal:  Positive for abdominal pain, blood in stool, diarrhea, nausea and vomiting.       Positive for early satiety and postprandial bloating  Genitourinary:  Negative for flank pain.  All other systems reviewed and are negative.  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   Lower respiratory infection 02/03/2022   Dizziness 05/29/2022   Upper abdominal pain 02/23/2023   Hematochezia 02/23/2023   Fecal smearing 02/23/2023   Unexplained weight loss 02/23/2023  Loose stools 02/23/2023   Resolved Ambulatory Problems    Diagnosis Date Noted   Pregnancy with history of pre-term labor 04/07/2011   Supervision of high-risk pregnancy 04/13/2011   Encounter for sterilization 12/08/2011   Postoperative pain 12/11/2011   Incisional infection 12/11/2011   History of hernia repair 04/24/2014   RUQ abdominal tenderness 04/24/2014   Umbilical pain 04/24/2014   Abdominal tenderness, RLQ (right lower quadrant) 04/24/2014   Right upper quadrant abdominal pain 06/25/2015    Atypical chest pain 10/08/2015   Shortness of breath 10/08/2015   Cough 10/08/2015   Generalized abdominal pain 01/02/2016   Abdominal pain, left lower quadrant 05/18/2017   Bilateral hand pain 02/17/2018   Possible pregnancy 02/17/2018   Bloating 04/24/2019   Non-intractable vomiting 04/24/2019   Diarrhea 04/24/2019   Miscarriage 06/25/2019   Right lower quadrant abdominal pain 09/18/2020   Epigastric pain 09/18/2020   Nausea and vomiting 09/18/2020   Acute left-sided low back pain without sciatica 01/21/2021   Less than [redacted] weeks gestation of pregnancy 01/21/2021   Ruptured ectopic pregnancy 01/22/2021   Past Medical History:  Diagnosis Date   Anemia    Ectopic pregnancy    H/O hernia repair    Hx of thrombocytopenia    Hypertension    Preterm labor    Tension headache      Objective:     BP 114/83   Pulse 79   Ht 4' 11 (1.499 m)   Wt 143 lb (64.9 kg)   LMP 01/21/2023   SpO2 99%   BMI 28.88 kg/m   BP Readings from Last 3 Encounters:  02/23/23 114/83  01/27/23 117/84  05/28/22 (!) 135/90   Wt Readings from Last 3 Encounters:  02/23/23 143 lb (64.9 kg)  01/27/23 145 lb (65.8 kg)  05/28/22 145 lb 0.6 oz (65.8 kg)    Physical Exam Exam conducted with a chaperone present.  Constitutional:      Appearance: Normal appearance.  HENT:     Head: Normocephalic.  Cardiovascular:     Rate and Rhythm: Normal rate and regular rhythm.     Heart sounds: Normal heart sounds.  Pulmonary:     Effort: Pulmonary effort is normal.     Breath sounds: Normal breath sounds.  Abdominal:     General: Bowel sounds are normal. There is no distension.     Palpations: Abdomen is soft.     Tenderness: There is abdominal tenderness in the right upper quadrant and left upper quadrant. There is no right CVA tenderness or left CVA tenderness.  Genitourinary:    Rectum: Guaiac result negative. Tenderness and external hemorrhoid (non-thrombosed) present.  Neurological:     General:  No focal deficit present.     Mental Status: She is alert and oriented to person, place, and time.  Psychiatric:        Mood and Affect: Mood normal.        Behavior: Behavior normal.        Assessment & Plan:   Michelle Macias was seen today for diarrhea.  Diagnoses and all orders for this visit:  Upper abdominal pain -     Sed Rate (ESR) -     C-reactive protein -     H. pylori breath test -     Celiac Disease Panel -     CMP14+EGFR -     CBC w/Diff/Platelet -     Cdiff NAA+O+P+Stool Culture -     Ambulatory referral to Gastroenterology  Hematochezia -  Sed Rate (ESR) -     C-reactive protein -     H. pylori breath test -     Celiac Disease Panel -     CMP14+EGFR -     CBC w/Diff/Platelet -     Cdiff NAA+O+P+Stool Culture -     Ambulatory referral to Gastroenterology  Fecal smearing -     Sed Rate (ESR) -     C-reactive protein -     H. pylori breath test -     Celiac Disease Panel -     CMP14+EGFR -     CBC w/Diff/Platelet -     Cdiff NAA+O+P+Stool Culture -     Ambulatory referral to Gastroenterology  Unexplained weight loss -     Sed Rate (ESR) -     C-reactive protein -     H. pylori breath test -     Celiac Disease Panel -     CMP14+EGFR -     CBC w/Diff/Platelet -     Cdiff NAA+O+P+Stool Culture -     Ambulatory referral to Gastroenterology  Loose stools -     Ambulatory referral to Gastroenterology   Unclear etiology of symptoms.   CT Abdomen and pelvis on 1/14 showed no acute findings Hemoccult negative No visible thrombosed external hemorrhoids seen on rectal exam  ESR/CRP ordered to evaluate for inflammation H.pylori and Celiac panel ordered d/t diarrhea, abd pain of unknown etiology and unintentional weight loss CBC/CMP ordered Cdiff and stool culture ordered d/t diarrhea  Upper right sided abdominal pain worse with use and movement I suspect to be pulled muscle due to occurring right after she felt a tear in her abdomen. Heat and  stretching recommended.  She has had some unintentional weight loss which is concerning.   Referral to GI for colonoscopy   Vermell Bologna, PA-C

## 2023-02-24 ENCOUNTER — Encounter: Payer: Self-pay | Admitting: Physician Assistant

## 2023-02-24 NOTE — Progress Notes (Signed)
Michelle Macias,   Kidney, liver, glucose look good.  Normal hemoglobin.  No evidence of inflammatory bowel with normal inflammatory labs H.pylori and celiac pending

## 2023-02-26 LAB — CMP14+EGFR
ALT: 9 [IU]/L (ref 0–32)
AST: 14 [IU]/L (ref 0–40)
Albumin: 4.6 g/dL (ref 3.9–4.9)
Alkaline Phosphatase: 116 [IU]/L (ref 44–121)
BUN/Creatinine Ratio: 14 (ref 9–23)
BUN: 10 mg/dL (ref 6–20)
Bilirubin Total: 0.2 mg/dL (ref 0.0–1.2)
CO2: 23 mmol/L (ref 20–29)
Calcium: 9.3 mg/dL (ref 8.7–10.2)
Chloride: 103 mmol/L (ref 96–106)
Creatinine, Ser: 0.7 mg/dL (ref 0.57–1.00)
Globulin, Total: 2.3 g/dL (ref 1.5–4.5)
Glucose: 94 mg/dL (ref 70–99)
Potassium: 4.2 mmol/L (ref 3.5–5.2)
Sodium: 140 mmol/L (ref 134–144)
Total Protein: 6.9 g/dL (ref 6.0–8.5)
eGFR: 117 mL/min/{1.73_m2} (ref >59)

## 2023-02-26 LAB — CBC WITH DIFFERENTIAL/PLATELET
Basophils Absolute: 0 10*3/uL (ref 0.0–0.2)
Basos: 1 %
EOS (ABSOLUTE): 0.1 10*3/uL (ref 0.0–0.4)
Eos: 1 %
Hematocrit: 37.9 % (ref 34.0–46.6)
Hemoglobin: 12.7 g/dL (ref 11.1–15.9)
Immature Grans (Abs): 0 10*3/uL (ref 0.0–0.1)
Immature Granulocytes: 0 %
Lymphocytes Absolute: 2.9 10*3/uL (ref 0.7–3.1)
Lymphs: 40 %
MCH: 30.1 pg (ref 26.6–33.0)
MCHC: 33.5 g/dL (ref 31.5–35.7)
MCV: 90 fL (ref 79–97)
Monocytes Absolute: 0.5 10*3/uL (ref 0.1–0.9)
Monocytes: 7 %
Neutrophils Absolute: 3.8 10*3/uL (ref 1.4–7.0)
Neutrophils: 51 %
Platelets: 175 10*3/uL (ref 150–450)
RBC: 4.22 x10E6/uL (ref 3.77–5.28)
RDW: 12.5 % (ref 11.7–15.4)
WBC: 7.3 10*3/uL (ref 3.4–10.8)

## 2023-02-26 LAB — CELIAC DISEASE PANEL
Endomysial IgA: NEGATIVE
IgA/Immunoglobulin A, Serum: 259 mg/dL (ref 87–352)
Transglutaminase IgA: <2 U/mL (ref 0–3)

## 2023-02-26 LAB — SEDIMENTATION RATE: Sed Rate: 6 mm/h (ref 0–32)

## 2023-02-26 LAB — C-REACTIVE PROTEIN: CRP: 1 mg/L (ref 0–10)

## 2023-02-26 LAB — H. PYLORI BREATH TEST: H pylori Breath Test: NEGATIVE

## 2023-02-26 NOTE — Progress Notes (Signed)
 Celiac disease and h pylori are negative.

## 2023-03-02 LAB — CDIFF NAA+O+P+STOOL CULTURE
E coli, Shiga toxin Assay: NEGATIVE
Toxigenic C. Difficile by PCR: NEGATIVE
Toxigenic C. Difficile by PCR: NEGATIVE

## 2023-03-02 LAB — SPECIMEN STATUS REPORT

## 2023-03-02 NOTE — Progress Notes (Signed)
Normal stool culture, negative for C.diff or parasites.

## 2023-06-22 ENCOUNTER — Ambulatory Visit (INDEPENDENT_AMBULATORY_CARE_PROVIDER_SITE_OTHER): Admitting: Physician Assistant

## 2023-06-22 VITALS — BP 130/85 | HR 72 | Ht 59.0 in | Wt 150.0 lb

## 2023-06-22 DIAGNOSIS — F419 Anxiety disorder, unspecified: Secondary | ICD-10-CM | POA: Insufficient documentation

## 2023-06-22 DIAGNOSIS — Z1159 Encounter for screening for other viral diseases: Secondary | ICD-10-CM | POA: Diagnosis not present

## 2023-06-22 DIAGNOSIS — K219 Gastro-esophageal reflux disease without esophagitis: Secondary | ICD-10-CM

## 2023-06-22 DIAGNOSIS — Z0000 Encounter for general adult medical examination without abnormal findings: Secondary | ICD-10-CM

## 2023-06-22 DIAGNOSIS — Z13220 Encounter for screening for lipoid disorders: Secondary | ICD-10-CM | POA: Diagnosis not present

## 2023-06-22 DIAGNOSIS — G479 Sleep disorder, unspecified: Secondary | ICD-10-CM | POA: Insufficient documentation

## 2023-06-22 DIAGNOSIS — F430 Acute stress reaction: Secondary | ICD-10-CM

## 2023-06-22 DIAGNOSIS — Z131 Encounter for screening for diabetes mellitus: Secondary | ICD-10-CM

## 2023-06-22 DIAGNOSIS — E559 Vitamin D deficiency, unspecified: Secondary | ICD-10-CM | POA: Diagnosis not present

## 2023-06-22 MED ORDER — SERTRALINE HCL 50 MG PO TABS: 50.0000 mg | ORAL_TABLET | Freq: Every day | ORAL | 1 refills | Status: DC

## 2023-06-22 MED ORDER — PANTOPRAZOLE SODIUM 40 MG PO TBEC
40.0000 mg | DELAYED_RELEASE_TABLET | Freq: Every day | ORAL | 3 refills | Status: AC
Start: 2023-06-22 — End: 2023-07-22

## 2023-06-22 NOTE — Patient Instructions (Addendum)
 Flonase 2 sprays each nostril to help with right ear pain  Start zoloft daily at night Consider valarian root for sleep    Generalized Anxiety Disorder, Adult Generalized anxiety disorder (GAD) is a mental health condition. Unlike normal worries, anxiety related to GAD is not triggered by a specific event. These worries do not fade or get better with time. GAD interferes with relationships, work, and school. GAD symptoms can vary from mild to severe. People with severe GAD can have intense waves of anxiety with physical symptoms that are similar to panic attacks. What are the causes? The exact cause of GAD is not known, but the following are believed to have an impact: Differences in natural brain chemicals. Genes passed down from parents to children. Differences in the way threats are perceived. Development and stress during childhood. Personality. What increases the risk? The following factors may make you more likely to develop this condition: Being female. Having a family history of anxiety disorders. Being very shy. Experiencing very stressful life events, such as the death of a loved one. Having a very stressful family environment. What are the signs or symptoms? People with GAD often worry excessively about many things in their lives, such as their health and family. Symptoms may also include: Mental and emotional symptoms: Worrying excessively about natural disasters. Fear of being late. Difficulty concentrating. Fears that others are judging your performance. Physical symptoms: Fatigue. Headaches, muscle tension, muscle twitches, trembling, or feeling shaky. Feeling like your heart is pounding or beating very fast. Feeling out of breath or like you cannot take a deep breath. Having trouble falling asleep or staying asleep, or experiencing restlessness. Sweating. Nausea, diarrhea, or irritable bowel syndrome (IBS). Behavioral symptoms: Experiencing erratic moods or  irritability. Avoidance of new situations. Avoidance of people. Extreme difficulty making decisions. How is this diagnosed? This condition is diagnosed based on your symptoms and medical history. You will also have a physical exam. Your health care provider may perform tests to rule out other possible causes of your symptoms. To be diagnosed with GAD, a person must have anxiety that: Is out of his or her control. Affects several different aspects of his or her life, such as work and relationships. Causes distress that makes him or her unable to take part in normal activities. Includes at least three symptoms of GAD, such as restlessness, fatigue, trouble concentrating, irritability, muscle tension, or sleep problems. Before your health care provider can confirm a diagnosis of GAD, these symptoms must be present more days than they are not, and they must last for 6 months or longer. How is this treated? This condition may be treated with: Medicine. Antidepressant medicine is usually prescribed for long-term daily control. Anti-anxiety medicines may be added in severe cases, especially when panic attacks occur. Talk therapy (psychotherapy). Certain types of talk therapy can be helpful in treating GAD by providing support, education, and guidance. Options include: Cognitive behavioral therapy (CBT). People learn coping skills and self-calming techniques to ease their physical symptoms. They learn to identify unrealistic thoughts and behaviors and to replace them with more appropriate thoughts and behaviors. Acceptance and commitment therapy (ACT). This treatment teaches people how to be mindful as a way to cope with unwanted thoughts and feelings. Biofeedback. This process trains you to manage your body's response (physiological response) through breathing techniques and relaxation methods. You will work with a therapist while machines are used to monitor your physical symptoms. Stress management  techniques. These include yoga, meditation, and exercise. A  mental health specialist can help determine which treatment is best for you. Some people see improvement with one type of therapy. However, other people require a combination of therapies. Follow these instructions at home: Lifestyle Maintain a consistent routine and schedule. Anticipate stressful situations. Create a plan and allow extra time to work with your plan. Practice stress management or self-calming techniques that you have learned from your therapist or your health care provider. Exercise regularly and spend time outdoors. Eat a healthy diet that includes plenty of vegetables, fruits, whole grains, low-fat dairy products, and lean protein. Do not eat a lot of foods that are high in fat, added sugar, or salt (sodium). Drink plenty of water. Avoid alcohol. Alcohol can increase anxiety. Avoid caffeine and certain over-the-counter cold medicines. These may make you feel worse. Ask your pharmacist which medicines to avoid. General instructions Take over-the-counter and prescription medicines only as told by your health care provider. Understand that you are likely to have setbacks. Accept this and be kind to yourself as you persist to take better care of yourself. Anticipate stressful situations. Create a plan and allow extra time to work with your plan. Recognize and accept your accomplishments, even if you judge them as small. Spend time with people who care about you. Keep all follow-up visits. This is important. Where to find more information [employer] of [employer]: http://www.maynard.net/ Substance Abuse and Mental Health Services: [employer].pt Contact a health care provider if: Your symptoms do not get better. Your symptoms get worse. You have signs of depression, such as: A persistently sad or irritable mood. Loss of enjoyment in activities that used to bring you joy. Change in weight or eating. Changes in  sleeping habits. Get help right away if: You have thoughts about hurting yourself or others. If you ever feel like you may hurt yourself or others, or have thoughts about taking your own life, get help right away. Go to your nearest emergency department or: Call your local emergency services (911 in the [place] Call a suicide crisis helpline, such as the National Suicide Prevention Lifeline at 607-531-3260 or 988 in the [place] This is open 24 hours a day in the [place] If you're a Veteran: Call 988 and press 1. This is open 24 hours a day. Text the [employer] at 604-318-7798. Summary Generalized anxiety disorder (GAD) is a mental health condition that involves worry that is not triggered by a specific event. People with GAD often worry excessively about many things in their lives, such as their health and family. GAD may cause symptoms such as restlessness, trouble concentrating, sleep problems, frequent sweating, nausea, diarrhea, headaches, and trembling or muscle twitching. A mental health specialist can help determine which treatment is best for you. Some people see improvement with one type of therapy. However, other people require a combination of therapies. This information is not intended to replace advice given to you by your health care provider. Make sure you discuss any questions you have with your health care provider. Document Revised: 08/20/2022 Document Reviewed: 04/28/2020 Elsevier Patient Education  2024 Else[employer] Inc.   Health Maintenance, Female Adopting a healthy lifestyle and getting preventive care are important in promoting health and wellness. Ask your health care provider about: The right schedule for you to have regular tests and exams. Things you can do on your own to prevent diseases and keep yourself healthy. What should I know about diet, weight, and exercise? Eat a healthy diet  Eat a diet that includes  plenty of vegetables, fruits, low-fat dairy products,  and lean protein. Do not eat a lot of foods that are high in solid fats, added sugars, or sodium. Maintain a healthy weight Body mass index (BMI) is used to identify weight problems. It estimates body fat based on height and weight. Your health care provider can help determine your BMI and help you achieve or maintain a healthy weight. Get regular exercise Get regular exercise. This is one of the most important things you can do for your health. Most adults should: Exercise for at least 150 minutes each week. The exercise should increase your heart rate and make you sweat (moderate-intensity exercise). Do strengthening exercises at least twice a week. This is in addition to the moderate-intensity exercise. Spend less time sitting. Even light physical activity can be beneficial. Watch cholesterol and blood lipids Have your blood tested for lipids and cholesterol at 35 years of age, then have this test every 5 years. Have your cholesterol levels checked more often if: Your lipid or cholesterol levels are high. You are older than 35 years of age. You are at high risk for heart disease. What should I know about cancer screening? Depending on your health history and family history, you may need to have cancer screening at various ages. This may include screening for: Breast cancer. Cervical cancer. Colorectal cancer. Skin cancer. Lung cancer. What should I know about heart disease, diabetes, and high blood pressure? Blood pressure and heart disease High blood pressure causes heart disease and increases the risk of stroke. This is more likely to develop in people who have high blood pressure readings or are overweight. Have your blood pressure checked: Every 3-5 years if you are 90-81 years of age. Every year if you are 36 years old or older. Diabetes Have regular diabetes screenings. This checks your fasting blood sugar level. Have the screening done: Once every three years after age 42 if  you are at a normal weight and have a low risk for diabetes. More often and at a younger age if you are overweight or have a high risk for diabetes. What should I know about preventing infection? Hepatitis B If you have a higher risk for hepatitis B, you should be screened for this virus. Talk with your health care provider to find out if you are at risk for hepatitis B infection. Hepatitis C Testing is recommended for: Everyone born from [birth year] through 1966. Anyone with known risk factors for hepatitis C. Sexually transmitted infections (STIs) Get screened for STIs, including gonorrhea and chlamydia, if: You are sexually active and are younger than 34 years of age. You are older than 34 years of age and your health care provider tells you that you are at risk for this type of infection. Your sexual activity has changed since you were last screened, and you are at increased risk for chlamydia or gonorrhea. Ask your health care provider if you are at risk. Ask your health care provider about whether you are at high risk for HIV. Your health care provider may recommend a prescription medicine to help prevent HIV infection. If you choose to take medicine to prevent HIV, you should first get tested for HIV. You should then be tested every 3 months for as long as you are taking the medicine. Pregnancy If you are about to stop having your period (premenopausal) and you may become pregnant, seek counseling before you get pregnant. Take 400 to 800 micrograms (mcg) of folic acid every day  if you become pregnant. Ask for birth control (contraception) if you want to prevent pregnancy. Osteoporosis and menopause Osteoporosis is a disease in which the bones lose minerals and strength with aging. This can result in bone fractures. If you are 7 years old or older, or if you are at risk for osteoporosis and fractures, ask your health care provider if you should: Be screened for bone loss. Take a calcium or  vitamin D  supplement to lower your risk of fractures. Be given hormone replacement therapy (HRT) to treat symptoms of menopause. Follow these instructions at home: Alcohol use Do not drink alcohol if: Your health care provider tells you not to drink. You are pregnant, may be pregnant, or are planning to become pregnant. If you drink alcohol: Limit how much you have to: 0-1 drink a day. Know how much alcohol is in your drink. In the [place] one drink equals one 12 oz bottle of beer (355 mL), one 5 oz glass of wine (148 mL), or one 1 oz glass of hard liquor (44 mL). Lifestyle Do not use any products that contain nicotine or tobacco. These products include cigarettes, chewing tobacco, and vaping devices, such as e-cigarettes. If you need help quitting, ask your health care provider. Do not use street drugs. Do not share needles. Ask your health care provider for help if you need support or information about quitting drugs. General instructions Schedule regular health, dental, and eye exams. Stay current with your vaccines. Tell your health care provider if: You often feel depressed. You have ever been abused or do not feel safe at home. Summary Adopting a healthy lifestyle and getting preventive care are important in promoting health and wellness. Follow your health care provider's instructions about healthy diet, exercising, and getting tested or screened for diseases. Follow your health care provider's instructions on monitoring your cholesterol and blood pressure. This information is not intended to replace advice given to you by your health care provider. Make sure you discuss any questions you have with your health care provider. Document Revised: 05/27/2020 Document Reviewed: 05/27/2020 Elsevier Patient Education  2024 [employer].

## 2023-06-22 NOTE — Progress Notes (Unsigned)
 Complete physical exam  Patient: Michelle Macias   DOB: 18-Jun-1990   33 y.o. Female  MRN: [id number]  Subjective:     No chief complaint on file.   Michelle Macias is a 33 y.o. female who presents today for a complete physical exam. She reports consuming a {diet types:17450} diet. {types:19826} She generally feels {DESC; WELL/FAIRLY WELL/POORLY:18703}. She reports sleeping {DESC; WELL/FAIRLY WELL/POORLY:18703}. She {does/does not:200015} have additional problems to discuss today.    Most recent fall risk assessment:    05/28/2022   10:27 AM  Fall Risk   Falls in the past year? 0  Number falls in past yr: 0  Injury with Fall? 0  Risk for fall due to : No Fall Risks  Follow up Falls evaluation completed     Most recent depression screenings:    05/28/2022   10:28 AM 07/16/2020    9:20 AM  PHQ 2/9 Scores  PHQ - 2 Score 0 1  PHQ- 9 Score  5    {VISON DENTAL STD PSA (Optional):27386}  {History (Optional):23778}  Patient Care Team: Righteous Claiborne L, PA-C as PCP - General (Family Medicine) Charley Constable, MD as Consulting Physician (Nephrology)   Outpatient Medications Prior to Visit  Medication Sig   albuterol  (VENTOLIN  HFA) 108 (90 Base) MCG/ACT inhaler Inhale 2 puffs into the lungs every 4 (four) hours as needed for wheezing or shortness of breath.   Multiple Vitamin (MULTIVITAMIN) tablet Take 1 tablet by mouth daily.   ondansetron  (ZOFRAN -ODT) 4 MG disintegrating tablet Take 1 tablet (4 mg total) by mouth every 8 (eight) hours as needed for nausea or vomiting.   pantoprazole  (PROTONIX ) 40 MG tablet Take 1 tablet (40 mg total) by mouth daily.   Pumpkin Seed-Soy Germ (AZO BLADDER CONTROL/GO-LESS PO) Take by mouth.   [DISCONTINUED] meclizine  (ANTIVERT ) 25 MG tablet Take 1 tablet (25 mg total) by mouth 3 (three) times daily as needed for dizziness.   [DISCONTINUED] metroNIDAZOLE  (METROGEL ) 0.75 % vaginal gel Place 1 Applicatorful vaginally at bedtime. Apply one  applicatorful to vagina at bedtime for 5 days   No facility-administered medications prior to visit.    ROS        Objective:     BP 130/85   Pulse 72   Ht 4\' 11"  (1.499 m)   Wt 150 lb (68 kg)   SpO2 99%   BMI 30.30 kg/m  {Vitals History (Optional):23777}  Physical Exam   No results found for any visits on 06/22/23. {Show previous labs (optional):23779}    Assessment & Plan:    Routine Health Maintenance and Physical Exam  Immunization History  Administered Date(s) Administered   Influenza Split 10/19/2011   Influenza, Quadrivalent, Recombinant, Inj, Pf 01/25/2017   Influenza,inj,Quad PF,6+ Mos 11/11/2016   PFIZER Comirnaty(Gray Top)Covid-19 Tri-Sucrose Vaccine 06/01/2020   PFIZER(Purple Top)SARS-COV-2 Vaccination 07/20/2019, 08/16/2019   Tdap 06/29/2011    Health Maintenance  Topic Date Due   Hepatitis C Screening  Never done   DTaP/Tdap/Td (2 - Td or Tdap) 06/28/2021   COVID-19 Vaccine (4 - 2024-25 season) 07/08/2023 (Originally 09/20/2022)   INFLUENZA VACCINE  08/20/2023   Cervical Cancer Screening (HPV/Pap Cotest)  07/18/2026   HIV Screening  Completed   HPV VACCINES  Aged Out   Meningococcal B Vaccine  Aged Out    Discussed health benefits of physical activity, and encouraged her to engage in regular exercise appropriate for her age and condition.  Therapy.   Account no concentrate  No follow-ups on  file.     Markie Heffernan, PA-C

## 2023-06-23 ENCOUNTER — Encounter: Payer: Self-pay | Admitting: Physician Assistant

## 2023-06-23 ENCOUNTER — Ambulatory Visit: Payer: Self-pay | Admitting: Physician Assistant

## 2023-06-23 DIAGNOSIS — K219 Gastro-esophageal reflux disease without esophagitis: Secondary | ICD-10-CM | POA: Insufficient documentation

## 2023-06-23 LAB — CMP14+EGFR
ALT: 13 IU/L (ref 0–32)
AST: 17 IU/L (ref 0–40)
Albumin: 4.3 g/dL (ref 3.9–4.9)
Alkaline Phosphatase: 109 IU/L (ref 44–121)
BUN/Creatinine Ratio: 18 (ref 9–23)
BUN: 11 mg/dL (ref 6–20)
Bilirubin Total: 0.3 mg/dL (ref 0.0–1.2)
CO2: 20 mmol/L (ref 20–29)
Calcium: 9.3 mg/dL (ref 8.7–10.2)
Chloride: 102 mmol/L (ref 96–106)
Creatinine, Ser: 0.61 mg/dL (ref 0.57–1.00)
Globulin, Total: 2.9 g/dL (ref 1.5–4.5)
Glucose: 83 mg/dL (ref 70–99)
Potassium: 4.3 mmol/L (ref 3.5–5.2)
Sodium: 138 mmol/L (ref 134–144)
Total Protein: 7.2 g/dL (ref 6.0–8.5)
eGFR: 120 mL/min/{1.73_m2} (ref >59)

## 2023-06-23 LAB — HEPATITIS C ANTIBODY: Hep C Virus Ab: NONREACTIVE

## 2023-06-23 LAB — CBC WITH DIFFERENTIAL/PLATELET
Basophils Absolute: 0 10*3/uL (ref 0.0–0.2)
Basos: 1 %
EOS (ABSOLUTE): 0.1 10*3/uL (ref 0.0–0.4)
Eos: 1 %
Hematocrit: 40.1 % (ref 34.0–46.6)
Hemoglobin: 13 g/dL (ref 11.1–15.9)
Immature Grans (Abs): 0 10*3/uL (ref 0.0–0.1)
Immature Granulocytes: 0 %
Lymphocytes Absolute: 2.5 10*3/uL (ref 0.7–3.1)
Lymphs: 48 %
MCH: 30.4 pg (ref 26.6–33.0)
MCHC: 32.4 g/dL (ref 31.5–35.7)
MCV: 94 fL (ref 79–97)
Monocytes Absolute: 0.5 10*3/uL (ref 0.1–0.9)
Monocytes: 9 %
Neutrophils Absolute: 2.2 10*3/uL (ref 1.4–7.0)
Neutrophils: 41 %
Platelets: 174 10*3/uL (ref 150–450)
RBC: 4.27 x10E6/uL (ref 3.77–5.28)
RDW: 12.3 % (ref 11.7–15.4)
WBC: 5.3 10*3/uL (ref 3.4–10.8)

## 2023-06-23 LAB — LIPID PANEL
Chol/HDL Ratio: 4.4 ratio (ref 0.0–4.4)
Cholesterol, Total: 246 mg/dL — ABNORMAL HIGH (ref 100–199)
HDL: 56 mg/dL (ref >39)
LDL Chol Calc (NIH): 161 mg/dL — ABNORMAL HIGH (ref 0–99)
Triglycerides: 162 mg/dL — ABNORMAL HIGH (ref 0–149)
VLDL Cholesterol Cal: 29 mg/dL (ref 5–40)

## 2023-06-23 LAB — TSH: TSH: 1.68 u[IU]/mL (ref 0.450–4.500)

## 2023-06-23 LAB — VITAMIN D 25 HYDROXY (VIT D DEFICIENCY, FRACTURES): Vit D, 25-Hydroxy: 31.7 ng/mL (ref 30.0–100.0)

## 2023-06-23 NOTE — Progress Notes (Signed)
 Michelle Macias,   Hemoglobin looks good.  Kidney, liver, glucose look good.  Thyroid looks great.  TG and LDL elevated.  Need to start with low fat diet and decreased fried/fatty foods. Regular exercise could help to lower cholesterol as well.

## 2023-07-14 ENCOUNTER — Other Ambulatory Visit: Payer: Self-pay | Admitting: Physician Assistant

## 2023-07-14 DIAGNOSIS — F419 Anxiety disorder, unspecified: Secondary | ICD-10-CM

## 2023-08-24 ENCOUNTER — Ambulatory Visit: Admitting: Physician Assistant

## 2023-09-08 ENCOUNTER — Ambulatory Visit: Admitting: Physician Assistant

## 2023-09-13 ENCOUNTER — Ambulatory Visit: Admitting: Physician Assistant

## 2023-09-17 ENCOUNTER — Ambulatory Visit: Admitting: Physician Assistant

## 2023-09-17 ENCOUNTER — Encounter: Payer: Self-pay | Admitting: Physician Assistant

## 2023-09-17 VITALS — BP 120/83 | HR 81 | Ht 59.0 in | Wt 150.0 lb

## 2023-09-17 DIAGNOSIS — M79601 Pain in right arm: Secondary | ICD-10-CM | POA: Diagnosis not present

## 2023-09-17 DIAGNOSIS — R202 Paresthesia of skin: Secondary | ICD-10-CM

## 2023-09-17 DIAGNOSIS — F419 Anxiety disorder, unspecified: Secondary | ICD-10-CM | POA: Diagnosis not present

## 2023-09-17 DIAGNOSIS — M79641 Pain in right hand: Secondary | ICD-10-CM | POA: Diagnosis not present

## 2023-09-17 DIAGNOSIS — R200 Anesthesia of skin: Secondary | ICD-10-CM | POA: Diagnosis not present

## 2023-09-17 MED ORDER — PREDNISONE 50 MG PO TABS
ORAL_TABLET | ORAL | 0 refills | Status: DC
Start: 2023-09-17 — End: 2023-11-30

## 2023-09-17 NOTE — Progress Notes (Signed)
 Established Patient Office Visit  Subjective   Patient ID: RAMON BRANT, female    DOB: Jun 20, 1990  Age: 33 y.o. MRN: [id number]   HPI Pt is a 33 yo female who presents to the clinic to follow up on anxiety. She was started on zoloft  at last visit. She has done really well. She has reported decrease in anxiety and depression symptoms. No side effects. She feels like current dose is really good. She feels like she is able to handle her current stressors.   She went to the ED on 8/25 for sudden right arm and hand pain. No trauma or injury. She had swelling only in right hand. Pain radiates up from right hand into right shoulder. She denies any neck pain. She does have some tingling sensation. Her hand grip is decreased. Muscle relaxer's did not help.   When she was younger they thought she could have rheumatoid arthritis.    ROS See HPI.    Objective:     BP 120/83   Pulse 81   Ht 4' 11 (1.499 m)   Wt 150 lb (68 kg)   SpO2 99%   BMI 30.30 kg/m  BP Readings from Last 3 Encounters:  09/17/23 120/83  06/22/23 130/85  02/23/23 114/83   Wt Readings from Last 3 Encounters:  09/17/23 150 lb (68 kg)  06/22/23 150 lb (68 kg)  02/23/23 143 lb (64.9 kg)   ..    09/17/2023   11:48 AM 06/23/2023    6:46 AM 06/22/2023   11:21 AM 05/28/2022   10:28 AM 07/16/2020    9:20 AM  Depression screen PHQ 2/9  Decreased Interest 0 1 1 0 1  Down, Depressed, Hopeless 0 1 0 0 0  PHQ - 2 Score 0 2 1 0 1  Altered sleeping 0 3 2  1   Tired, decreased energy 2 3 2  1   Change in appetite 0 1 2  1   Feeling bad or failure about yourself  0 1 0  0  Trouble concentrating 1 3 2  1   Moving slowly or fidgety/restless 0 0 0  0  Suicidal thoughts  0 0  0  PHQ-9 Score 3 13 9  5   Difficult doing work/chores Not difficult at all Very difficult Very difficult  Somewhat difficult   ..    09/17/2023   11:49 AM 06/23/2023    6:45 AM 06/22/2023   11:21 AM 07/16/2020    9:22 AM  GAD 7 : Generalized Anxiety  Score  Nervous, Anxious, on Edge 1 2 2 1   Control/stop worrying 1 2 1 3   Worry too much - different things 1 2 2 3   Trouble relaxing 1 2 3 3   Restless 1 2 2 3   Easily annoyed or irritable 1 2 2 3   Afraid - awful might happen 0 2 1 0  Total GAD 7 Score 6 14 13 16   Anxiety Difficulty Not difficult at all Very difficult Very difficult         Physical Exam Constitutional:      Appearance: Normal appearance.  HENT:     Head: Normocephalic.  Cardiovascular:     Rate and Rhythm: Normal rate and regular rhythm.     Pulses: Normal pulses.  Pulmonary:     Effort: Pulmonary effort is normal.     Breath sounds: Normal breath sounds.  Musculoskeletal:     Comments: No overt swelling of right hand No warmth/redness to palpation Decreased right hand  grip 3/5 Tenderness over some joints of right hand NROM of right shoulder/wrist/elbows NROM of neck   Neurological:     General: No focal deficit present.     Mental Status: She is alert and oriented to person, place, and time.  Psychiatric:        Mood and Affect: Mood normal.        Assessment & Plan:  SABRASABRACianni was seen today for medical management of chronic issues.  Diagnoses and all orders for this visit:  Anxiety  Right arm pain -     ANA,IFA RA Diag Pnl w/rflx Tit/Patn -     CBC w/Diff/Platelet -     CMP14+EGFR -     TSH + free T4 -     Sed Rate (ESR) -     C-reactive protein -     predniSONE  (DELTASONE ) 50 MG tablet; One tab PO daily for 5 days. -     FANA Staining Patterns  Numbness and tingling of right arm -     ANA,IFA RA Diag Pnl w/rflx Tit/Patn -     CBC w/Diff/Platelet -     CMP14+EGFR -     TSH + free T4 -     Sed Rate (ESR) -     C-reactive protein -     predniSONE  (DELTASONE ) 50 MG tablet; One tab PO daily for 5 days. -     FANA Staining Patterns  Right hand pain -     ANA,IFA RA Diag Pnl w/rflx Tit/Patn -     CBC w/Diff/Platelet -     CMP14+EGFR -     TSH + free T4 -     Sed Rate (ESR) -      C-reactive protein -     predniSONE  (DELTASONE ) 50 MG tablet; One tab PO daily for 5 days. -     FANA Staining Patterns   PHQ/GAD improved significantly Pt does feel like this is a great dose of medication for her mood Zoloft  50mg  to continue, looks like she has refills at pharmacy Follow up in 6 months  Unclear etiology of right hand pain No injury-imaging in ED no acute findings Does not sound like radicular pain from neck shoulder ? Autoimmune causes Labs to rule out RA Trial of prednisone  burst to see if helps with pain and symptoms Consider sports medicine referral   Return if symptoms worsen or fail to improve.    Gotham Raden, PA-C

## 2023-09-17 NOTE — Patient Instructions (Signed)
 Will get labs today.  Start prednisone  burst.  ? Sports medicine if not improving.  Continue zoloft .

## 2023-09-21 ENCOUNTER — Encounter: Payer: Self-pay | Admitting: Physician Assistant

## 2023-09-21 ENCOUNTER — Ambulatory Visit: Payer: Self-pay | Admitting: Physician Assistant

## 2023-09-21 DIAGNOSIS — R2231 Localized swelling, mass and lump, right upper limb: Secondary | ICD-10-CM | POA: Insufficient documentation

## 2023-09-21 DIAGNOSIS — R768 Other specified abnormal immunological findings in serum: Secondary | ICD-10-CM | POA: Insufficient documentation

## 2023-09-21 DIAGNOSIS — M79641 Pain in right hand: Secondary | ICD-10-CM

## 2023-09-21 DIAGNOSIS — R200 Anesthesia of skin: Secondary | ICD-10-CM | POA: Insufficient documentation

## 2023-09-21 DIAGNOSIS — M79601 Pain in right arm: Secondary | ICD-10-CM

## 2023-09-21 NOTE — Telephone Encounter (Signed)
 Pended referral

## 2023-09-21 NOTE — Progress Notes (Signed)
 Michelle Macias,   Inflammatory marker (ESR)up a little from 7 months ago.  C- reactive protein normal.  Thyroid looks good.  ANA antibodies positive. This means there is some evidence of potential autoimmune disease. I would like to send you to rheumatology. Any location preference?

## 2023-09-22 LAB — SEDIMENTATION RATE: Sed Rate: 18 mm/h (ref 0–32)

## 2023-09-22 LAB — CBC WITH DIFFERENTIAL/PLATELET
Basophils Absolute: 0 x10E3/uL (ref 0.0–0.2)
Basos: 1 %
EOS (ABSOLUTE): 0.1 x10E3/uL (ref 0.0–0.4)
Eos: 1 %
Hematocrit: 39.2 % (ref 34.0–46.6)
Hemoglobin: 12.8 g/dL (ref 11.1–15.9)
Immature Grans (Abs): 0 x10E3/uL (ref 0.0–0.1)
Immature Granulocytes: 0 %
Lymphocytes Absolute: 2.9 x10E3/uL (ref 0.7–3.1)
Lymphs: 43 %
MCH: 29.9 pg (ref 26.6–33.0)
MCHC: 32.7 g/dL (ref 31.5–35.7)
MCV: 92 fL (ref 79–97)
Monocytes Absolute: 0.5 x10E3/uL (ref 0.1–0.9)
Monocytes: 7 %
Neutrophils Absolute: 3.2 x10E3/uL (ref 1.4–7.0)
Neutrophils: 48 %
Platelets: 176 x10E3/uL (ref 150–450)
RBC: 4.28 x10E6/uL (ref 3.77–5.28)
RDW: 12.4 % (ref 11.7–15.4)
WBC: 6.7 x10E3/uL (ref 3.4–10.8)

## 2023-09-22 LAB — CMP14+EGFR
ALT: 15 IU/L (ref 0–32)
AST: 16 IU/L (ref 0–40)
Albumin: 4.3 g/dL (ref 3.9–4.9)
Alkaline Phosphatase: 95 IU/L (ref 44–121)
BUN/Creatinine Ratio: 14 (ref 9–23)
BUN: 8 mg/dL (ref 6–20)
Bilirubin Total: 0.3 mg/dL (ref 0.0–1.2)
CO2: 21 mmol/L (ref 20–29)
Calcium: 9.2 mg/dL (ref 8.7–10.2)
Chloride: 101 mmol/L (ref 96–106)
Creatinine, Ser: 0.59 mg/dL (ref 0.57–1.00)
Globulin, Total: 2.7 g/dL (ref 1.5–4.5)
Glucose: 91 mg/dL (ref 70–99)
Potassium: 3.8 mmol/L (ref 3.5–5.2)
Sodium: 139 mmol/L (ref 134–144)
Total Protein: 7 g/dL (ref 6.0–8.5)
eGFR: 121 mL/min/1.73 (ref >59)

## 2023-09-22 LAB — ANA,IFA RA DIAG PNL W/RFLX TIT/PATN
ANA Titer 1: POSITIVE — AB
Cyclic Citrullin Peptide Ab: 5 U (ref 0–19)
Rheumatoid fact SerPl-aCnc: <10 [IU]/mL (ref <14.0)

## 2023-09-22 LAB — TSH+FREE T4
Free T4: 1.15 ng/dL (ref 0.82–1.77)
TSH: 1.66 u[IU]/mL (ref 0.450–4.500)

## 2023-09-22 LAB — FANA STAINING PATTERNS: Speckled Pattern: 1:160 {titer} — ABNORMAL HIGH

## 2023-09-22 LAB — C-REACTIVE PROTEIN: CRP: 1 mg/L (ref 0–10)

## 2023-11-26 ENCOUNTER — Ambulatory Visit: Payer: Self-pay

## 2023-11-26 NOTE — Telephone Encounter (Signed)
 Can we triage this just to see if she needs to be seen sooner?

## 2023-11-26 NOTE — Telephone Encounter (Signed)
Patient scheduled for Tuesday

## 2023-11-26 NOTE — Telephone Encounter (Signed)
 FYI Only or Action Required?: FYI only for provider: Declined scheduling at this time.  Patient was last seen in primary care on 09/17/2023 by Antoniette Vermell CROME, PA-C.  Called Nurse Triage reporting Hypertension.  Symptoms began 10 days ago.  Interventions attempted: Nothing.  Symptoms are: stable.  Triage Disposition: See PCP Within 2 Weeks  Patient/caregiver understands and will follow disposition?: No Reason for Disposition  [1] Systolic BP >= 130 OR Diastolic >= 80 AND [2] not taking BP medications  Answer Assessment - Initial Assessment Questions Patient states she sees rheumatologist for back and neck pain, got an MRI done and is awaiting results. They noticed the BP and advised patient to keep a log of it. Takes Tylenol  for back pain and doesn't get much relief. Patient declined scheduling appointment at this time and stated she will call back when she feels she needs to.  1. BLOOD PRESSURE: What is your blood pressure? Did you take at least two measurements 5 minutes apart?     Fluctuating from 126/98-136-102  2. ONSET: When did you take your blood pressure?     Started noticing it 10 days ago  3. HOW: How did you take your blood pressure? (e.g., automatic home BP monitor, visiting nurse)     Automatic BP monitor at home  4. HISTORY: Do you have a history of high blood pressure?     Denies  5. MEDICINES: Are you taking any medicines for blood pressure? Have you missed any doses recently?     Denies  6. OTHER SYMPTOMS: Do you have any symptoms? (e.g., blurred vision, chest pain, difficulty breathing, headache, weakness)     Headache and dizziness  Protocols used: Blood Pressure - High-A-AH  Copied from CRM #[id number]. Topic: Clinical - Red Word Triage >> Nov 26, 2023 10:53 AM Mercer PEDLAR wrote: Red Word that prompted transfer to Nurse Triage:  Patient stated that she has had high BP readings for past 10 days and severe pain in back of neck for 10 days as  well.

## 2023-11-30 ENCOUNTER — Ambulatory Visit (INDEPENDENT_AMBULATORY_CARE_PROVIDER_SITE_OTHER): Admitting: Physician Assistant

## 2023-11-30 ENCOUNTER — Other Ambulatory Visit: Payer: Self-pay

## 2023-11-30 ENCOUNTER — Encounter: Payer: Self-pay | Admitting: Physician Assistant

## 2023-11-30 VITALS — BP 122/88 | HR 80 | Ht 59.0 in | Wt 160.0 lb

## 2023-11-30 DIAGNOSIS — M79602 Pain in left arm: Secondary | ICD-10-CM

## 2023-11-30 DIAGNOSIS — R251 Tremor, unspecified: Secondary | ICD-10-CM | POA: Diagnosis not present

## 2023-11-30 DIAGNOSIS — G8929 Other chronic pain: Secondary | ICD-10-CM

## 2023-11-30 DIAGNOSIS — R202 Paresthesia of skin: Secondary | ICD-10-CM | POA: Insufficient documentation

## 2023-11-30 DIAGNOSIS — R030 Elevated blood-pressure reading, without diagnosis of hypertension: Secondary | ICD-10-CM

## 2023-11-30 DIAGNOSIS — M542 Cervicalgia: Secondary | ICD-10-CM

## 2023-11-30 DIAGNOSIS — M79601 Pain in right arm: Secondary | ICD-10-CM

## 2023-11-30 MED ORDER — HYDROCHLOROTHIAZIDE 12.5 MG PO TABS: 12.5000 mg | ORAL_TABLET | Freq: Every day | ORAL | 2 refills | Status: DC

## 2023-11-30 MED ORDER — PREGABALIN 75 MG PO CAPS: 75.0000 mg | ORAL_CAPSULE | Freq: Two times a day (BID) | ORAL | 2 refills | Status: DC

## 2023-11-30 NOTE — Telephone Encounter (Signed)
 Copied from CRM [id number]. Topic: Clinical - Prescription Issue >> Nov 30, 2023  9:54 AM Rosaria A wrote: Reason for CRM: Patient was just seen at the office. Her pharmacy does not have her medication: pregabalin (LYRICA) 75 MG capsule. [facility] and patient is wanting to see if her PCP can transfer the medication to the new pharmacy. [id number] [place], [place] - [address] [place] [address] Phone: [phone] Fax: [phone] Hours: Not open 24 hours  Please call patient back.

## 2023-11-30 NOTE — Telephone Encounter (Signed)
 Patient states Lyrica prescription was sent today to the wrong pharmacy.  [facility] [place] [facility]?

## 2023-11-30 NOTE — Patient Instructions (Signed)
 Will refer to neurology. Start lyrica for pain twice a day  Start hydrochlorothiazide  for BP .

## 2023-11-30 NOTE — Progress Notes (Unsigned)
   Established Patient Office Visit  Subjective   Patient ID: Michelle Macias, female    DOB: 02/07/1990  Age: 33 y.o. MRN: [id number]  Chief Complaint  Patient presents with  . Medical Management of Chronic Issues    Elevated blood pressures    HPI  {History (Optional):23778}  ROS    Objective:     BP 122/88   Pulse 80   Ht 4' 11 (1.499 m)   Wt 160 lb (72.6 kg)   SpO2 99%   BMI 32.32 kg/m  {Vitals History (Optional):23777}  Physical Exam   No results found for any visits on 11/30/23.  {Labs (Optional):23779}  The ASCVD Risk score (Arnett DK, et al., 2019) failed to calculate for the following reasons:   The 2019 ASCVD risk score is only valid for ages 68 to 72    Assessment & Plan:   Problem List Items Addressed This Visit       Unprioritized   Paresthesia of both lower extremities - Primary    No follow-ups on file.    Asenath Balash, PA-C

## 2023-12-01 ENCOUNTER — Encounter: Payer: Self-pay | Admitting: Physician Assistant

## 2023-12-01 DIAGNOSIS — R251 Tremor, unspecified: Secondary | ICD-10-CM | POA: Insufficient documentation

## 2023-12-01 DIAGNOSIS — R030 Elevated blood-pressure reading, without diagnosis of hypertension: Secondary | ICD-10-CM | POA: Insufficient documentation

## 2023-12-01 DIAGNOSIS — G8929 Other chronic pain: Secondary | ICD-10-CM | POA: Insufficient documentation

## 2023-12-01 MED ORDER — PREGABALIN 75 MG PO CAPS: 75.0000 mg | ORAL_CAPSULE | Freq: Two times a day (BID) | ORAL | Status: AC

## 2023-12-09 ENCOUNTER — Telehealth: Admitting: Physician Assistant

## 2023-12-09 DIAGNOSIS — M549 Dorsalgia, unspecified: Secondary | ICD-10-CM

## 2023-12-09 NOTE — Patient Instructions (Signed)
  Michelle Macias, thank you for joining [name], PA-C for today's virtual visit.  While this provider is not your primary care provider (PCP), if your PCP is located in our provider database this encounter information will be shared with them immediately following your visit.   A [facility] MyChart account gives you access to today's visit and all your visits, tests, and labs performed at [facility]  click here if you don't have a [facility] MyChart account or go to [email or web address]  Consent: (Patient) Michelle Macias provided verbal consent for this virtual visit at the beginning of the encounter.  Current Medications:  Current Outpatient Medications:    albuterol  (VENTOLIN  HFA) 108 (90 Base) MCG/ACT inhaler, Inhale 2 puffs into the lungs every 4 (four) hours as needed for wheezing or shortness of breath., Disp: 8 g, Rfl: 2   hydrochlorothiazide  (HYDRODIURIL ) 12.5 MG tablet, Take 1 tablet (12.5 mg total) by mouth daily., Disp: 30 tablet, Rfl: 2   Multiple Vitamin (MULTIVITAMIN) tablet, Take 1 tablet by mouth daily., Disp: , Rfl:    ondansetron  (ZOFRAN -ODT) 4 MG disintegrating tablet, Take 1 tablet (4 mg total) by mouth every 8 (eight) hours as needed for nausea or vomiting., Disp: 20 tablet, Rfl: 0   pantoprazole  (PROTONIX ) 40 MG tablet, Take 1 tablet (40 mg total) by mouth daily., Disp: 90 tablet, Rfl: 3   pregabalin (LYRICA) 75 MG capsule, Take 1 capsule (75 mg total) by mouth 2 (two) times daily., Disp: , Rfl:    Pumpkin Seed-Soy Germ (AZO BLADDER CONTROL/GO-LESS PO), Take by mouth., Disp: , Rfl:    sertraline  (ZOLOFT ) 50 MG tablet, TAKE 1 TABLET BY MOUTH EVERY DAY, Disp: 90 tablet, Rfl: 1   Medications ordered in this encounter:  No orders of the defined types were placed in this encounter.    *If you need refills on other medications prior to your next appointment, please contact your pharmacy*  Follow-Up: Call back or seek an in-person evaluation  if the symptoms worsen or if the condition fails to improve as anticipated.  [facility] [phone]  Other Instructions Report to ER or urgent care for evaluation.    If you have been instructed to have an in-person evaluation today at a local Urgent Care facility, please use the link below. It will take you to a list of all of our available [facility], including address, phone number and hours of operation. Please do not delay care.  [facility]  If you or a family member do not have a primary care provider, use the link below to schedule a visit and establish care. When you choose a [facility] primary care physician or advanced practice provider, you gain a long-term partner in health. Find a Primary Care Provider  Learn more about [facility] in-office and virtual care options: [facility] - Get Care Now

## 2023-12-09 NOTE — Progress Notes (Signed)
 Virtual Visit Consent   Michelle Macias, you are scheduled for a virtual visit with a [facility] provider today. Just as with appointments in the office, your consent must be obtained to participate. Your consent will be active for this visit and any virtual visit you may have with one of our providers in the next 365 days. If you have a MyChart account, a copy of this consent can be sent to you electronically.  As this is a virtual visit, video technology does not allow for your provider to perform a traditional examination. This may limit your provider's ability to fully assess your condition. If your provider identifies any concerns that need to be evaluated in person or the need to arrange testing (such as labs, EKG, etc.), we will make arrangements to do so. Although advances in technology are sophisticated, we cannot ensure that it will always work on either your end or our end. If the connection with a video visit is poor, the visit may have to be switched to a telephone visit. With either a video or telephone visit, we are not always able to ensure that we have a secure connection.  By engaging in this virtual visit, you consent to the provision of healthcare and authorize for your insurance to be billed (if applicable) for the services provided during this visit. Depending on your insurance coverage, you may receive a charge related to this service.  I need to obtain your verbal consent now. Are you willing to proceed with your visit today? [name] has provided verbal consent on 12/09/2023 for a virtual visit (video or telephone). Teena Macias, [place]  Date: 12/09/2023 4:00 PM   Virtual Visit via Video Note   I, Michelle Macias, connected with  ENYAH MOMAN  ([id number], [birth year]) on 12/09/23 at  4:00 PM EST by a video-enabled telemedicine application and verified that I am speaking with the correct person using two identifiers.  Location: Patient: Virtual Visit  Location Patient: Home Provider: Virtual Visit Location Provider: Home Office   I discussed the limitations of evaluation and management by telemedicine and the availability of in person appointments. The patient expressed understanding and agreed to proceed.    History of Present Illness: Michelle Macias is a 33 y.o. who identifies as a female who was assigned female at birth, and is being seen today for back pain.  HPI: Back Pain This is a new problem. The current episode started in the past 7 days. The pain is present in the lumbar spine. The pain is mild. The pain is The same all the time. Associated symptoms include tingling. She has tried NSAIDs and muscle relaxant (lyirca) for the symptoms. The treatment provided no relief.    Problems:  Patient Active Problem List   Diagnosis Date Noted   Elevated blood pressure reading 12/01/2023   Tremor observed on examination 12/01/2023   Chronic neck pain 12/01/2023   Paresthesia of both lower extremities 11/30/2023   Localized swelling on right hand 09/21/2023   Numbness and tingling of right arm 09/21/2023   Positive ANA (antinuclear antibody) 09/21/2023   Gastroesophageal reflux disease 06/23/2023   Anxiety 06/22/2023   Trouble in sleeping 06/22/2023   Upper abdominal pain 02/23/2023   Hematochezia 02/23/2023   Fecal smearing 02/23/2023   Unexplained weight loss 02/23/2023   Loose stools 02/23/2023   Dizziness 05/29/2022   Lower respiratory infection 02/03/2022   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   History of ectopic pregnancy 01/17/2021  COVID-19 02/25/2020   Flushing 08/15/2019   Gastric ulcer 02/27/2019   Migraine without aura and without status migrainosus, not intractable 02/22/2019   Stress at home 10/04/2018   Panic attacks 10/04/2018   Right arm pain 02/17/2018   Anxiety and depression 12/09/2016   Outbursts of anger 12/09/2016   Stress reaction 12/09/2016   Vitamin D  deficiency 05/30/2014    Constipation 04/30/2014   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   Hx of umbilical hernia repair 04/24/2014    Allergies:  Allergies  Allergen Reactions   Contrast Media [Iodinated Contrast Media] Anaphylaxis    Started today 05/06/15   Medications:  Current Outpatient Medications:    albuterol  (VENTOLIN  HFA) 108 (90 Base) MCG/ACT inhaler, Inhale 2 puffs into the lungs every 4 (four) hours as needed for wheezing or shortness of breath., Disp: 8 g, Rfl: 2   hydrochlorothiazide  (HYDRODIURIL ) 12.5 MG tablet, Take 1 tablet (12.5 mg total) by mouth daily., Disp: 30 tablet, Rfl: 2   Multiple Vitamin (MULTIVITAMIN) tablet, Take 1 tablet by mouth daily., Disp: , Rfl:    ondansetron  (ZOFRAN -ODT) 4 MG disintegrating tablet, Take 1 tablet (4 mg total) by mouth every 8 (eight) hours as needed for nausea or vomiting., Disp: 20 tablet, Rfl: 0   pantoprazole  (PROTONIX ) 40 MG tablet, Take 1 tablet (40 mg total) by mouth daily., Disp: 90 tablet, Rfl: 3   pregabalin (LYRICA) 75 MG capsule, Take 1 capsule (75 mg total) by mouth 2 (two) times daily., Disp: , Rfl:    Pumpkin Seed-Soy Germ (AZO BLADDER CONTROL/GO-LESS PO), Take by mouth., Disp: , Rfl:    sertraline  (ZOLOFT ) 50 MG tablet, TAKE 1 TABLET BY MOUTH EVERY DAY, Disp: 90 tablet, Rfl: 1  Observations/Objective: Patient is well-developed, well-nourished in no acute distress.  Resting comfortably  at home.  Head is normocephalic, atraumatic.  No labored breathing.  Speech is clear and coherent with logical content.  Patient is alert and oriented at baseline.    Assessment and Plan: 1. Back pain, unspecified back location, unspecified back pain laterality, unspecified chronicity (Primary)  Offered anti-inflammatories which patient declined. Advised to report to ER for evaluation. No emergent conditions identified.  Follow Up Instructions: I discussed the assessment and treatment plan with the patient. The patient was provided an opportunity  to ask questions and all were answered. The patient agreed with the plan and demonstrated an understanding of the instructions.  A copy of instructions were sent to the patient via MyChart unless otherwise noted below.    The patient was advised to call back or seek an in-person evaluation if the symptoms worsen or if the condition fails to improve as anticipated.    [name], PA-C

## 2024-01-30 ENCOUNTER — Other Ambulatory Visit: Payer: Self-pay | Admitting: Physician Assistant

## 2024-01-30 DIAGNOSIS — F419 Anxiety disorder, unspecified: Secondary | ICD-10-CM

## 2024-02-25 ENCOUNTER — Other Ambulatory Visit: Payer: Self-pay | Admitting: Physician Assistant

## 2024-02-25 DIAGNOSIS — R030 Elevated blood-pressure reading, without diagnosis of hypertension: Secondary | ICD-10-CM

## 2024-05-02 ENCOUNTER — Ambulatory Visit: Admitting: Diagnostic Neuroimaging
